# Patient Record
Sex: Female | Born: 1955
Health system: Southern US, Community
[De-identification: ages and names within clinical notes are randomized; demographics above are authoritative.]

## PROBLEM LIST (undated history)

## (undated) DIAGNOSIS — J329 Chronic sinusitis, unspecified: Secondary | ICD-10-CM

## (undated) DIAGNOSIS — Z9109 Other allergy status, other than to drugs and biological substances: Secondary | ICD-10-CM

## (undated) DIAGNOSIS — K219 Gastro-esophageal reflux disease without esophagitis: Secondary | ICD-10-CM

## (undated) DIAGNOSIS — G43909 Migraine, unspecified, not intractable, without status migrainosus: Secondary | ICD-10-CM

## (undated) DIAGNOSIS — I10 Essential (primary) hypertension: Secondary | ICD-10-CM

## (undated) DIAGNOSIS — J45909 Unspecified asthma, uncomplicated: Secondary | ICD-10-CM

## (undated) HISTORY — DX: Gastro-esophageal reflux disease without esophagitis: K21.9

## (undated) HISTORY — DX: Chronic sinusitis, unspecified: J32.9

## (undated) HISTORY — DX: Essential (primary) hypertension: I10

## (undated) HISTORY — DX: Migraine, unspecified, not intractable, without status migrainosus: G43.909

## (undated) HISTORY — DX: Unspecified asthma, uncomplicated: J45.909

## (undated) HISTORY — DX: Other allergy status, other than to drugs and biological substances: Z91.09

---
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
## 1985-09-14 HISTORY — PX: AUGMENTATION MAMMAPLASTY: SUR837

## 2008-05-31 ENCOUNTER — Ambulatory Visit: Payer: Self-pay | Admitting: Internal Medicine

## 2009-04-14 ENCOUNTER — Ambulatory Visit: Payer: Self-pay | Admitting: Internal Medicine

## 2013-05-08 ENCOUNTER — Ambulatory Visit: Payer: Self-pay | Admitting: Internal Medicine

## 2014-09-21 ENCOUNTER — Ambulatory Visit: Payer: Self-pay | Admitting: Gastroenterology

## 2015-10-21 ENCOUNTER — Other Ambulatory Visit: Payer: Self-pay | Admitting: Nurse Practitioner

## 2015-10-21 DIAGNOSIS — Z1231 Encounter for screening mammogram for malignant neoplasm of breast: Secondary | ICD-10-CM

## 2015-12-02 ENCOUNTER — Ambulatory Visit: Payer: Self-pay | Attending: Nurse Practitioner

## 2015-12-17 ENCOUNTER — Other Ambulatory Visit: Payer: Self-pay | Admitting: Nurse Practitioner

## 2015-12-17 ENCOUNTER — Ambulatory Visit
Admission: RE | Admit: 2015-12-17 | Discharge: 2015-12-17 | Disposition: A | Discharge status: Home / Self Care | Payer: BLUE CROSS/BLUE SHIELD | Source: Ambulatory Visit | Attending: Nurse Practitioner | Admitting: Nurse Practitioner

## 2015-12-17 DIAGNOSIS — Z9882 Breast implant status: Secondary | ICD-10-CM | POA: Insufficient documentation

## 2015-12-17 DIAGNOSIS — Z1231 Encounter for screening mammogram for malignant neoplasm of breast: Secondary | ICD-10-CM | POA: Diagnosis not present

## 2016-01-09 DIAGNOSIS — J029 Acute pharyngitis, unspecified: Secondary | ICD-10-CM | POA: Diagnosis not present

## 2016-01-09 DIAGNOSIS — J019 Acute sinusitis, unspecified: Secondary | ICD-10-CM | POA: Diagnosis not present

## 2016-01-09 DIAGNOSIS — J209 Acute bronchitis, unspecified: Secondary | ICD-10-CM | POA: Diagnosis not present

## 2016-01-20 DIAGNOSIS — R1013 Epigastric pain: Secondary | ICD-10-CM | POA: Diagnosis not present

## 2016-02-25 DIAGNOSIS — R1013 Epigastric pain: Secondary | ICD-10-CM | POA: Diagnosis not present

## 2016-04-10 DIAGNOSIS — R1011 Right upper quadrant pain: Secondary | ICD-10-CM | POA: Diagnosis not present

## 2016-04-10 DIAGNOSIS — K219 Gastro-esophageal reflux disease without esophagitis: Secondary | ICD-10-CM | POA: Diagnosis not present

## 2016-04-24 DIAGNOSIS — M26602 Left temporomandibular joint disorder, unspecified: Secondary | ICD-10-CM | POA: Diagnosis not present

## 2016-05-13 DIAGNOSIS — H6093 Unspecified otitis externa, bilateral: Secondary | ICD-10-CM | POA: Diagnosis not present

## 2016-05-13 DIAGNOSIS — J019 Acute sinusitis, unspecified: Secondary | ICD-10-CM | POA: Diagnosis not present

## 2016-05-13 DIAGNOSIS — J309 Allergic rhinitis, unspecified: Secondary | ICD-10-CM | POA: Diagnosis not present

## 2016-05-28 DIAGNOSIS — H6093 Unspecified otitis externa, bilateral: Secondary | ICD-10-CM | POA: Diagnosis not present

## 2016-05-28 DIAGNOSIS — J309 Allergic rhinitis, unspecified: Secondary | ICD-10-CM | POA: Diagnosis not present

## 2016-05-28 DIAGNOSIS — J019 Acute sinusitis, unspecified: Secondary | ICD-10-CM | POA: Diagnosis not present

## 2016-06-15 DIAGNOSIS — D225 Melanocytic nevi of trunk: Secondary | ICD-10-CM | POA: Diagnosis not present

## 2016-06-15 DIAGNOSIS — D485 Neoplasm of uncertain behavior of skin: Secondary | ICD-10-CM | POA: Diagnosis not present

## 2016-06-15 DIAGNOSIS — C44519 Basal cell carcinoma of skin of other part of trunk: Secondary | ICD-10-CM | POA: Diagnosis not present

## 2016-07-04 DIAGNOSIS — J0100 Acute maxillary sinusitis, unspecified: Secondary | ICD-10-CM | POA: Diagnosis not present

## 2016-08-11 DIAGNOSIS — C44519 Basal cell carcinoma of skin of other part of trunk: Secondary | ICD-10-CM | POA: Diagnosis not present

## 2016-08-11 HISTORY — PX: OTHER SURGICAL HISTORY: SHX169

## 2016-08-11 HISTORY — PX: TUBAL LIGATION: SHX77

## 2016-08-20 DIAGNOSIS — Z0001 Encounter for general adult medical examination with abnormal findings: Secondary | ICD-10-CM | POA: Diagnosis not present

## 2016-08-20 DIAGNOSIS — G4700 Insomnia, unspecified: Secondary | ICD-10-CM | POA: Diagnosis not present

## 2016-08-20 DIAGNOSIS — J309 Allergic rhinitis, unspecified: Secondary | ICD-10-CM | POA: Diagnosis not present

## 2016-08-20 DIAGNOSIS — K219 Gastro-esophageal reflux disease without esophagitis: Secondary | ICD-10-CM | POA: Diagnosis not present

## 2016-08-21 ENCOUNTER — Other Ambulatory Visit: Payer: Self-pay | Admitting: Nurse Practitioner

## 2016-08-21 DIAGNOSIS — Z1231 Encounter for screening mammogram for malignant neoplasm of breast: Secondary | ICD-10-CM

## 2016-10-10 DIAGNOSIS — J029 Acute pharyngitis, unspecified: Secondary | ICD-10-CM | POA: Diagnosis not present

## 2016-10-15 DIAGNOSIS — H43812 Vitreous degeneration, left eye: Secondary | ICD-10-CM | POA: Diagnosis not present

## 2016-12-15 DIAGNOSIS — Z85828 Personal history of other malignant neoplasm of skin: Secondary | ICD-10-CM | POA: Diagnosis not present

## 2016-12-15 DIAGNOSIS — L820 Inflamed seborrheic keratosis: Secondary | ICD-10-CM | POA: Diagnosis not present

## 2016-12-22 ENCOUNTER — Ambulatory Visit
Admission: RE | Admit: 2016-12-22 | Discharge: 2016-12-22 | Disposition: A | Discharge status: Home / Self Care | Payer: BLUE CROSS/BLUE SHIELD | Source: Ambulatory Visit | Attending: Nurse Practitioner | Admitting: Nurse Practitioner

## 2016-12-22 DIAGNOSIS — Z1231 Encounter for screening mammogram for malignant neoplasm of breast: Secondary | ICD-10-CM | POA: Diagnosis not present

## 2017-02-16 DIAGNOSIS — K219 Gastro-esophageal reflux disease without esophagitis: Secondary | ICD-10-CM | POA: Diagnosis not present

## 2017-02-16 DIAGNOSIS — G4700 Insomnia, unspecified: Secondary | ICD-10-CM | POA: Diagnosis not present

## 2017-02-16 DIAGNOSIS — J309 Allergic rhinitis, unspecified: Secondary | ICD-10-CM | POA: Diagnosis not present

## 2017-09-21 ENCOUNTER — Ambulatory Visit (INDEPENDENT_AMBULATORY_CARE_PROVIDER_SITE_OTHER): Payer: BLUE CROSS/BLUE SHIELD | Admitting: Nurse Practitioner

## 2017-09-21 ENCOUNTER — Encounter: Payer: Self-pay | Admitting: Nurse Practitioner

## 2017-09-21 VITALS — BP 148/80 | HR 74 | Resp 16 | Ht 63.0 in | Wt 183.0 lb

## 2017-09-21 DIAGNOSIS — E559 Vitamin D deficiency, unspecified: Secondary | ICD-10-CM

## 2017-09-21 DIAGNOSIS — J309 Allergic rhinitis, unspecified: Secondary | ICD-10-CM | POA: Insufficient documentation

## 2017-09-21 DIAGNOSIS — J3481 Nasal mucositis (ulcerative): Secondary | ICD-10-CM | POA: Insufficient documentation

## 2017-09-21 DIAGNOSIS — N83201 Unspecified ovarian cyst, right side: Secondary | ICD-10-CM | POA: Insufficient documentation

## 2017-09-21 DIAGNOSIS — Z1239 Encounter for other screening for malignant neoplasm of breast: Secondary | ICD-10-CM

## 2017-09-21 DIAGNOSIS — R300 Dysuria: Secondary | ICD-10-CM | POA: Insufficient documentation

## 2017-09-21 DIAGNOSIS — K219 Gastro-esophageal reflux disease without esophagitis: Secondary | ICD-10-CM | POA: Insufficient documentation

## 2017-09-21 DIAGNOSIS — G4700 Insomnia, unspecified: Secondary | ICD-10-CM | POA: Insufficient documentation

## 2017-09-21 DIAGNOSIS — H6093 Unspecified otitis externa, bilateral: Secondary | ICD-10-CM | POA: Insufficient documentation

## 2017-09-21 DIAGNOSIS — Z0001 Encounter for general adult medical examination with abnormal findings: Secondary | ICD-10-CM | POA: Diagnosis not present

## 2017-09-21 DIAGNOSIS — I679 Cerebrovascular disease, unspecified: Secondary | ICD-10-CM | POA: Insufficient documentation

## 2017-09-21 DIAGNOSIS — B019 Varicella without complication: Secondary | ICD-10-CM | POA: Insufficient documentation

## 2017-09-21 DIAGNOSIS — B009 Herpesviral infection, unspecified: Secondary | ICD-10-CM | POA: Insufficient documentation

## 2017-09-21 DIAGNOSIS — Z124 Encounter for screening for malignant neoplasm of cervix: Secondary | ICD-10-CM

## 2017-09-21 DIAGNOSIS — Z1231 Encounter for screening mammogram for malignant neoplasm of breast: Secondary | ICD-10-CM

## 2017-09-21 DIAGNOSIS — J019 Acute sinusitis, unspecified: Secondary | ICD-10-CM | POA: Insufficient documentation

## 2017-09-21 DIAGNOSIS — R635 Abnormal weight gain: Secondary | ICD-10-CM | POA: Insufficient documentation

## 2017-09-21 DIAGNOSIS — N83202 Unspecified ovarian cyst, left side: Secondary | ICD-10-CM | POA: Insufficient documentation

## 2017-09-21 NOTE — Progress Notes (Signed)
[facility] [place], [place] [address]  Internal MEDICINE  Office Visit Note  Patient Name: Rafael Quesada  [id number]  [id number]  Date of Service: 09/21/2017     Complaints/HPI  The patient is here  For health maintenance exam. Today she states she is doing well and has no complaints.     Current Medication: Outpatient Encounter Medications as of 09/21/2017  Medication Sig  . desloratadine (CLARINEX) 5 MG tablet Take 5 mg by mouth daily.  Marland Kitchen esomeprazole (NEXIUM) 40 MG capsule Take 40 mg by mouth daily at 12 noon.  . fluticasone (FLONASE) 50 MCG/ACT nasal spray Place 1 spray into both nostrils 2 (two) times daily.  . sucralfate (CARAFATE) 1 g tablet Take 1 g by mouth 4 (four) times daily. Before meals and nightly.  . traZODone (DESYREL) 50 MG tablet Take 50 mg by mouth at bedtime as needed for sleep.   No facility-administered encounter medications on file as of 09/21/2017.     Surgical History: Past Surgical History:  Procedure Laterality Date  . AUGMENTATION MAMMAPLASTY Bilateral 1987  . [place]  . skin cancer   08/11/2016   removal  . TUBAL LIGATION  08/11/2016    Medical History: Past Medical History:  Diagnosis Date  . Asthma   . Environmental allergies   . Migraine   . Sinusitis     Family History: Family History  Problem Relation Age of Onset  . Breast cancer Cousin     Social History   Socioeconomic History  . Marital status: Married    Spouse name: Not on file  . Number of children: Not on file  . Years of education: Not on file  . Highest education level: Not on file  Social Needs  . Financial resource strain: Not on file  . Food insecurity - worry: Not on file  . Food insecurity - inability: Not on file  . Transportation needs - medical: Not on file  . Transportation needs - non-medical: Not on file  Occupational History  . Not on file  Tobacco Use  . Smoking status: Never Smoker  . Smokeless tobacco:  Never Used  Substance and Sexual Activity  . Alcohol use: No    Frequency: Never  . Drug use: No  . Sexual activity: Not on file  Other Topics Concern  . Not on file  Social History Narrative  . Not on file      Review of Systems  Constitutional: Negative for activity change, appetite change and fatigue.  HENT: Negative.   Respiratory: Negative for cough, chest tightness and wheezing.   Cardiovascular: Negative for chest pain and palpitations.  Gastrointestinal: Negative for constipation, diarrhea, nausea and vomiting.       Very well controlled acid reflux symptoms. Has figured out some of the triggers and avoids then as much as possible.   Endocrine: Negative.   Genitourinary: Negative.   Musculoskeletal: Negative.   Skin: Negative.   Allergic/Immunologic: Positive for environmental allergies.  Neurological: Negative for weakness and headaches.  Hematological: Negative.   Psychiatric/Behavioral: Negative for sleep disturbance. The patient is not nervous/anxious.     Today's Vitals   09/21/17 0943  BP: (!) 148/80  Pulse: 74  Resp: 16  SpO2: 97%  Weight: 183 lb (83 kg)  Height: 5\' 3"  (1.6 m)    Physical Exam  Constitutional: She is oriented to person, place, and time. She appears well-developed and well-nourished.  HENT:  Head: Normocephalic and atraumatic.  Eyes: Pupils are equal, round, and reactive to light.  Neck: Normal range of motion. Neck supple. No JVD present. Carotid bruit is not present. No thyromegaly present.  Cardiovascular: Normal rate, regular rhythm, normal heart sounds and normal pulses. PMI is not displaced.  Pulmonary/Chest: Effort normal and breath sounds normal. No accessory muscle usage. No respiratory distress. She has no wheezes. Right breast exhibits no inverted nipple, no mass, no nipple discharge, no skin change and no tenderness. Left breast exhibits no inverted nipple, no mass, no nipple discharge, no skin change and no tenderness.  Breasts are symmetrical.  Abdominal: Soft. Normal appearance and bowel sounds are normal. There is no tenderness.  Genitourinary: Rectum normal, vagina normal and uterus normal. Pelvic exam was performed with patient prone. There is no rash, tenderness, lesion or injury on the right labia. There is no rash, tenderness, lesion or injury on the left labia. Cervix exhibits no motion tenderness, no discharge and no friability. Right adnexum displays no mass, no tenderness and no fullness. Left adnexum displays no mass, no tenderness and no fullness.  Musculoskeletal: Normal range of motion.  Neurological: She is alert and oriented to person, place, and time. No cranial nerve deficit.  Skin: Skin is warm and dry.  Psychiatric: She has a normal mood and affect.  Nursing note and vitals reviewed.    Assessment/Plan:    ICD-10-CM   1. Encounter for routine adult health examination with abnormal findings Z00.01 Urinalysis, Routine w reflex microscopic    CBC with Differential/Platelet    Comprehensive metabolic panel    T4, free    TSH    Lipid panel    Pap IG and HPV (high risk) DNA detection  2. Gastro-esophageal reflux disease without esophagitis K21.9   3. Vitamin D deficiency E55.9 Vitamin D 1,25 dihydroxy  4. Screening for breast cancer Z12.31 MM Digital Screening  5. Screening for malignant neoplasm of cervix Z12.4 Pap IG and HPV (high risk) DNA detection   1. Annual wellness visit today. Routine, fasting labs ordered.  2. Continue nexium daily ad sucralfate as needed.  Refills to be provided as needed 3. Lab work ordredto check vitamin d. Will treat deficiency as indicated.  4. Screening mammogram ordered.  5. Pap smear obtained today.  She should follow up in 6 months and sooner if needed. General Counseling: I have discussed the findings of the evaluation and examination with Thayer Headings.  I have also discussed any further diagnostic evaluation that may be needed or ordered today. Jenai  verbalizes understanding of the findings of todays visit. We also reviewed her medications today. she has been encouraged to call the office with any questions or concerns that should arise related to todays visit.  This patient was seen by Leretha Pol, FNP- C in Collaboration with Dr Lavera Guise as a part of collaborative care agreement    Time spent:30 minutes    Dr Lavera Guise Internal medicine

## 2017-09-22 LAB — URINALYSIS, ROUTINE W REFLEX MICROSCOPIC
Bilirubin, UA: NEGATIVE
Glucose, UA: NEGATIVE
KETONES UA: NEGATIVE
LEUKOCYTES UA: NEGATIVE
NITRITE UA: NEGATIVE
PH UA: 7.5 (ref 5.0–7.5)
Protein, UA: NEGATIVE
RBC, UA: NEGATIVE
SPEC GRAV UA: 1.007 (ref 1.005–1.030)
Urobilinogen, Ur: 0.2 mg/dL (ref 0.2–1.0)

## 2017-09-23 LAB — PAP IG AND HPV HIGH-RISK
HPV, high-risk: NEGATIVE
PAP SMEAR COMMENT: 0

## 2017-09-24 ENCOUNTER — Other Ambulatory Visit: Payer: Self-pay | Admitting: Nurse Practitioner

## 2017-09-24 DIAGNOSIS — H109 Unspecified conjunctivitis: Secondary | ICD-10-CM

## 2017-09-24 MED ORDER — POLYMYXIN B-TRIMETHOPRIM 10000-0.1 UNIT/ML-% OP SOLN: 1.0000 [drp] | OPHTHALMIC | 0 refills | Status: AC

## 2017-09-24 NOTE — Progress Notes (Signed)
Sent polytrim eye drops to pharmacy. 1 drop every 4 hours for 7 days for conjunctivitis.

## 2017-09-27 ENCOUNTER — Telehealth: Payer: Self-pay

## 2017-09-27 NOTE — Telephone Encounter (Signed)
Called pt to let her know about polytrim 1 drop every 4 hours for 7 days.

## 2017-09-27 NOTE — Telephone Encounter (Signed)
-----   Message from Ronnell Freshwater, NP sent at 09/24/2017  5:28 PM EST ----- Regarding: RE: possible pink eye  Sent polytrim eye drops to pharmacy. 1 drop every 4 hours for 7 days for conjunctivitis.   ----- Message ----- From: Waynard Edwards Sent: 09/24/2017  12:30 PM To: Ronnell Freshwater, NP Subject: possible pink eye                              Pt thinks she has pink eye and was wondering if you can call her in something.

## 2017-09-28 DIAGNOSIS — E559 Vitamin D deficiency, unspecified: Secondary | ICD-10-CM | POA: Diagnosis not present

## 2017-09-28 DIAGNOSIS — Z0001 Encounter for general adult medical examination with abnormal findings: Secondary | ICD-10-CM | POA: Diagnosis not present

## 2017-10-03 LAB — CBC WITH DIFFERENTIAL/PLATELET
BASOS ABS: 0 10*3/uL (ref 0.0–0.2)
BASOS: 1 %
EOS (ABSOLUTE): 0.1 10*3/uL (ref 0.0–0.4)
Eos: 3 %
Hematocrit: 38.2 % (ref 34.0–46.6)
Hemoglobin: 12.9 g/dL (ref 11.1–15.9)
IMMATURE GRANS (ABS): 0 10*3/uL (ref 0.0–0.1)
IMMATURE GRANULOCYTES: 0 %
LYMPHS: 39 %
Lymphocytes Absolute: 1.6 10*3/uL (ref 0.7–3.1)
MCH: 30.5 pg (ref 26.6–33.0)
MCHC: 33.8 g/dL (ref 31.5–35.7)
MCV: 90 fL (ref 79–97)
MONOS ABS: 0.4 10*3/uL (ref 0.1–0.9)
Monocytes: 11 %
NEUTROS PCT: 46 %
Neutrophils Absolute: 1.9 10*3/uL (ref 1.4–7.0)
Platelets: 218 10*3/uL (ref 150–379)
RBC: 4.23 x10E6/uL (ref 3.77–5.28)
RDW: 14.1 % (ref 12.3–15.4)
WBC: 4.1 10*3/uL (ref 3.4–10.8)

## 2017-10-03 LAB — TSH: TSH: 2.75 u[IU]/mL (ref 0.450–4.500)

## 2017-10-03 LAB — COMPREHENSIVE METABOLIC PANEL
ALT: 16 IU/L (ref 0–32)
AST: 17 IU/L (ref 0–40)
Albumin/Globulin Ratio: 1.8 (ref 1.2–2.2)
Albumin: 4.4 g/dL (ref 3.6–4.8)
Alkaline Phosphatase: 64 IU/L (ref 39–117)
BUN/Creatinine Ratio: 17 (ref 12–28)
BUN: 15 mg/dL (ref 8–27)
Bilirubin Total: 0.4 mg/dL (ref 0.0–1.2)
CALCIUM: 9.3 mg/dL (ref 8.7–10.3)
CO2: 23 mmol/L (ref 20–29)
Chloride: 103 mmol/L (ref 96–106)
Creatinine, Ser: 0.86 mg/dL (ref 0.57–1.00)
GFR calc Af Amer: 83 mL/min/{1.73_m2} (ref >59)
GFR, EST NON AFRICAN AMERICAN: 72 mL/min/{1.73_m2} (ref >59)
GLUCOSE: 97 mg/dL (ref 65–99)
Globulin, Total: 2.5 g/dL (ref 1.5–4.5)
POTASSIUM: 4.5 mmol/L (ref 3.5–5.2)
Sodium: 140 mmol/L (ref 134–144)
TOTAL PROTEIN: 6.9 g/dL (ref 6.0–8.5)

## 2017-10-03 LAB — LIPID PANEL
CHOLESTEROL TOTAL: 204 mg/dL — AB (ref 100–199)
Chol/HDL Ratio: 2.8 ratio (ref 0.0–4.4)
HDL: 73 mg/dL (ref >39)
LDL CALC: 117 mg/dL — AB (ref 0–99)
TRIGLYCERIDES: 68 mg/dL (ref 0–149)
VLDL CHOLESTEROL CAL: 14 mg/dL (ref 5–40)

## 2017-10-03 LAB — VITAMIN D 1,25 DIHYDROXY
Vitamin D 1, 25 (OH)2 Total: 42 pg/mL
Vitamin D3 1, 25 (OH)2: 42 pg/mL

## 2017-10-03 LAB — T4, FREE: Free T4: 1.18 ng/dL (ref 0.82–1.77)

## 2017-10-06 NOTE — Progress Notes (Signed)
Paula [name] let patient know that her labs are good,

## 2017-11-10 ENCOUNTER — Other Ambulatory Visit: Payer: Self-pay

## 2017-11-10 MED ORDER — DESLORATADINE 5 MG PO TABS: 5.0000 mg | ORAL_TABLET | Freq: Every day | ORAL | 3 refills | Status: DC

## 2017-11-11 ENCOUNTER — Other Ambulatory Visit: Payer: Self-pay

## 2017-11-11 MED ORDER — DESLORATADINE 5 MG PO TABS: 5.0000 mg | ORAL_TABLET | Freq: Every day | ORAL | 3 refills | Status: DC

## 2017-12-01 ENCOUNTER — Other Ambulatory Visit: Payer: Self-pay

## 2017-12-01 ENCOUNTER — Encounter: Payer: Self-pay | Admitting: Emergency Medicine

## 2017-12-01 ENCOUNTER — Ambulatory Visit
Admission: EM | Admit: 2017-12-01 | Discharge: 2017-12-01 | Disposition: A | Discharge status: Home / Self Care | Payer: BLUE CROSS/BLUE SHIELD | Attending: Family Medicine | Admitting: Family Medicine

## 2017-12-01 DIAGNOSIS — R197 Diarrhea, unspecified: Secondary | ICD-10-CM

## 2017-12-01 DIAGNOSIS — R5383 Other fatigue: Secondary | ICD-10-CM

## 2017-12-01 DIAGNOSIS — A084 Viral intestinal infection, unspecified: Secondary | ICD-10-CM

## 2017-12-01 LAB — CBC WITH DIFFERENTIAL/PLATELET
BASOS ABS: 0 10*3/uL (ref 0–0.1)
Basophils Relative: 0 %
EOS PCT: 2 %
Eosinophils Absolute: 0.1 10*3/uL (ref 0–0.7)
HEMATOCRIT: 34.7 % — AB (ref 35.0–47.0)
Hemoglobin: 12 g/dL (ref 12.0–16.0)
LYMPHS ABS: 1.7 10*3/uL (ref 1.0–3.6)
LYMPHS PCT: 49 %
MCH: 31 pg (ref 26.0–34.0)
MCHC: 34.5 g/dL (ref 32.0–36.0)
MCV: 89.8 fL (ref 80.0–100.0)
MONO ABS: 0.4 10*3/uL (ref 0.2–0.9)
Monocytes Relative: 10 %
NEUTROS ABS: 1.4 10*3/uL (ref 1.4–6.5)
Neutrophils Relative %: 39 %
Platelets: 183 10*3/uL (ref 150–440)
RBC: 3.86 MIL/uL (ref 3.80–5.20)
RDW: 13.6 % (ref 11.5–14.5)
WBC: 3.6 10*3/uL (ref 3.6–11.0)

## 2017-12-01 LAB — COMPREHENSIVE METABOLIC PANEL
ALT: 38 U/L (ref 14–54)
AST: 33 U/L (ref 15–41)
Albumin: 3.7 g/dL (ref 3.5–5.0)
Alkaline Phosphatase: 60 U/L (ref 38–126)
Anion gap: 9 (ref 5–15)
BILIRUBIN TOTAL: 0.5 mg/dL (ref 0.3–1.2)
BUN: 13 mg/dL (ref 6–20)
CO2: 27 mmol/L (ref 22–32)
CREATININE: 0.78 mg/dL (ref 0.44–1.00)
Calcium: 8.7 mg/dL — ABNORMAL LOW (ref 8.9–10.3)
Chloride: 104 mmol/L (ref 101–111)
Glucose, Bld: 105 mg/dL — ABNORMAL HIGH (ref 65–99)
POTASSIUM: 4.2 mmol/L (ref 3.5–5.1)
Sodium: 140 mmol/L (ref 135–145)
TOTAL PROTEIN: 6.8 g/dL (ref 6.5–8.1)

## 2017-12-01 NOTE — ED Triage Notes (Signed)
Patient in today c/o fatigue, no energy, tired. Patient had diarrhea Friday and Saturday. Patient had chills over the weekend. Patient states she has slight stiffness in her neck and abdominal pain.

## 2017-12-01 NOTE — ED Provider Notes (Signed)
[facility]    CSN: [id number] Arrival date & time: 12/01/17  1512     History   Chief Complaint Chief Complaint  Patient presents with  . Fatigue    HPI Paula [name] is a 62 y.o. female.   62 yo female with a c/o fatigue, low energy, after 3 days of watery diarrhea, chills, and crampy abdominal pain. States diarrhea resolved yesterday. Denies any vomiting, melena, hematochezia.    The history is provided by the patient.    Past Medical History:  Diagnosis Date  . Asthma   . Environmental allergies   . Migraine   . Sinusitis     Patient Active Problem List   Diagnosis Date Noted  . Acute sinusitis, unspecified 09/21/2017  . Unspecified otitis externa, bilateral 09/21/2017  . Gastro-esophageal reflux disease without esophagitis 09/21/2017  . Insomnia, unspecified 09/21/2017  . Unspecified ovarian cyst, right side 09/21/2017  . Unspecified ovarian cyst, left side 09/21/2017  . Nasal mucositis (ulcerative) 09/21/2017  . Allergic rhinitis, unspecified 09/21/2017  . Cerebrovascular disease, unspecified 09/21/2017  . Herpesviral infection, unspecified 09/21/2017  . Varicella without complication 16/60/6301  . Dysuria 09/21/2017  . Abnormal weight gain 09/21/2017    Past Surgical History:  Procedure Laterality Date  . AUGMENTATION MAMMAPLASTY Bilateral 1987  . [place]  . skin cancer   08/11/2016   removal  . TUBAL LIGATION  08/11/2016    OB History    No data available       Home Medications    Prior to Admission medications   Medication Sig Start Date End Date Taking? Authorizing Provider  esomeprazole (NEXIUM) 40 MG capsule Take 40 mg by mouth daily at 12 noon.   Yes [provider]  sucralfate (CARAFATE) 1 g tablet Take 1 g by mouth 4 (four) times daily. Before meals and nightly.   Yes [provider]  traZODone (DESYREL) 50 MG tablet Take 50 mg by mouth at bedtime as needed for sleep.   Yes [provider]  desloratadine (CLARINEX) 5 MG tablet Take 1 tablet (5 mg total) by mouth daily. 11/11/17   Ronnell Freshwater, NP  fluticasone (FLONASE) 50 MCG/ACT nasal spray Place 1 spray into both nostrils 2 (two) times daily.    [provider]    Family History Family History  Problem Relation Age of Onset  . Breast cancer Cousin   . COPD Mother   . Ulcers Father     Social History Social History   Tobacco Use  . Smoking status: Former Smoker    Last attempt to quit: 12/01/2004    Years since quitting: 13.0  . Smokeless tobacco: Never Used  Substance Use Topics  . Alcohol use: No    Frequency: Never  . Drug use: No     Allergies   Patient has no known allergies.   Review of Systems Review of Systems   Physical Exam Triage Vital Signs ED Triage Vitals [12/01/17 1535]  Enc Vitals Group     BP (!) 158/65     Pulse Rate 68     Resp 16     Temp 97.9 F (36.6 C)     Temp Source Oral     SpO2 98 %     Weight 180 lb (81.6 kg)     Height 5\' 3"  (1.6 m)     Head Circumference      Peak Flow      Pain Score 0  Pain Loc      Pain Edu?      Excl. in [place]?    No data found.  Updated Vital Signs BP (!) 158/65 (BP Location: Left Arm)   Pulse 68   Temp 97.9 F (36.6 C) (Oral)   Resp 16   Ht 5\' 3"  (1.6 m)   Wt 180 lb (81.6 kg)   SpO2 98%   BMI 31.89 kg/m   Visual Acuity Right Eye Distance:   Left Eye Distance:   Bilateral Distance:    Right Eye Near:   Left Eye Near:    Bilateral Near:     Physical Exam  Constitutional: She appears well-developed and well-nourished. No distress.  Abdominal: Soft. Bowel sounds are normal. She exhibits no distension and no mass. There is tenderness (mild, diffuse). There is no rebound and no guarding.  Skin: She is not diaphoretic.  Nursing note and vitals reviewed.    UC Treatments / Results  Labs (all labs ordered are listed, but only abnormal results are displayed) Labs Reviewed  CBC WITH  DIFFERENTIAL/PLATELET - Abnormal; Notable for the following components:      Result Value   HCT 34.7 (*)    All other components within normal limits  COMPREHENSIVE METABOLIC PANEL - Abnormal; Notable for the following components:   Glucose, Bld 105 (*)    Calcium 8.7 (*)    All other components within normal limits    EKG  EKG Interpretation None       Radiology No results found.  Procedures Procedures (including critical care time)  Medications Ordered in UC Medications - No data to display   Initial Impression / Assessment and Plan / UC Course  I have reviewed the triage vital signs and the nursing notes.  Pertinent labs & imaging results that were available during my care of the patient were reviewed by me and considered in my medical decision making (see chart for details).       Final Clinical Impressions(s) / UC Diagnoses   Final diagnoses:  Viral gastroenteritis    ED Discharge Orders    None     1. Lab results and diagnosis reviewed with patient 2. Recommend supportive treatment with increased fluids/clear liquids then advance slowly as tolerated; otc meds 3. Follow-up prn if symptoms worsen or don't improve   Controlled Substance Prescriptions [place] Controlled Substance Registry consulted? Not Applicable   Norval Gable, MD 12/01/17 1739

## 2018-02-07 DIAGNOSIS — J0100 Acute maxillary sinusitis, unspecified: Secondary | ICD-10-CM | POA: Diagnosis not present

## 2018-02-18 ENCOUNTER — Other Ambulatory Visit: Payer: Self-pay

## 2018-02-18 MED ORDER — TRAZODONE HCL 50 MG PO TABS: 50.0000 mg | ORAL_TABLET | Freq: Every evening | ORAL | 3 refills | Status: DC | PRN

## 2018-05-19 ENCOUNTER — Other Ambulatory Visit: Payer: Self-pay | Admitting: Nurse Practitioner

## 2018-05-19 ENCOUNTER — Telehealth: Payer: Self-pay

## 2018-05-19 DIAGNOSIS — N390 Urinary tract infection, site not specified: Secondary | ICD-10-CM

## 2018-05-19 DIAGNOSIS — R300 Dysuria: Secondary | ICD-10-CM

## 2018-05-19 MED ORDER — PHENAZOPYRIDINE HCL 200 MG PO TABS: 200.0000 mg | ORAL_TABLET | Freq: Three times a day (TID) | ORAL | 0 refills | Status: DC | PRN

## 2018-05-19 MED ORDER — CIPROFLOXACIN HCL 500 MG PO TABS: 500.0000 mg | ORAL_TABLET | Freq: Two times a day (BID) | ORAL | 0 refills | Status: DC

## 2018-05-19 NOTE — Telephone Encounter (Signed)
Pt advised we send antibiotic and pyridium to [facility]

## 2018-05-19 NOTE — Progress Notes (Signed)
Due to c/o uti, sent prescriptions for cipro twice daily for 10 days and pyridium 200mg  which may be taken up to3 times daily as needed for bladder pain/spasms. Both sent to [facility].

## 2018-05-19 NOTE — Telephone Encounter (Signed)
Due to c/o uti, sent prescriptions for cipro twice daily for 10 days and pyridium 200mg  which may be taken up to3 times daily as needed for bladder pain/spasms. Both sent to [facility].

## 2018-08-18 ENCOUNTER — Ambulatory Visit: Payer: BLUE CROSS/BLUE SHIELD | Admitting: Nurse Practitioner

## 2018-08-18 ENCOUNTER — Encounter: Payer: Self-pay | Admitting: Adult Health

## 2018-08-18 VITALS — BP 145/68 | HR 86 | Temp 97.9°F | Resp 16 | Ht 63.0 in | Wt 185.8 lb

## 2018-08-18 DIAGNOSIS — J069 Acute upper respiratory infection, unspecified: Secondary | ICD-10-CM | POA: Diagnosis not present

## 2018-08-18 MED ORDER — AMOXICILLIN 875 MG PO TABS: 875.0000 mg | ORAL_TABLET | Freq: Two times a day (BID) | ORAL | 0 refills | Status: DC

## 2018-08-18 NOTE — Progress Notes (Signed)
[facility] [place], [place] [address]  Internal MEDICINE  Office Visit Note  Patient Name: Paula [name]  [id number]  [id number]  Date of Service: 08/24/2018   Pt is here for a sick visit.  Chief Complaint  Patient presents with  . Ear Problem    Pain in ears nose, throat, symptoms started on sunday  . Sinusitis  . Cough  . Chills     The patient is here fr sick visit. Today, she is complaining of nasal congestion, headache, sore throat, cough, and ear pain. Symptoms have been present for about a week. She has been using over the counter medications without improvement. She has reduced appetite and nausea with no vomiting.        Current Medication:  Outpatient Encounter Medications as of 08/18/2018  Medication Sig  . desloratadine (CLARINEX) 5 MG tablet Take 1 tablet (5 mg total) by mouth daily.  Marland Kitchen esomeprazole (NEXIUM) 40 MG capsule Take 40 mg by mouth daily at 12 noon.  . fluticasone (FLONASE) 50 MCG/ACT nasal spray Place 1 spray into both nostrils 2 (two) times daily.  . phenazopyridine (PYRIDIUM) 200 MG tablet Take 1 tablet (200 mg total) by mouth 3 (three) times daily as needed for pain.  Marland Kitchen sucralfate (CARAFATE) 1 g tablet Take 1 g by mouth 4 (four) times daily. Before meals and nightly.  . traZODone (DESYREL) 50 MG tablet Take 1 tablet (50 mg total) by mouth at bedtime as needed for sleep.  Marland Kitchen amoxicillin (AMOXIL) 875 MG tablet Take 1 tablet (875 mg total) by mouth 2 (two) times daily.  . ciprofloxacin (CIPRO) 500 MG tablet Take 1 tablet (500 mg total) by mouth 2 (two) times daily. (Patient not taking: Reported on 08/18/2018)   No facility-administered encounter medications on file as of 08/18/2018.       Medical History: Past Medical History:  Diagnosis Date  . Asthma   . Environmental allergies   . Migraine   . Sinusitis      Today's Vitals   08/18/18 1213  BP: (!) 145/68  Pulse: 86  Resp: 16  Temp: 97.9 F (36.6 C)   SpO2: 95%  Weight: 185 lb 12.8 oz (84.3 kg)  Height: 5\' 3"  (1.6 m)    Review of Systems  Constitutional: Positive for chills, fatigue and fever.  HENT: Positive for congestion, ear pain, postnasal drip, rhinorrhea, sinus pain, sore throat and voice change.   Respiratory: Positive for cough. Negative for wheezing.   Cardiovascular: Negative for chest pain and palpitations.  Gastrointestinal: Positive for nausea.  Musculoskeletal: Positive for myalgias.  Allergic/Immunologic: Positive for environmental allergies.  Neurological: Positive for headaches.    Physical Exam  Constitutional: She is oriented to person, place, and time. She appears well-developed and well-nourished. No distress.  HENT:  Head: Normocephalic and atraumatic.  Right Ear: Tympanic membrane is erythematous and bulging.  Left Ear: Tympanic membrane is erythematous and bulging.  Nose: Rhinorrhea present. Right sinus exhibits maxillary sinus tenderness and frontal sinus tenderness. Left sinus exhibits maxillary sinus tenderness and frontal sinus tenderness.  Mouth/Throat: Posterior oropharyngeal edema and posterior oropharyngeal erythema present. No oropharyngeal exudate.  Eyes: Pupils are equal, round, and reactive to light. EOM are normal.  Neck: Normal range of motion. Neck supple. No JVD present. No tracheal deviation present. No thyromegaly present.  Cardiovascular: Normal rate, regular rhythm and normal heart sounds. Exam reveals no gallop and no friction rub.  No murmur heard. Pulmonary/Chest: Effort normal and breath sounds  normal. No respiratory distress. She has no wheezes. She has no rales. She exhibits no tenderness.  Abdominal: Soft. Bowel sounds are normal. There is no tenderness.  Musculoskeletal: Normal range of motion.  Lymphadenopathy:    She has cervical adenopathy.  Neurological: She is alert and oriented to person, place, and time. No cranial nerve deficit.  Skin: Skin is warm and dry. She is not  diaphoretic.  Psychiatric: She has a normal mood and affect. Her behavior is normal. Judgment and thought content normal.  Nursing note and vitals reviewed.  Assessment/Plan: 1. Acute upper respiratory infection Start amoxicillin 875mg  twice daily for 10 days. Rest and increase fluids. Continue taking OTC medication as needed and as indicated to alleviate symptoms  - amoxicillin (AMOXIL) 875 MG tablet; Take 1 tablet (875 mg total) by mouth 2 (two) times daily.  Dispense: 20 tablet; Refill: 0  General Counseling: Kerline verbalizes understanding of the findings of todays visit and agrees with plan of treatment. I have discussed any further diagnostic evaluation that may be needed or ordered today. We also reviewed her medications today. she has been encouraged to call the office with any questions or concerns that should arise related to todays visit.    Counseling:  Rest and increase fluids. Continue using OTC medication to control symptoms.   This patient was seen by [place] with Dr Lavera Guise as a part of collaborative care agreement  Meds ordered this encounter  Medications  . amoxicillin (AMOXIL) 875 MG tablet    Sig: Take 1 tablet (875 mg total) by mouth 2 (two) times daily.    Dispense:  20 tablet    Refill:  0    Order Specific Question:   Supervising Provider    Answer:   Lavera Guise [[id number]]    Time spent: 15 Minutes

## 2018-08-24 DIAGNOSIS — J069 Acute upper respiratory infection, unspecified: Secondary | ICD-10-CM | POA: Insufficient documentation

## 2018-09-22 ENCOUNTER — Ambulatory Visit (INDEPENDENT_AMBULATORY_CARE_PROVIDER_SITE_OTHER): Payer: BLUE CROSS/BLUE SHIELD | Admitting: Nurse Practitioner

## 2018-09-22 ENCOUNTER — Encounter: Payer: Self-pay | Admitting: Nurse Practitioner

## 2018-09-22 VITALS — BP 132/82 | HR 65 | Resp 16 | Ht 63.0 in | Wt 185.8 lb

## 2018-09-22 DIAGNOSIS — F5101 Primary insomnia: Secondary | ICD-10-CM

## 2018-09-22 DIAGNOSIS — Z0001 Encounter for general adult medical examination with abnormal findings: Secondary | ICD-10-CM | POA: Diagnosis not present

## 2018-09-22 DIAGNOSIS — R300 Dysuria: Secondary | ICD-10-CM

## 2018-09-22 DIAGNOSIS — Z85820 Personal history of malignant melanoma of skin: Secondary | ICD-10-CM

## 2018-09-22 DIAGNOSIS — J309 Allergic rhinitis, unspecified: Secondary | ICD-10-CM

## 2018-09-22 DIAGNOSIS — K219 Gastro-esophageal reflux disease without esophagitis: Secondary | ICD-10-CM

## 2018-09-22 DIAGNOSIS — R5383 Other fatigue: Secondary | ICD-10-CM

## 2018-09-22 DIAGNOSIS — G4489 Other headache syndrome: Secondary | ICD-10-CM

## 2018-09-22 DIAGNOSIS — Z1239 Encounter for other screening for malignant neoplasm of breast: Secondary | ICD-10-CM

## 2018-09-22 DIAGNOSIS — E559 Vitamin D deficiency, unspecified: Secondary | ICD-10-CM

## 2018-09-22 DIAGNOSIS — B001 Herpesviral vesicular dermatitis: Secondary | ICD-10-CM

## 2018-09-22 MED ORDER — ACYCLOVIR 5 % EX OINT: 1.0000 "application " | TOPICAL_OINTMENT | CUTANEOUS | 3 refills | Status: DC

## 2018-09-22 MED ORDER — ASPIRIN-ACETAMINOPHEN-CAFFEINE 250-250-65 MG PO TABS: 1.0000 | ORAL_TABLET | Freq: Four times a day (QID) | ORAL | 2 refills | Status: DC | PRN

## 2018-09-22 MED ORDER — FLUTICASONE PROPIONATE 50 MCG/ACT NA SUSP: 1.0000 | Freq: Two times a day (BID) | NASAL | 5 refills | Status: DC

## 2018-09-22 MED ORDER — TRAZODONE HCL 50 MG PO TABS: 50.0000 mg | ORAL_TABLET | Freq: Every evening | ORAL | 5 refills | Status: DC | PRN

## 2018-09-22 MED ORDER — SUCRALFATE 1 G PO TABS: 1.0000 g | ORAL_TABLET | Freq: Four times a day (QID) | ORAL | 5 refills | Status: DC

## 2018-09-22 MED ORDER — ACYCLOVIR 400 MG PO TABS: 400.0000 mg | ORAL_TABLET | Freq: Four times a day (QID) | ORAL | 3 refills | Status: DC

## 2018-09-22 MED ORDER — DESLORATADINE 5 MG PO TABS: 5.0000 mg | ORAL_TABLET | Freq: Every day | ORAL | 3 refills | Status: DC

## 2018-09-22 MED ORDER — ESOMEPRAZOLE MAGNESIUM 40 MG PO CPDR: 40.0000 mg | DELAYED_RELEASE_CAPSULE | Freq: Every day | ORAL | 5 refills | Status: DC

## 2018-09-22 NOTE — Progress Notes (Signed)
[facility] [place],  [address]  Internal MEDICINE  Office Visit Note  Patient Name: Paula [name]  [id number]  [id number]  Date of Service: 09/23/2018   Pt is here for routine health maintenance examination   Chief Complaint  Patient presents with  . Annual Exam  . Quality Metric Gaps    pt does not plan on getting the flu vaccine     The patient is here for health maintenance exam. Today, she is feeling well. Does have some pressure in the ears. Recently treated for sinus infection. Symptoms have subsided except for the ear pressure. She is going on a cruise on Saturday and wants to make sure there is no persistent infection. She is due to have routine, fasting labs as well as mammogram. She does need to have refills of all medications .   Current Medication: Outpatient Encounter Medications as of 09/22/2018  Medication Sig  . desloratadine (CLARINEX) 5 MG tablet Take 1 tablet (5 mg total) by mouth daily.  Marland Kitchen esomeprazole (NEXIUM) 40 MG capsule Take 1 capsule (40 mg total) by mouth daily at 12 noon.  . fluticasone (FLONASE) 50 MCG/ACT nasal spray Place 1 spray into both nostrils 2 (two) times daily.  . phenazopyridine (PYRIDIUM) 200 MG tablet Take 1 tablet (200 mg total) by mouth 3 (three) times daily as needed for pain.  Marland Kitchen sucralfate (CARAFATE) 1 g tablet Take 1 tablet (1 g total) by mouth 4 (four) times daily.  . traZODone (DESYREL) 50 MG tablet Take 1 tablet (50 mg total) by mouth at bedtime as needed for sleep.  . [DISCONTINUED] desloratadine (CLARINEX) 5 MG tablet Take 1 tablet (5 mg total) by mouth daily.  . [DISCONTINUED] esomeprazole (NEXIUM) 40 MG capsule Take 40 mg by mouth daily at 12 noon.  . [DISCONTINUED] fluticasone (FLONASE) 50 MCG/ACT nasal spray Place 1 spray into both nostrils 2 (two) times daily.  . [DISCONTINUED] sucralfate (CARAFATE) 1 g tablet Take 1 g by mouth 4 (four) times daily. Before meals and nightly.  .  [DISCONTINUED] traZODone (DESYREL) 50 MG tablet Take 1 tablet (50 mg total) by mouth at bedtime as needed for sleep.  . Acetaminophen 500 MG coapsule Take by mouth.  Marland Kitchen acyclovir (ZOVIRAX) 400 MG tablet Take 1 tablet (400 mg total) by mouth 4 (four) times daily.  Marland Kitchen acyclovir ointment (ZOVIRAX) 5 % Apply 1 application topically every 3 (three) hours.  Marland Kitchen aspirin-acetaminophen-caffeine (EXCEDRIN MIGRAINE) 250-250-65 MG tablet Take 1 tablet by mouth every 6 (six) hours as needed for headache.  . [DISCONTINUED] amoxicillin (AMOXIL) 875 MG tablet Take 1 tablet (875 mg total) by mouth 2 (two) times daily. (Patient not taking: Reported on 09/22/2018)  . [DISCONTINUED] aspirin-acetaminophen-caffeine (EXCEDRIN MIGRAINE) 250-250-65 MG tablet Take by mouth.  . [DISCONTINUED] ciprofloxacin (CIPRO) 500 MG tablet Take 1 tablet (500 mg total) by mouth 2 (two) times daily. (Patient not taking: Reported on 08/18/2018)   No facility-administered encounter medications on file as of 09/22/2018.     Surgical History: Past Surgical History:  Procedure Laterality Date  . AUGMENTATION MAMMAPLASTY Bilateral 1987  . [place]  . skin cancer   08/11/2016   removal  . TUBAL LIGATION  08/11/2016    Medical History: Past Medical History:  Diagnosis Date  . Asthma   . Environmental allergies   . Migraine   . Sinusitis     Family History: Family History  Problem Relation Age of Onset  . Breast cancer Cousin   .  COPD Mother   . Ulcers Father       Review of Systems  Constitutional: Negative for activity change, appetite change and fatigue.  HENT: Positive for congestion.        Ears feel full and congested.   Respiratory: Negative for cough, chest tightness and wheezing.   Cardiovascular: Negative for chest pain and palpitations.  Gastrointestinal: Negative for constipation, diarrhea, nausea and vomiting.       Very well controlled acid reflux symptoms. Has figured out some of the triggers and  avoids then as much as possible.   Endocrine: Negative for cold intolerance, heat intolerance, polydipsia and polyuria.  Genitourinary: Negative for dysuria, frequency and urgency.  Musculoskeletal: Negative for arthralgias and myalgias.  Skin: Negative.   Allergic/Immunologic: Positive for environmental allergies.  Neurological: Negative for dizziness, weakness and headaches.  Hematological: Negative for adenopathy.  Psychiatric/Behavioral: Negative for sleep disturbance. The patient is not nervous/anxious.      Today's Vitals   09/22/18 0839  BP: 132/82  Pulse: 65  Resp: 16  SpO2: 98%  Weight: 185 lb 12.8 oz (84.3 kg)  Height: 5\' 3"  (1.6 m)    Physical Exam Vitals signs and nursing note reviewed.  Constitutional:      Appearance: Normal appearance. She is well-developed.  HENT:     Head: Normocephalic and atraumatic.     Right Ear: Ear canal normal.     Left Ear: Ear canal normal.     Nose: Nose normal.     Mouth/Throat:     Mouth: Mucous membranes are moist.     Pharynx: Oropharynx is clear.  Eyes:     Conjunctiva/sclera: Conjunctivae normal.     Pupils: Pupils are equal, round, and reactive to light.  Neck:     Musculoskeletal: Normal range of motion and neck supple.     Thyroid: No thyromegaly.     Vascular: No carotid bruit or JVD.  Cardiovascular:     Rate and Rhythm: Normal rate and regular rhythm.     Chest Wall: PMI is not displaced.     Pulses: Normal pulses.     Heart sounds: Normal heart sounds.  Pulmonary:     Effort: Pulmonary effort is normal. No accessory muscle usage or respiratory distress.     Breath sounds: Normal breath sounds. No wheezing.  Chest:     Breasts: Breasts are symmetrical.        Right: Normal. No swelling, bleeding, mass, nipple discharge, skin change or tenderness.        Left: Normal. No swelling, bleeding, inverted nipple, mass, nipple discharge, skin change or tenderness.  Abdominal:     General: Bowel sounds are normal.      Palpations: Abdomen is soft.     Tenderness: There is no abdominal tenderness.  Musculoskeletal: Normal range of motion.  Skin:    General: Skin is warm and dry.  Neurological:     General: No focal deficit present.     Mental Status: She is alert and oriented to person, place, and time.     Cranial Nerves: No cranial nerve deficit.  Psychiatric:        Mood and Affect: Mood normal.        Behavior: Behavior normal.        Thought Content: Thought content normal.        Judgment: Judgment normal.      LABS: Recent Results (from the past 2160 hour(s))  UA/M w/rflx Culture, Routine  Status: None   Collection Time: 09/22/18  8:51 AM  Result Value Ref Range   Specific Gravity, UA 1.021 1.005 - 1.030   pH, UA 7.5 5.0 - 7.5   Color, UA Yellow Yellow   Appearance Ur Clear Clear   Leukocytes, UA Negative Negative   Protein, UA Negative Negative/Trace   Glucose, UA Negative Negative   Ketones, UA Negative Negative   RBC, UA Negative Negative   Bilirubin, UA Negative Negative   Urobilinogen, Ur 0.2 0.2 - 1.0 mg/dL   Nitrite, UA Negative Negative   Microscopic Examination Comment     Comment: Microscopic follows if indicated.   Microscopic Examination See below:     Comment: Microscopic was indicated and was performed.   Urinalysis Reflex Comment     Comment: This specimen will not reflex to a Urine Culture.  Microscopic Examination     Status: None   Collection Time: 09/22/18  8:51 AM  Result Value Ref Range   WBC, UA None seen 0 - 5 /hpf   RBC, UA 0-2 0 - 2 /hpf   Epithelial Cells (non renal) 0-10 0 - 10 /hpf   Casts None seen None seen /lpf   Mucus, UA Present Not Estab.   Bacteria, UA None seen None seen/Few    Assessment/Plan: 1. Encounter for routine adult health examination with abnormal findings Annual health maintenance exam today. Routine labs ordered.  - CBC with Differential/Platelet - Comprehensive metabolic panel - T4, free - TSH - Lipid panel  2.  Gastro-esophageal reflux disease without esophagitis Continue nexium every day and use sucralfate as needed.  - esomeprazole (NEXIUM) 40 MG capsule; Take 1 capsule (40 mg total) by mouth daily at 12 noon.  Dispense: 30 capsule; Refill: 5 - sucralfate (CARAFATE) 1 g tablet; Take 1 tablet (1 g total) by mouth 4 (four) times daily.  Dispense: 120 tablet; Refill: 5  3. Allergic rhinitis, unspecified seasonality, unspecified trigger - desloratadine (CLARINEX) 5 MG tablet; Take 1 tablet (5 mg total) by mouth daily.  Dispense: 30 tablet; Refill: 3 - fluticasone (FLONASE) 50 MCG/ACT nasal spray; Place 1 spray into both nostrils 2 (two) times daily.  Dispense: 16 g; Refill: 5  4. Other fatigue Check labs - T4, free - TSH  5. Headache syndrome - aspirin-acetaminophen-caffeine (EXCEDRIN MIGRAINE) 250-250-65 MG tablet; Take 1 tablet by mouth every 6 (six) hours as needed for headache.  Dispense: 45 tablet; Refill: 2  6. Vitamin D deficiency - Vitamin D 1,25 dihydroxy  7. Fever blister When outbreak occurs, start acyclovir 400mg  four times daily for 5 days. May also use acyclovir ointment as needed  - acyclovir ointment (ZOVIRAX) 5 %; Apply 1 application topically every 3 (three) hours.  Dispense: 15 g; Refill: 3 - acyclovir (ZOVIRAX) 400 MG tablet; Take 1 tablet (400 mg total) by mouth 4 (four) times daily.  Dispense: 20 tablet; Refill: 3  8. History of malignant melanoma of skin Refer to dermatology for continued evaluation and treatment.  - Ambulatory referral to Dermatology  9. Primary insomnia Use trazodone as needed and as prescribed  - traZODone (DESYREL) 50 MG tablet; Take 1 tablet (50 mg total) by mouth at bedtime as needed for sleep.  Dispense: 30 tablet; Refill: 5  10. Screening for breast cancer - MM DIGITAL SCREENING BILATERAL; Future  11. Dysuria - UA/M w/rflx Culture, Routine  General Counseling: zarria towell understanding of the findings of todays visit and agrees with  plan of treatment. I have discussed any further  diagnostic evaluation that may be needed or ordered today. We also reviewed her medications today. she has been encouraged to call the office with any questions or concerns that should arise related to todays visit.    Counseling:  This patient was seen by Leretha Pol FNP Collaboration with Dr Lavera Guise as a part of collaborative care agreement  Orders Placed This Encounter  Procedures  . Microscopic Examination  . MM DIGITAL SCREENING BILATERAL  . UA/M w/rflx Culture, Routine  . CBC with Differential/Platelet  . Comprehensive metabolic panel  . T4, free  . TSH  . Lipid panel  . Vitamin D 1,25 dihydroxy  . Ambulatory referral to Dermatology    Meds ordered this encounter  Medications  . aspirin-acetaminophen-caffeine (EXCEDRIN MIGRAINE) 250-250-65 MG tablet    Sig: Take 1 tablet by mouth every 6 (six) hours as needed for headache.    Dispense:  45 tablet    Refill:  2    Order Specific Question:   Supervising Provider    Answer:   Lavera Guise [[id number]]  . desloratadine (CLARINEX) 5 MG tablet    Sig: Take 1 tablet (5 mg total) by mouth daily.    Dispense:  30 tablet    Refill:  3    Order Specific Question:   Supervising Provider    Answer:   Lavera Guise [[id number]]  . esomeprazole (NEXIUM) 40 MG capsule    Sig: Take 1 capsule (40 mg total) by mouth daily at 12 noon.    Dispense:  30 capsule    Refill:  5    Order Specific Question:   Supervising Provider    Answer:   Lavera Guise [[id number]]  . sucralfate (CARAFATE) 1 g tablet    Sig: Take 1 tablet (1 g total) by mouth 4 (four) times daily.    Dispense:  120 tablet    Refill:  5    Order Specific Question:   Supervising Provider    Answer:   Lavera Guise [[id number]]  . traZODone (DESYREL) 50 MG tablet    Sig: Take 1 tablet (50 mg total) by mouth at bedtime as needed for sleep.    Dispense:  30 tablet    Refill:  5    Order Specific Question:   Supervising Provider     Answer:   Lavera Guise [[id number]]  . acyclovir ointment (ZOVIRAX) 5 %    Sig: Apply 1 application topically every 3 (three) hours.    Dispense:  15 g    Refill:  3    Order Specific Question:   Supervising Provider    Answer:   Lavera Guise [[id number]]  . acyclovir (ZOVIRAX) 400 MG tablet    Sig: Take 1 tablet (400 mg total) by mouth 4 (four) times daily.    Dispense:  20 tablet    Refill:  3    Order Specific Question:   Supervising Provider    Answer:   Lavera Guise [[id number]]  . fluticasone (FLONASE) 50 MCG/ACT nasal spray    Sig: Place 1 spray into both nostrils 2 (two) times daily.    Dispense:  16 g    Refill:  5    Order Specific Question:   Supervising Provider    Answer:   Lavera Guise [[id number]]    Time spent: [place], MD  Internal Medicine

## 2018-09-23 DIAGNOSIS — R5383 Other fatigue: Secondary | ICD-10-CM | POA: Insufficient documentation

## 2018-09-23 DIAGNOSIS — Z1239 Encounter for other screening for malignant neoplasm of breast: Secondary | ICD-10-CM | POA: Insufficient documentation

## 2018-09-23 DIAGNOSIS — E559 Vitamin D deficiency, unspecified: Secondary | ICD-10-CM | POA: Insufficient documentation

## 2018-09-23 DIAGNOSIS — B001 Herpesviral vesicular dermatitis: Secondary | ICD-10-CM | POA: Insufficient documentation

## 2018-09-23 DIAGNOSIS — Z85820 Personal history of malignant melanoma of skin: Secondary | ICD-10-CM | POA: Insufficient documentation

## 2018-09-23 DIAGNOSIS — G4489 Other headache syndrome: Secondary | ICD-10-CM | POA: Insufficient documentation

## 2018-09-23 LAB — UA/M W/RFLX CULTURE, ROUTINE
Bilirubin, UA: NEGATIVE
Glucose, UA: NEGATIVE
KETONES UA: NEGATIVE
Leukocytes, UA: NEGATIVE
Nitrite, UA: NEGATIVE
Protein, UA: NEGATIVE
RBC, UA: NEGATIVE
Specific Gravity, UA: 1.021 (ref 1.005–1.030)
UUROB: 0.2 mg/dL (ref 0.2–1.0)
pH, UA: 7.5 (ref 5.0–7.5)

## 2018-09-23 LAB — MICROSCOPIC EXAMINATION
Bacteria, UA: NONE SEEN
Casts: NONE SEEN /lpf
WBC, UA: NONE SEEN /hpf (ref 0–5)

## 2018-10-12 ENCOUNTER — Other Ambulatory Visit: Payer: Self-pay | Admitting: Nurse Practitioner

## 2018-10-12 DIAGNOSIS — R5383 Other fatigue: Secondary | ICD-10-CM | POA: Diagnosis not present

## 2018-10-12 DIAGNOSIS — E559 Vitamin D deficiency, unspecified: Secondary | ICD-10-CM | POA: Diagnosis not present

## 2018-10-12 DIAGNOSIS — Z0001 Encounter for general adult medical examination with abnormal findings: Secondary | ICD-10-CM | POA: Diagnosis not present

## 2018-10-13 LAB — COMPREHENSIVE METABOLIC PANEL
A/G RATIO: 2.1 (ref 1.2–2.2)
ALT: 20 IU/L (ref 0–32)
AST: 18 IU/L (ref 0–40)
Albumin: 4.4 g/dL (ref 3.8–4.8)
Alkaline Phosphatase: 61 IU/L (ref 39–117)
BUN / CREAT RATIO: 14 (ref 12–28)
BUN: 12 mg/dL (ref 8–27)
Bilirubin Total: 0.3 mg/dL (ref 0.0–1.2)
CO2: 22 mmol/L (ref 20–29)
Calcium: 8.9 mg/dL (ref 8.7–10.3)
Chloride: 107 mmol/L — ABNORMAL HIGH (ref 96–106)
Creatinine, Ser: 0.88 mg/dL (ref 0.57–1.00)
GFR calc Af Amer: 80 mL/min/{1.73_m2} (ref >59)
GFR calc non Af Amer: 70 mL/min/{1.73_m2} (ref >59)
Globulin, Total: 2.1 g/dL (ref 1.5–4.5)
Glucose: 100 mg/dL — ABNORMAL HIGH (ref 65–99)
Potassium: 4.1 mmol/L (ref 3.5–5.2)
Sodium: 143 mmol/L (ref 134–144)
Total Protein: 6.5 g/dL (ref 6.0–8.5)

## 2018-10-13 LAB — T3: T3, Total: 141 ng/dL (ref 71–180)

## 2018-10-13 LAB — CBC
Hematocrit: 35.7 % (ref 34.0–46.6)
Hemoglobin: 12.4 g/dL (ref 11.1–15.9)
MCH: 31 pg (ref 26.6–33.0)
MCHC: 34.7 g/dL (ref 31.5–35.7)
MCV: 89 fL (ref 79–97)
Platelets: 218 10*3/uL (ref 150–450)
RBC: 4 x10E6/uL (ref 3.77–5.28)
RDW: 13.1 % (ref 11.7–15.4)
WBC: 3.7 10*3/uL (ref 3.4–10.8)

## 2018-10-13 LAB — LIPID PANEL W/O CHOL/HDL RATIO
CHOLESTEROL TOTAL: 163 mg/dL (ref 100–199)
HDL: 72 mg/dL (ref >39)
LDL Calculated: 81 mg/dL (ref 0–99)
Triglycerides: 49 mg/dL (ref 0–149)
VLDL Cholesterol Cal: 10 mg/dL (ref 5–40)

## 2018-10-13 LAB — T4, FREE: Free T4: 1.24 ng/dL (ref 0.82–1.77)

## 2018-10-13 LAB — VITAMIN D 25 HYDROXY (VIT D DEFICIENCY, FRACTURES): Vit D, 25-Hydroxy: 38 ng/mL (ref 30.0–100.0)

## 2018-10-13 LAB — TSH: TSH: 2.21 u[IU]/mL (ref 0.450–4.500)

## 2018-10-18 ENCOUNTER — Telehealth: Payer: Self-pay

## 2018-10-18 NOTE — Telephone Encounter (Signed)
Informed pt of lab results  

## 2019-03-23 ENCOUNTER — Other Ambulatory Visit: Payer: Self-pay

## 2019-03-23 ENCOUNTER — Encounter: Payer: Self-pay | Admitting: Nurse Practitioner

## 2019-03-23 ENCOUNTER — Ambulatory Visit: Payer: BLUE CROSS/BLUE SHIELD | Admitting: Nurse Practitioner

## 2019-03-23 VITALS — BP 130/80 | HR 80 | Resp 16 | Ht 63.0 in | Wt 191.0 lb

## 2019-03-23 DIAGNOSIS — R635 Abnormal weight gain: Secondary | ICD-10-CM | POA: Diagnosis not present

## 2019-03-23 DIAGNOSIS — K219 Gastro-esophageal reflux disease without esophagitis: Secondary | ICD-10-CM | POA: Diagnosis not present

## 2019-03-23 DIAGNOSIS — R5383 Other fatigue: Secondary | ICD-10-CM

## 2019-03-23 DIAGNOSIS — R1084 Generalized abdominal pain: Secondary | ICD-10-CM

## 2019-03-23 MED ORDER — PANTOPRAZOLE SODIUM 40 MG PO TBEC: 40.0000 mg | DELAYED_RELEASE_TABLET | Freq: Every day | ORAL | 3 refills | Status: DC

## 2019-03-23 NOTE — Addendum Note (Signed)
Addended by: Leretha Pol on: 03/23/2019 10:20 AM   Modules accepted: Orders

## 2019-03-23 NOTE — Progress Notes (Signed)
[facility] [place], [place] [address]  Internal MEDICINE  Office Visit Note  Patient Name: Paula [name]  [id number]  [id number]  Date of Service: 03/23/2019  Chief Complaint  Patient presents with  . Medical Management of Chronic Issues    mole tag on back   . Abdominal Pain    The patient is here for routine follow up exam. Today, she is c/o belly aches off and on. Does have chronic constipation. Will go a few days in between bowel movements. Gets bloated and full quickly. Feels like the belly is moving very quickly, cramping. Will also have times when she has several episodes of loose bowel movements in a row over short period of time. She is also having increased episodes of acid reflux. nexium is really not working for her any longer.  The patient is also concerned about weight gain. She states that she does not eat very much and exercises at least 30 minutes every day. She has gained 5 pounds since she was last here.       Current Medication: Outpatient Encounter Medications as of 03/23/2019  Medication Sig  . Acetaminophen 500 MG coapsule Take by mouth.  Marland Kitchen acyclovir (ZOVIRAX) 400 MG tablet Take 1 tablet (400 mg total) by mouth 4 (four) times daily.  Marland Kitchen acyclovir ointment (ZOVIRAX) 5 % Apply 1 application topically every 3 (three) hours.  Marland Kitchen aspirin-acetaminophen-caffeine (EXCEDRIN MIGRAINE) 250-250-65 MG tablet Take 1 tablet by mouth every 6 (six) hours as needed for headache.  . desloratadine (CLARINEX) 5 MG tablet Take 1 tablet (5 mg total) by mouth daily.  . fluticasone (FLONASE) 50 MCG/ACT nasal spray Place 1 spray into both nostrils 2 (two) times daily.  . traZODone (DESYREL) 50 MG tablet Take 1 tablet (50 mg total) by mouth at bedtime as needed for sleep.  . [DISCONTINUED] esomeprazole (NEXIUM) 40 MG capsule Take 1 capsule (40 mg total) by mouth daily at 12 noon.  . pantoprazole (PROTONIX) 40 MG tablet Take 1 tablet (40 mg total) by mouth daily.   . [DISCONTINUED] phenazopyridine (PYRIDIUM) 200 MG tablet Take 1 tablet (200 mg total) by mouth 3 (three) times daily as needed for pain.  . [DISCONTINUED] sucralfate (CARAFATE) 1 g tablet Take 1 tablet (1 g total) by mouth 4 (four) times daily. (Patient not taking: Reported on 03/23/2019)   No facility-administered encounter medications on file as of 03/23/2019.     Surgical History: Past Surgical History:  Procedure Laterality Date  . AUGMENTATION MAMMAPLASTY Bilateral 1987  . [place]  . skin cancer   08/11/2016   removal  . TUBAL LIGATION  08/11/2016    Medical History: Past Medical History:  Diagnosis Date  . Asthma   . Environmental allergies   . Migraine   . Sinusitis     Family History: Family History  Problem Relation Age of Onset  . Breast cancer Cousin   . COPD Mother   . Ulcers Father     Social History   Socioeconomic History  . Marital status: Married    Spouse name: Not on file  . Number of children: Not on file  . Years of education: Not on file  . Highest education level: Not on file  Occupational History  . Not on file  Social Needs  . Financial resource strain: Not on file  . Food insecurity    Worry: Not on file    Inability: Not on file  . Transportation needs  Medical: Not on file    Non-medical: Not on file  Tobacco Use  . Smoking status: Former Smoker    Quit date: 12/01/2004    Years since quitting: 14.3  . Smokeless tobacco: Never Used  Substance and Sexual Activity  . Alcohol use: No    Frequency: Never  . Drug use: No  . Sexual activity: Not on file  Lifestyle  . Physical activity    Days per week: Not on file    Minutes per session: Not on file  . Stress: Not on file  Relationships  . Social Herbalist on phone: Not on file    Gets together: Not on file    Attends religious service: Not on file    Active member of club or organization: Not on file    Attends meetings of clubs or organizations: Not  on file    Relationship status: Not on file  . Intimate partner violence    Fear of current or ex partner: Not on file    Emotionally abused: Not on file    Physically abused: Not on file    Forced sexual activity: Not on file  Other Topics Concern  . Not on file  Social History Narrative  . Not on file      Review of Systems  Constitutional: Positive for fatigue and unexpected weight change. Negative for chills.       Five pound weight gain since last visit.   HENT: Negative for congestion, postnasal drip, rhinorrhea, sneezing and sore throat. Nosebleeds:     Respiratory: Negative for cough, chest tightness, shortness of breath and wheezing.   Cardiovascular: Negative for chest pain and palpitations.  Gastrointestinal: Positive for abdominal pain and constipation. Negative for diarrhea, nausea and vomiting.       Bloating and generalized abdominal discomdfort.   Endocrine: Negative for heat intolerance, polydipsia and polyuria.  Musculoskeletal: Negative for arthralgias, back pain, joint swelling and neck pain.  Skin: Negative for rash.       Small skin tag/lesion on the left buttock which feels irritated.   Allergic/Immunologic: Negative for environmental allergies.  Neurological: Negative for dizziness, tremors, numbness and headaches.  Hematological: Negative for adenopathy. Does not bruise/bleed easily.  Psychiatric/Behavioral: Negative for behavioral problems (Depression), sleep disturbance and suicidal ideas. The patient is not nervous/anxious.     Today's Vitals   03/23/19 0837  BP: 130/80  Pulse: 80  Resp: 16  SpO2: 95%  Weight: 191 lb (86.6 kg)  Height: 5\' 3"  (1.6 m)   Body mass index is 33.83 kg/m.  Physical Exam Vitals signs and nursing note reviewed.  Constitutional:      General: She is not in acute distress.    Appearance: She is well-developed. She is not diaphoretic.  HENT:     Head: Normocephalic and atraumatic.     Mouth/Throat:     Pharynx: No  oropharyngeal exudate.  Eyes:     Pupils: Pupils are equal, round, and reactive to light.  Neck:     Musculoskeletal: Normal range of motion and neck supple.     Thyroid: No thyromegaly.     Vascular: No JVD.     Trachea: No tracheal deviation.  Cardiovascular:     Rate and Rhythm: Normal rate and regular rhythm.     Heart sounds: Normal heart sounds. No murmur. No friction rub. No gallop.   Pulmonary:     Effort: Pulmonary effort is normal. No respiratory distress.  Breath sounds: Normal breath sounds. No wheezing or rales.  Chest:     Chest wall: No tenderness.  Abdominal:     General: Bowel sounds are normal.     Palpations: Abdomen is soft.     Tenderness: There is abdominal tenderness.     Comments: Mild generalized abdominal pain with palpation.  Musculoskeletal: Normal range of motion.  Lymphadenopathy:     Cervical: No cervical adenopathy.  Skin:    General: Skin is warm and dry.     Comments: Very small irritated hair follicle on left buttock. No evidence of infection or inflammation.   Neurological:     Mental Status: She is alert and oriented to person, place, and time.     Cranial Nerves: No cranial nerve deficit.  Psychiatric:        Behavior: Behavior normal.        Thought Content: Thought content normal.        Judgment: Judgment normal.    Assessment/Plan: 1. Generalized abdominal discomfort Will get abdominal ultrasound for further evaluation.  - US Abdomen Complete; Future  2. Gastro-esophageal reflux disease without esophagitis Change nexium to pantoprazole 40mg  daily. Avoid specific triggers.  - pantoprazole (PROTONIX) 40 MG tablet; Take 1 tablet (40 mg total) by mouth daily.  Dispense: 30 tablet; Refill: 3  3. Abnormal weight gain Check labs.  - TSH + free T4; Future  4. Other fatigue Check labs.  - TSH + free T4; Future  General Counseling: Hazell verbalizes understanding of the findings of todays visit and agrees with plan of treatment.  I have discussed any further diagnostic evaluation that may be needed or ordered today. We also reviewed her medications today. she has been encouraged to call the office with any questions or concerns that should arise related to todays visit.  This patient was seen by Leretha Pol FNP Collaboration with Dr Lavera Guise as a part of collaborative care agreement  Orders Placed This Encounter  Procedures  . US Abdomen Complete  . TSH + free T4    Meds ordered this encounter  Medications  . pantoprazole (PROTONIX) 40 MG tablet    Sig: Take 1 tablet (40 mg total) by mouth daily.    Dispense:  30 tablet    Refill:  3    Order Specific Question:   Supervising Provider    Answer:   Lavera Guise [[id number]]    Time spent: 67 Minutes      Dr Lavera Guise Internal medicine

## 2019-03-31 ENCOUNTER — Ambulatory Visit (INDEPENDENT_AMBULATORY_CARE_PROVIDER_SITE_OTHER): Payer: BLUE CROSS/BLUE SHIELD

## 2019-03-31 ENCOUNTER — Other Ambulatory Visit: Payer: Self-pay

## 2019-03-31 DIAGNOSIS — R1084 Generalized abdominal pain: Secondary | ICD-10-CM | POA: Diagnosis not present

## 2019-03-31 DIAGNOSIS — R5383 Other fatigue: Secondary | ICD-10-CM | POA: Diagnosis not present

## 2019-03-31 DIAGNOSIS — E559 Vitamin D deficiency, unspecified: Secondary | ICD-10-CM | POA: Diagnosis not present

## 2019-03-31 DIAGNOSIS — Z0001 Encounter for general adult medical examination with abnormal findings: Secondary | ICD-10-CM | POA: Diagnosis not present

## 2019-04-04 LAB — COMPREHENSIVE METABOLIC PANEL
A/G RATIO: 2 (ref 1.2–2.2)
ALK PHOS: 66 IU/L (ref 39–117)
ALT: 17 IU/L (ref 0–32)
AST: 16 IU/L (ref 0–40)
Albumin: 4.5 g/dL (ref 3.8–4.8)
BILIRUBIN TOTAL: 0.3 mg/dL (ref 0.0–1.2)
BUN / CREAT RATIO: 14 (ref 12–28)
BUN: 14 mg/dL (ref 8–27)
CO2: 22 mmol/L (ref 20–29)
Calcium: 9.4 mg/dL (ref 8.7–10.3)
Chloride: 104 mmol/L (ref 96–106)
Creatinine, Ser: 0.98 mg/dL (ref 0.57–1.00)
GFR calc Af Amer: 71 mL/min/{1.73_m2} (ref >59)
GFR, EST NON AFRICAN AMERICAN: 61 mL/min/{1.73_m2} (ref >59)
GLUCOSE: 102 mg/dL — AB (ref 65–99)
Globulin, Total: 2.3 g/dL (ref 1.5–4.5)
POTASSIUM: 4.6 mmol/L (ref 3.5–5.2)
SODIUM: 141 mmol/L (ref 134–144)
Total Protein: 6.8 g/dL (ref 6.0–8.5)

## 2019-04-04 LAB — LIPID PANEL
Chol/HDL Ratio: 2.3 ratio (ref 0.0–4.4)
Cholesterol, Total: 171 mg/dL (ref 100–199)
HDL: 75 mg/dL (ref >39)
LDL Calculated: 88 mg/dL (ref 0–99)
Triglycerides: 42 mg/dL (ref 0–149)
VLDL Cholesterol Cal: 8 mg/dL (ref 5–40)

## 2019-04-04 LAB — VITAMIN D 1,25 DIHYDROXY
Vitamin D 1, 25 (OH)2 Total: 67 pg/mL — ABNORMAL HIGH
Vitamin D3 1, 25 (OH)2: 67 pg/mL

## 2019-04-04 LAB — CBC WITH DIFFERENTIAL/PLATELET
BASOS ABS: 0.1 10*3/uL (ref 0.0–0.2)
BASOS: 1 %
EOS (ABSOLUTE): 0.1 10*3/uL (ref 0.0–0.4)
Eos: 3 %
HEMATOCRIT: 38.1 % (ref 34.0–46.6)
Hemoglobin: 12.5 g/dL (ref 11.1–15.9)
IMMATURE GRANS (ABS): 0 10*3/uL (ref 0.0–0.1)
Immature Granulocytes: 0 %
Lymphocytes Absolute: 1.6 10*3/uL (ref 0.7–3.1)
Lymphs: 38 %
MCH: 29.5 pg (ref 26.6–33.0)
MCHC: 32.8 g/dL (ref 31.5–35.7)
MCV: 90 fL (ref 79–97)
Monocytes Absolute: 0.4 10*3/uL (ref 0.1–0.9)
Monocytes: 11 %
NEUTROS ABS: 2 10*3/uL (ref 1.4–7.0)
NEUTROS PCT: 47 %
PLATELETS: 235 10*3/uL (ref 150–450)
RBC: 4.24 x10E6/uL (ref 3.77–5.28)
RDW: 12.8 % (ref 11.7–15.4)
WBC: 4.2 10*3/uL (ref 3.4–10.8)

## 2019-04-04 LAB — T4, FREE: FREE T4: 1.26 ng/dL (ref 0.82–1.77)

## 2019-04-04 LAB — TSH: TSH: 2.1 u[IU]/mL (ref 0.450–4.500)

## 2019-04-06 ENCOUNTER — Other Ambulatory Visit: Payer: Self-pay

## 2019-04-06 ENCOUNTER — Encounter: Payer: Self-pay | Admitting: Nurse Practitioner

## 2019-04-06 ENCOUNTER — Ambulatory Visit (INDEPENDENT_AMBULATORY_CARE_PROVIDER_SITE_OTHER): Payer: BLUE CROSS/BLUE SHIELD | Admitting: Nurse Practitioner

## 2019-04-06 VITALS — BP 154/77 | HR 64 | Resp 16 | Ht 63.0 in | Wt 191.6 lb

## 2019-04-06 DIAGNOSIS — R030 Elevated blood-pressure reading, without diagnosis of hypertension: Secondary | ICD-10-CM | POA: Diagnosis not present

## 2019-04-06 DIAGNOSIS — K219 Gastro-esophageal reflux disease without esophagitis: Secondary | ICD-10-CM | POA: Diagnosis not present

## 2019-04-06 DIAGNOSIS — R1084 Generalized abdominal pain: Secondary | ICD-10-CM

## 2019-04-06 NOTE — Progress Notes (Signed)
[facility] [address], [place] [address]  Internal MEDICINE  Office Visit Note  Patient Name: Paula [name]  [id number]  [id number]  Date of Service: 04/06/2019  Chief Complaint  Patient presents with  . Medical Management of Chronic Issues    follow up  . Labs Only    review labs and ultrasound    The patient is here for follow up visit. she had been having belly aches off and on. Does have chronic constipation. Will go a few days in between bowel movements. Gets bloated and full quickly. Feels like the belly is moving very quickly, cramping. Will also have times when she has several episodes of loose bowel movements in a row over short period of time. She is also having increased episodes of acid reflux. nexium is really not working for her any longer. Was started back on pantoprazole 40mg  daily. She is takingthis at night. States that her symptoms have improved a great deal since starting this medication. She had ultrasound of the abdomen for further evaluation. Shows possible fatty deposits of the liver and borderline enlargement of her spleen. Her labs were done prior to this visit. WBC count was normal. In fact, only mild abnormality was glucose at 102 and vitamin d being slightly over normal level. Today, blood pressure is mildly elevated. This is not normal finding for the patient. She states that she is unusually stressed and has a lot to prepare for this weekend. She denies presence of symptoms or concerns.        Current Medication: Outpatient Encounter Medications as of 04/06/2019  Medication Sig  . Acetaminophen 500 MG coapsule Take by mouth.  Marland Kitchen acyclovir (ZOVIRAX) 400 MG tablet Take 1 tablet (400 mg total) by mouth 4 (four) times daily.  Marland Kitchen acyclovir ointment (ZOVIRAX) 5 % Apply 1 application topically every 3 (three) hours.  Marland Kitchen aspirin-acetaminophen-caffeine (EXCEDRIN MIGRAINE) 250-250-65 MG tablet Take 1 tablet by mouth every 6 (six) hours as needed  for headache.  . desloratadine (CLARINEX) 5 MG tablet Take 1 tablet (5 mg total) by mouth daily.  . fluticasone (FLONASE) 50 MCG/ACT nasal spray Place 1 spray into both nostrils 2 (two) times daily.  . pantoprazole (PROTONIX) 40 MG tablet Take 1 tablet (40 mg total) by mouth daily.  . traZODone (DESYREL) 50 MG tablet Take 1 tablet (50 mg total) by mouth at bedtime as needed for sleep.   No facility-administered encounter medications on file as of 04/06/2019.     Surgical History: Past Surgical History:  Procedure Laterality Date  . AUGMENTATION MAMMAPLASTY Bilateral 1987  . [place]  . skin cancer   08/11/2016   removal  . TUBAL LIGATION  08/11/2016    Medical History: Past Medical History:  Diagnosis Date  . Asthma   . Environmental allergies   . Migraine   . Sinusitis     Family History: Family History  Problem Relation Age of Onset  . Breast cancer Cousin   . COPD Mother   . Ulcers Father     Social History   Socioeconomic History  . Marital status: Married    Spouse name: Not on file  . Number of children: Not on file  . Years of education: Not on file  . Highest education level: Not on file  Occupational History  . Not on file  Social Needs  . Financial resource strain: Not on file  . Food insecurity    Worry: Not on file  Inability: Not on file  . Transportation needs    Medical: Not on file    Non-medical: Not on file  Tobacco Use  . Smoking status: Former Smoker    Quit date: 12/01/2004    Years since quitting: 14.3  . Smokeless tobacco: Never Used  Substance and Sexual Activity  . Alcohol use: No    Frequency: Never  . Drug use: No  . Sexual activity: Not on file  Lifestyle  . Physical activity    Days per week: Not on file    Minutes per session: Not on file  . Stress: Not on file  Relationships  . Social Herbalist on phone: Not on file    Gets together: Not on file    Attends religious service: Not on file     Active member of club or organization: Not on file    Attends meetings of clubs or organizations: Not on file    Relationship status: Not on file  . Intimate partner violence    Fear of current or ex partner: Not on file    Emotionally abused: Not on file    Physically abused: Not on file    Forced sexual activity: Not on file  Other Topics Concern  . Not on file  Social History Narrative  . Not on file      Review of Systems  Constitutional: Positive for fatigue. Negative for chills and unexpected weight change.       Marland Kitchen   HENT: Negative for congestion, postnasal drip, rhinorrhea, sneezing and sore throat. Nosebleeds:     Respiratory: Negative for cough, chest tightness, shortness of breath and wheezing.   Cardiovascular: Negative for chest pain and palpitations.  Gastrointestinal: Negative for abdominal pain, constipation, diarrhea, nausea and vomiting.       Bloating and generalized abdominal discomdfort. This has improved significantly since her last visit.   Endocrine: Negative for cold intolerance, heat intolerance, polydipsia and polyuria.  Musculoskeletal: Negative for arthralgias, back pain, joint swelling and neck pain.  Skin: Negative for rash.  Allergic/Immunologic: Negative for environmental allergies.  Neurological: Negative for dizziness, tremors, numbness and headaches.  Hematological: Negative for adenopathy. Does not bruise/bleed easily.  Psychiatric/Behavioral: Negative for behavioral problems (Depression), sleep disturbance and suicidal ideas. The patient is not nervous/anxious.     Today's Vitals   04/06/19 1034  BP: (!) 154/77  Pulse: 64  Resp: 16  SpO2: 99%  Weight: 191 lb 9.6 oz (86.9 kg)  Height: 5\' 3"  (1.6 m)   Body mass index is 33.94 kg/m.  Physical Exam Vitals signs and nursing note reviewed.  Constitutional:      General: She is not in acute distress.    Appearance: Normal appearance. She is well-developed. She is not diaphoretic.  HENT:      Head: Normocephalic and atraumatic.     Mouth/Throat:     Pharynx: No oropharyngeal exudate.  Eyes:     Pupils: Pupils are equal, round, and reactive to light.  Neck:     Musculoskeletal: Normal range of motion and neck supple.     Thyroid: No thyromegaly.     Vascular: No JVD.     Trachea: No tracheal deviation.  Cardiovascular:     Rate and Rhythm: Normal rate and regular rhythm.     Heart sounds: Normal heart sounds. No murmur. No friction rub. No gallop.   Pulmonary:     Effort: Pulmonary effort is normal. No respiratory distress.  Breath sounds: Normal breath sounds. No wheezing or rales.  Chest:     Chest wall: No tenderness.  Abdominal:     General: Bowel sounds are normal.     Palpations: Abdomen is soft.     Tenderness: There is no abdominal tenderness.     Comments: Mild generalized abdominal pain with palpation.  Musculoskeletal: Normal range of motion.  Lymphadenopathy:     Cervical: No cervical adenopathy.  Skin:    General: Skin is warm and dry.     Comments: Very small irritated hair follicle on left buttock. No evidence of infection or inflammation.   Neurological:     Mental Status: She is alert and oriented to person, place, and time.     Cranial Nerves: No cranial nerve deficit.  Psychiatric:        Behavior: Behavior normal.        Thought Content: Thought content normal.        Judgment: Judgment normal.    Assessment/Plan: 1. Generalized abdominal discomfort Reviewed results of abdominal ultrasound. Has some fatty deposits on the liver and borderline enlargement of spleen. Belly pain has essentially resolved. Will continue to monitor.   2. Gastro-esophageal reflux disease without esophagitis Improved significantly since her last visit. Will have her continue with pantoprazole 40mg  every day.   3. Elevated blood pressure reading in office without diagnosis of hypertension Patient will monitor her blood pressure frequently outside of the  office. Will contact us if it routinely above 140/90.   General Counseling: naylah cork understanding of the findings of todays visit and agrees with plan of treatment. I have discussed any further diagnostic evaluation that may be needed or ordered today. We also reviewed her medications today. she has been encouraged to call the office with any questions or concerns that should arise related to todays visit.  Hypertension Counseling:   The following hypertensive lifestyle modification were recommended and discussed:  1. Limiting alcohol intake to less than 1 oz/day of ethanol:(24 oz of beer or 8 oz of wine or 2 oz of 100-proof whiskey). 2. Take baby ASA 81 mg daily. 3. Importance of regular aerobic exercise and losing weight. 4. Reduce dietary saturated fat and cholesterol intake for overall cardiovascular health. 5. Maintaining adequate dietary potassium, calcium, and magnesium intake. 6. Regular monitoring of the blood pressure. 7. Reduce sodium intake to less than 100 mmol/day (less than 2.3 gm of sodium or less than 6 gm of sodium choride)   This patient was seen by [place] with Dr Lavera Guise as a part of collaborative care agreement   Time spent: 25 Minutes   Dr Lavera Guise Internal medicine

## 2019-04-06 NOTE — Progress Notes (Signed)
Pt blood pressure elevated, informed provider. 

## 2019-04-11 ENCOUNTER — Telehealth: Payer: Self-pay | Admitting: Nurse Practitioner

## 2019-04-11 DIAGNOSIS — I10 Essential (primary) hypertension: Secondary | ICD-10-CM

## 2019-04-11 MED ORDER — LOSARTAN POTASSIUM 25 MG PO TABS: 25.0000 mg | ORAL_TABLET | Freq: Every day | ORAL | 3 refills | Status: DC

## 2019-04-11 NOTE — Telephone Encounter (Signed)
Please let her know I sent prescription for losartan 25mg  daily to her pharmacy.  She should limit salt and increase water in her diet. Continue t monitor her blood pressure closely. Thanks.

## 2019-04-11 NOTE — Telephone Encounter (Signed)
Pt advised  We send losartan to your phar limit salk intake and increase water in her diet and continue monitor

## 2019-04-12 ENCOUNTER — Other Ambulatory Visit: Payer: Self-pay

## 2019-04-12 DIAGNOSIS — I10 Essential (primary) hypertension: Secondary | ICD-10-CM

## 2019-04-12 DIAGNOSIS — R030 Elevated blood-pressure reading, without diagnosis of hypertension: Secondary | ICD-10-CM

## 2019-04-14 ENCOUNTER — Telehealth: Payer: Self-pay

## 2019-04-14 NOTE — Telephone Encounter (Signed)
Pt advised we send labs to [facility] in 3 weeks and make follow after that bing all your reading

## 2019-04-14 NOTE — Telephone Encounter (Signed)
-----   Message from Lavera Guise, MD sent at 04/12/2019 10:09 AM EDT ----- Regarding: new bp medicine After starting her on Losartan, please have her do a metb in 3 weeks and follow up in 3.5 weeks

## 2019-05-01 DIAGNOSIS — I10 Essential (primary) hypertension: Secondary | ICD-10-CM | POA: Diagnosis not present

## 2019-05-02 LAB — BASIC METABOLIC PANEL
BUN/Creatinine Ratio: 15 (ref 12–28)
BUN: 14 mg/dL (ref 8–27)
CO2: 23 mmol/L (ref 20–29)
Calcium: 9.3 mg/dL (ref 8.7–10.3)
Chloride: 103 mmol/L (ref 96–106)
Creatinine, Ser: 0.91 mg/dL (ref 0.57–1.00)
GFR calc Af Amer: 77 mL/min/{1.73_m2} (ref >59)
GFR calc non Af Amer: 67 mL/min/{1.73_m2} (ref >59)
Glucose: 94 mg/dL (ref 65–99)
Potassium: 4.4 mmol/L (ref 3.5–5.2)
Sodium: 140 mmol/L (ref 134–144)

## 2019-05-02 NOTE — Progress Notes (Signed)
Can you let her know that her lab work is perfect. Is she able to tell you how her blood pressure has been?

## 2019-05-03 ENCOUNTER — Telehealth: Payer: Self-pay

## 2019-05-03 NOTE — Progress Notes (Signed)
These pressure readings are much improved. Thank you.

## 2019-05-03 NOTE — Telephone Encounter (Signed)
Pt was informed of her labs per heather they are normal.

## 2019-05-18 ENCOUNTER — Telehealth: Payer: Self-pay

## 2019-05-19 ENCOUNTER — Other Ambulatory Visit: Payer: Self-pay | Admitting: Nurse Practitioner

## 2019-05-19 DIAGNOSIS — I10 Essential (primary) hypertension: Secondary | ICD-10-CM

## 2019-05-19 MED ORDER — AMLODIPINE BESYLATE 2.5 MG PO TABS: 2.5000 mg | ORAL_TABLET | Freq: Every day | ORAL | 3 refills | Status: DC

## 2019-05-19 NOTE — Progress Notes (Signed)
D/c losartan due to negative side effects. Add amlodipine 2.5mg  daily. Can take at same time of day she was taking losartan. Monitor blood pressure closely. Drink plenty of water.

## 2019-05-19 NOTE — Telephone Encounter (Signed)
D/c losartan due to negative side effects. Add amlodipine 2.5mg  daily. Can take at same time of day she was taking losartan. Monitor blood pressure closely. Drink plenty of water.

## 2019-05-19 NOTE — Telephone Encounter (Signed)
Pt advised d/c losartan and we send amlodipine to ph[facility]

## 2019-07-06 ENCOUNTER — Other Ambulatory Visit: Payer: Self-pay

## 2019-07-06 ENCOUNTER — Encounter: Payer: Self-pay | Admitting: Nurse Practitioner

## 2019-07-06 ENCOUNTER — Ambulatory Visit: Payer: BLUE CROSS/BLUE SHIELD | Admitting: Nurse Practitioner

## 2019-07-06 VITALS — BP 153/88 | HR 74 | Temp 98.0°F | Resp 16 | Ht 63.0 in | Wt 186.0 lb

## 2019-07-06 DIAGNOSIS — F411 Generalized anxiety disorder: Secondary | ICD-10-CM

## 2019-07-06 DIAGNOSIS — I10 Essential (primary) hypertension: Secondary | ICD-10-CM | POA: Diagnosis not present

## 2019-07-06 MED ORDER — LORAZEPAM 0.5 MG PO TABS: 0.5000 mg | ORAL_TABLET | Freq: Two times a day (BID) | ORAL | 2 refills | Status: DC | PRN

## 2019-07-06 NOTE — Progress Notes (Signed)
[facility] [place], [place] [address]  Internal MEDICINE  Office Visit Note  Patient Name: Paula [name]  [id number]  [id number]  Date of Service: 07/06/2019  Chief Complaint  Patient presents with  . Hypertension    The patient is here for follow up of blood pressure. Was started on amlodipine 2.5mg  tablets daily. She has been checking her blood pressure twice daily at home. Her readings are between [id number] and [id number] systolic and between 70 and 80 diastolic. She feels good on this medication.  She has mentioned increased levels of anxiety. This seems to make her blood pressure goes up. She is helping her grandchildren remote learn during quarantine from [place]. Sometimes, she can feel stress increase. Would like to try something on as needed basis to help.       Current Medication: Outpatient Encounter Medications as of 07/06/2019  Medication Sig  . Acetaminophen 500 MG coapsule Take by mouth.  Marland Kitchen acyclovir (ZOVIRAX) 400 MG tablet Take 1 tablet (400 mg total) by mouth 4 (four) times daily.  Marland Kitchen acyclovir ointment (ZOVIRAX) 5 % Apply 1 application topically every 3 (three) hours.  Marland Kitchen amLODipine (NORVASC) 2.5 MG tablet Take 1 tablet (2.5 mg total) by mouth daily.  Marland Kitchen aspirin 81 MG chewable tablet Chew 81 mg by mouth daily.  Marland Kitchen aspirin-acetaminophen-caffeine (EXCEDRIN MIGRAINE) 250-250-65 MG tablet Take 1 tablet by mouth every 6 (six) hours as needed for headache.  . desloratadine (CLARINEX) 5 MG tablet Take 1 tablet (5 mg total) by mouth daily.  . fluticasone (FLONASE) 50 MCG/ACT nasal spray Place 1 spray into both nostrils 2 (two) times daily.  . pantoprazole (PROTONIX) 40 MG tablet Take 1 tablet (40 mg total) by mouth daily.  . traZODone (DESYREL) 50 MG tablet Take 1 tablet (50 mg total) by mouth at bedtime as needed for sleep.  Marland Kitchen LORazepam (ATIVAN) 0.5 MG tablet Take 1 tablet (0.5 mg total) by mouth 2 (two) times daily as needed for anxiety.   No  facility-administered encounter medications on file as of 07/06/2019.     Surgical History: Past Surgical History:  Procedure Laterality Date  . AUGMENTATION MAMMAPLASTY Bilateral 1987  . [place]  . skin cancer   08/11/2016   removal  . TUBAL LIGATION  08/11/2016    Medical History: Past Medical History:  Diagnosis Date  . Asthma   . Environmental allergies   . Migraine   . Sinusitis     Family History: Family History  Problem Relation Age of Onset  . Breast cancer Cousin   . COPD Mother   . Ulcers Father     Social History   Socioeconomic History  . Marital status: Married    Spouse name: Not on file  . Number of children: Not on file  . Years of education: Not on file  . Highest education level: Not on file  Occupational History  . Not on file  Social Needs  . Financial resource strain: Not on file  . Food insecurity    Worry: Not on file    Inability: Not on file  . Transportation needs    Medical: Not on file    Non-medical: Not on file  Tobacco Use  . Smoking status: Former Smoker    Quit date: 12/01/2004    Years since quitting: 14.6  . Smokeless tobacco: Never Used  Substance and Sexual Activity  . Alcohol use: Yes    Frequency: Never    Comment:  ocassionally  . Drug use: No  . Sexual activity: Not on file  Lifestyle  . Physical activity    Days per week: Not on file    Minutes per session: Not on file  . Stress: Not on file  Relationships  . Social Herbalist on phone: Not on file    Gets together: Not on file    Attends religious service: Not on file    Active member of club or organization: Not on file    Attends meetings of clubs or organizations: Not on file    Relationship status: Not on file  . Intimate partner violence    Fear of current or ex partner: Not on file    Emotionally abused: Not on file    Physically abused: Not on file    Forced sexual activity: Not on file  Other Topics Concern  . Not on  file  Social History Narrative  . Not on file      Review of Systems  Constitutional: Positive for fatigue. Negative for chills and unexpected weight change.  HENT: Negative for congestion, postnasal drip, rhinorrhea, sneezing and sore throat. Nosebleeds:     Respiratory: Negative for cough, chest tightness, shortness of breath and wheezing.   Cardiovascular: Negative for chest pain and palpitations.       Blood pressure elevated some in the office today. Blood presure log indicates much better readings at home .  Gastrointestinal: Negative for abdominal pain, constipation, diarrhea, nausea and vomiting.  Endocrine: Negative for cold intolerance, heat intolerance, polydipsia and polyuria.  Musculoskeletal: Negative for arthralgias, back pain, joint swelling and neck pain.  Skin: Negative for rash.  Allergic/Immunologic: Negative for environmental allergies.  Neurological: Negative for dizziness, tremors, numbness and headaches.  Hematological: Negative for adenopathy. Does not bruise/bleed easily.  Psychiatric/Behavioral: Positive for dysphoric mood. Negative for behavioral problems (Depression), sleep disturbance and suicidal ideas. The patient is nervous/anxious.     Today's Vitals   07/06/19 1334  BP: (!) 153/88  Pulse: 74  Resp: 16  Temp: 98 F (36.7 C)  SpO2: 95%  Weight: 186 lb (84.4 kg)  Height: 5\' 3"  (1.6 m)   Body mass index is 32.95 kg/m.  Physical Exam Vitals signs and nursing note reviewed.  Constitutional:      General: She is not in acute distress.    Appearance: Normal appearance. She is well-developed. She is not diaphoretic.  HENT:     Head: Normocephalic and atraumatic.     Mouth/Throat:     Pharynx: No oropharyngeal exudate.  Eyes:     Pupils: Pupils are equal, round, and reactive to light.  Neck:     Musculoskeletal: Normal range of motion and neck supple.     Thyroid: No thyromegaly.     Vascular: No JVD.     Trachea: No tracheal deviation.   Cardiovascular:     Rate and Rhythm: Normal rate and regular rhythm.     Heart sounds: Normal heart sounds. No murmur. No friction rub. No gallop.   Pulmonary:     Effort: Pulmonary effort is normal. No respiratory distress.     Breath sounds: Normal breath sounds. No wheezing or rales.  Chest:     Chest wall: No tenderness.  Abdominal:     Palpations: Abdomen is soft.  Musculoskeletal: Normal range of motion.  Lymphadenopathy:     Cervical: No cervical adenopathy.  Skin:    General: Skin is warm and dry.  Neurological:  Mental Status: She is alert and oriented to person, place, and time.     Cranial Nerves: No cranial nerve deficit.  Psychiatric:        Behavior: Behavior normal.        Thought Content: Thought content normal.        Judgment: Judgment normal.    Assessment/Plan: 1. Essential hypertension Blood pressure log indicates improved pressure. Continue current dose amlodipine.   2. Generalized anxiety disorder Add lorazepam 0.5mg  - take 1/2 to 1 tablet po Bid as needed. Reviewed risks and possible side effects associated with taking opiates, benzodiazepines and other CNS depressants. Combination of these could cause dizziness and drowsiness. Advised patient not to drive or operate machinery when taking these medications, as patient's and other's life can be at risk and will have consequences. Patient verbalized understanding in this matter. Dependence and abuse for these drugs will be monitored closely. A Controlled substance policy and procedure is on file which allows [facility] to order a urine drug screen test at any visit. Patient understands and agrees with the plan - LORazepam (ATIVAN) 0.5 MG tablet; Take 1 tablet (0.5 mg total) by mouth 2 (two) times daily as needed for anxiety.  Dispense: 45 tablet; Refill: 2  General Counseling: Samar verbalizes understanding of the findings of todays visit and agrees with plan of treatment. I have discussed any  further diagnostic evaluation that may be needed or ordered today. We also reviewed her medications today. she has been encouraged to call the office with any questions or concerns that should arise related to todays visit.  Hypertension Counseling:   The following hypertensive lifestyle modification were recommended and discussed:  1. Limiting alcohol intake to less than 1 oz/day of ethanol:(24 oz of beer or 8 oz of wine or 2 oz of 100-proof whiskey). 2. Take baby ASA 81 mg daily. 3. Importance of regular aerobic exercise and losing weight. 4. Reduce dietary saturated fat and cholesterol intake for overall cardiovascular health. 5. Maintaining adequate dietary potassium, calcium, and magnesium intake. 6. Regular monitoring of the blood pressure. 7. Reduce sodium intake to less than 100 mmol/day (less than 2.3 gm of sodium or less than 6 gm of sodium choride)   This patient was seen by [place] with Dr Lavera Guise as a part of collaborative care agreement  Meds ordered this encounter  Medications  . LORazepam (ATIVAN) 0.5 MG tablet    Sig: Take 1 tablet (0.5 mg total) by mouth 2 (two) times daily as needed for anxiety.    Dispense:  45 tablet    Refill:  2    Order Specific Question:   Supervising Provider    Answer:   Lavera Guise [id number]    Time spent: 57 Minutes      Dr Lavera Guise Internal medicine

## 2019-07-07 ENCOUNTER — Ambulatory Visit: Payer: BLUE CROSS/BLUE SHIELD | Admitting: Nurse Practitioner

## 2019-07-24 ENCOUNTER — Other Ambulatory Visit: Payer: Self-pay

## 2019-07-24 DIAGNOSIS — K219 Gastro-esophageal reflux disease without esophagitis: Secondary | ICD-10-CM

## 2019-07-24 MED ORDER — PANTOPRAZOLE SODIUM 40 MG PO TBEC: 40.0000 mg | DELAYED_RELEASE_TABLET | Freq: Every day | ORAL | 3 refills | Status: DC

## 2019-09-21 ENCOUNTER — Telehealth: Payer: Self-pay

## 2019-09-21 ENCOUNTER — Other Ambulatory Visit: Payer: Self-pay | Admitting: Nurse Practitioner

## 2019-09-21 DIAGNOSIS — J0110 Acute frontal sinusitis, unspecified: Secondary | ICD-10-CM

## 2019-09-21 DIAGNOSIS — R05 Cough: Secondary | ICD-10-CM

## 2019-09-21 DIAGNOSIS — R059 Cough, unspecified: Secondary | ICD-10-CM

## 2019-09-21 MED ORDER — HYDROCOD POLST-CPM POLST ER 10-8 MG/5ML PO SUER: 5.0000 mL | Freq: Two times a day (BID) | ORAL | 0 refills | Status: DC | PRN

## 2019-09-21 MED ORDER — AZITHROMYCIN 250 MG PO TABS: ORAL_TABLET | ORAL | 0 refills | Status: DC

## 2019-09-21 NOTE — Telephone Encounter (Signed)
Sent in z-pack. Take as prescribed for 5 days. Also tussionex may be taken up to twice daily as needed for cough. She should not drove or work after taking this medication as it may make her very sleepy and dizzy. Both medications were sent to her pharmacy.

## 2019-09-21 NOTE — Progress Notes (Signed)
Sent in z-pack. Take as prescribed for 5 days. Also tussionex may be taken up to twice daily as needed for cough. She should not drove or work after taking this medication as it may make her very sleepy and dizzy. Both medications were sent to her pharmacy.

## 2019-09-21 NOTE — Telephone Encounter (Signed)
CONFIRMED AND SCREENED FOR 09-25-19 OV. 

## 2019-09-25 ENCOUNTER — Encounter: Payer: Self-pay | Admitting: Nurse Practitioner

## 2019-09-25 ENCOUNTER — Other Ambulatory Visit: Payer: Self-pay | Admitting: Nurse Practitioner

## 2019-09-25 ENCOUNTER — Other Ambulatory Visit: Payer: Self-pay

## 2019-09-25 ENCOUNTER — Ambulatory Visit (INDEPENDENT_AMBULATORY_CARE_PROVIDER_SITE_OTHER): Payer: Medicare HMO | Admitting: Nurse Practitioner

## 2019-09-25 VITALS — BP 151/79 | HR 71 | Resp 16 | Ht 63.0 in | Wt 183.4 lb

## 2019-09-25 DIAGNOSIS — R300 Dysuria: Secondary | ICD-10-CM

## 2019-09-25 DIAGNOSIS — Z0001 Encounter for general adult medical examination with abnormal findings: Secondary | ICD-10-CM

## 2019-09-25 DIAGNOSIS — Z85820 Personal history of malignant melanoma of skin: Secondary | ICD-10-CM

## 2019-09-25 DIAGNOSIS — K219 Gastro-esophageal reflux disease without esophagitis: Secondary | ICD-10-CM

## 2019-09-25 DIAGNOSIS — I10 Essential (primary) hypertension: Secondary | ICD-10-CM | POA: Diagnosis not present

## 2019-09-25 DIAGNOSIS — Z124 Encounter for screening for malignant neoplasm of cervix: Secondary | ICD-10-CM | POA: Diagnosis not present

## 2019-09-25 DIAGNOSIS — Z13820 Encounter for screening for osteoporosis: Secondary | ICD-10-CM | POA: Insufficient documentation

## 2019-09-25 DIAGNOSIS — Z1231 Encounter for screening mammogram for malignant neoplasm of breast: Secondary | ICD-10-CM

## 2019-09-25 NOTE — Progress Notes (Signed)
[facility] [place], [place] [address]  Internal MEDICINE  Office Visit Note  Patient Name: Paula [name]  [id number]  [id number]  Date of Service: 09/25/2019  Chief Complaint  Patient presents with  . Medicare Wellness  . Gynecologic Exam  . Hypertension  . Quality Metric Gaps    dexa scan and mammogram     The patient is here for health maintenance exam and pap smear. Was treated last week for upper respiratory infection and cough. Has finished her z-pack this morning. Feels much better. Family members with similar symptoms tested for COVID 19 and were all negative. She does continue to have some post nasal drip and cough. This continues to improve. Her blood pressure is mildly elevated today. She states that she took her blood pressure before leaving home. It was 128/70. She feels good with current medication. She is due to have screening mammogram and bone density test. She does not wish to have flu or pneumonia vaccines at this time.   Pt is here for routine health maintenance examination  Current Medication: Outpatient Encounter Medications as of 09/25/2019  Medication Sig  . Acetaminophen 500 MG coapsule Take by mouth.  Marland Kitchen acyclovir (ZOVIRAX) 400 MG tablet Take 1 tablet (400 mg total) by mouth 4 (four) times daily.  Marland Kitchen acyclovir ointment (ZOVIRAX) 5 % Apply 1 application topically every 3 (three) hours.  Marland Kitchen amLODipine (NORVASC) 2.5 MG tablet Take 1 tablet (2.5 mg total) by mouth daily.  Marland Kitchen aspirin 81 MG chewable tablet Chew 81 mg by mouth daily.  Marland Kitchen aspirin-acetaminophen-caffeine (EXCEDRIN MIGRAINE) 250-250-65 MG tablet Take 1 tablet by mouth every 6 (six) hours as needed for headache.  . chlorpheniramine-HYDROcodone (TUSSIONEX PENNKINETIC ER) 10-8 MG/5ML SUER Take 5 mLs by mouth every 12 (twelve) hours as needed for cough.  . desloratadine (CLARINEX) 5 MG tablet Take 1 tablet (5 mg total) by mouth daily.  . fluticasone (FLONASE) 50 MCG/ACT nasal spray  Place 1 spray into both nostrils 2 (two) times daily.  Marland Kitchen LORazepam (ATIVAN) 0.5 MG tablet Take 1 tablet (0.5 mg total) by mouth 2 (two) times daily as needed for anxiety.  . pantoprazole (PROTONIX) 40 MG tablet Take 1 tablet (40 mg total) by mouth daily.  . traZODone (DESYREL) 50 MG tablet Take 1 tablet (50 mg total) by mouth at bedtime as needed for sleep.  . [DISCONTINUED] azithromycin (ZITHROMAX) 250 MG tablet z-pack - take as directed for 5 days for sinusitis (Patient not taking: Reported on 09/25/2019)   No facility-administered encounter medications on file as of 09/25/2019.    Surgical History: Past Surgical History:  Procedure Laterality Date  . AUGMENTATION MAMMAPLASTY Bilateral 1987  . [place]  . skin cancer   08/11/2016   removal  . TUBAL LIGATION  08/11/2016    Medical History: Past Medical History:  Diagnosis Date  . Asthma   . Environmental allergies   . Migraine   . Sinusitis     Family History: Family History  Problem Relation Age of Onset  . Breast cancer Cousin   . COPD Mother   . Ulcers Father       Review of Systems  Constitutional: Positive for fatigue. Negative for chills and unexpected weight change.  HENT: Negative for congestion, postnasal drip, rhinorrhea, sneezing and sore throat. Nosebleeds:     Respiratory: Negative for cough, chest tightness, shortness of breath and wheezing.   Cardiovascular: Negative for chest pain and palpitations.  Gastrointestinal: Negative for abdominal  pain, constipation, diarrhea, nausea and vomiting.  Endocrine: Negative for cold intolerance, heat intolerance, polydipsia and polyuria.  Genitourinary: Negative for dysuria, frequency, hematuria and urgency.  Musculoskeletal: Negative for arthralgias, back pain, joint swelling and neck pain.  Skin: Negative for rash.  Allergic/Immunologic: Negative for environmental allergies.  Neurological: Negative for dizziness, tremors, numbness and headaches.   Hematological: Negative for adenopathy. Does not bruise/bleed easily.  Psychiatric/Behavioral: Positive for dysphoric mood. Negative for behavioral problems (Depression), sleep disturbance and suicidal ideas. The patient is nervous/anxious.      Today's Vitals   09/25/19 0903  BP: (!) 151/79  Pulse: 71  Resp: 16  SpO2: 95%  Weight: 183 lb 6.4 oz (83.2 kg)  Height: 5\' 3"  (1.6 m)   Body mass index is 32.49 kg/m.  Physical Exam Vitals and nursing note reviewed.  Constitutional:      General: She is not in acute distress.    Appearance: Normal appearance. She is well-developed. She is not diaphoretic.  HENT:     Head: Normocephalic and atraumatic.     Nose: Nose normal.     Mouth/Throat:     Pharynx: No oropharyngeal exudate.  Eyes:     Pupils: Pupils are equal, round, and reactive to light.  Neck:     Thyroid: No thyromegaly.     Vascular: No carotid bruit or JVD.     Trachea: No tracheal deviation.  Cardiovascular:     Rate and Rhythm: Normal rate and regular rhythm.     Pulses: Normal pulses.     Heart sounds: Normal heart sounds. No murmur. No friction rub. No gallop.   Pulmonary:     Effort: Pulmonary effort is normal. No respiratory distress.     Breath sounds: Normal breath sounds. No wheezing or rales.  Chest:     Chest wall: No tenderness.     Breasts:        Right: Normal. No swelling, bleeding, inverted nipple, mass, nipple discharge, skin change or tenderness.        Left: Normal. No swelling, bleeding, inverted nipple, mass, nipple discharge, skin change or tenderness.     Comments: Bilateral breast implants present and intact.  Abdominal:     General: Bowel sounds are normal.     Palpations: Abdomen is soft.     Tenderness: There is no abdominal tenderness.     Hernia: There is no hernia in the left inguinal area or right inguinal area.  Genitourinary:    General: Normal vulva.     Exam position: Supine.     Labia:        Right: No tenderness or  lesion.        Left: No tenderness or lesion.      Vagina: Normal. No vaginal discharge, erythema, tenderness or bleeding.     Cervix: No cervical motion tenderness, discharge, friability or erythema.     Uterus: Normal.      Adnexa: Right adnexa normal and left adnexa normal.       Right: No mass or tenderness.         Left: No mass or tenderness.       Comments: No tenderness, masses, or organomeglay present during bimanual exam . Musculoskeletal:        General: Normal range of motion.     Cervical back: Normal range of motion and neck supple.  Lymphadenopathy:     Cervical: No cervical adenopathy.     Upper Body:     Right  upper body: No axillary adenopathy.     Left upper body: No axillary adenopathy.     Lower Body: No right inguinal adenopathy. No left inguinal adenopathy.  Skin:    General: Skin is warm and dry.  Neurological:     Mental Status: She is alert and oriented to person, place, and time.     Cranial Nerves: No cranial nerve deficit.  Psychiatric:        Mood and Affect: Mood normal.        Behavior: Behavior normal.        Thought Content: Thought content normal.        Judgment: Judgment normal.     Depression screen [facility] 2/9 09/25/2019 07/06/2019 04/06/2019 03/23/2019 09/22/2018  Decreased Interest 0 0 0 0 0  Down, Depressed, Hopeless 0 0 0 0 0  PHQ - 2 Score 0 0 0 0 0    Functional Status Survey: Is the patient deaf or have difficulty hearing?: No Does the patient have difficulty seeing, even when wearing glasses/contacts?: No Does the patient have difficulty concentrating, remembering, or making decisions?: No Does the patient have difficulty walking or climbing stairs?: No Does the patient have difficulty dressing or bathing?: No Does the patient have difficulty doing errands alone such as visiting a doctor's office or shopping?: No  MMSE - Mini Mental State Exam 09/25/2019  Orientation to time 5  Orientation to Place 5  Registration 3  Attention/  Calculation 5  Recall 3  Language- name 2 objects 2  Language- repeat 1  Language- follow 3 step command 3  Language- read & follow direction 1  Write a sentence 1  Copy design 1  Total score 30    Fall Risk  09/25/2019 07/06/2019 04/06/2019 03/23/2019 09/22/2018  Falls in the past year? 0 0 0 0 0     Assessment/Plan: 1. Encounter for routine adult health examination with abnormal findings Annual health maintenance exam and pap smear today.   2. Essential hypertension Stable. Continue bp medication as prescribed   3. Gastro-esophageal reflux disease without esophagitis Stable.   4. History of malignant melanoma of skin Refer to Dermatologist for further evaluation and treatment . - Ambulatory referral to Dermatology  5. Routine cervical smear - IGP, Aptima HPV  6. Screening for osteoporosis - DG Bone Density; Future  7. Encounter for screening mammogram for malignant neoplasm of breast Screening mammogram ordered today  8. Dysuria - UA/M w/rflx Culture, Routine  General Counseling: jericca willers understanding of the findings of todays visit and agrees with plan of treatment. I have discussed any further diagnostic evaluation that may be needed or ordered today. We also reviewed her medications today. she has been encouraged to call the office with any questions or concerns that should arise related to todays visit.    Counseling:  This patient was seen by Leretha Pol FNP Collaboration with Dr Lavera Guise as a part of collaborative care agreement  Orders Placed This Encounter  Procedures  . DG Bone Density  . UA/M w/rflx Culture, Routine  . Ambulatory referral to Dermatology     Total time spent: 45 Minutes  Time spent includes review of chart, medications, test results, and follow up plan with the patient.      Lavera Guise, MD  Internal Medicine

## 2019-09-26 LAB — UA/M W/RFLX CULTURE, ROUTINE
Bilirubin, UA: NEGATIVE
Glucose, UA: NEGATIVE
Ketones, UA: NEGATIVE
Leukocytes,UA: NEGATIVE
Nitrite, UA: NEGATIVE
Protein,UA: NEGATIVE
RBC, UA: NEGATIVE
Specific Gravity, UA: 1.022 (ref 1.005–1.030)
Urobilinogen, Ur: 0.2 mg/dL (ref 0.2–1.0)
pH, UA: 5.5 (ref 5.0–7.5)

## 2019-09-26 LAB — MICROSCOPIC EXAMINATION: Casts: NONE SEEN /lpf

## 2019-09-28 ENCOUNTER — Telehealth: Payer: Self-pay

## 2019-09-28 LAB — IGP, APTIMA HPV: HPV Aptima: NEGATIVE

## 2019-09-28 NOTE — Progress Notes (Signed)
Can you please let the patient know that her pap smear was normal. Thanks.

## 2019-09-28 NOTE — Telephone Encounter (Signed)
-----   Message from Ronnell Freshwater, NP sent at 09/28/2019  8:30 AM EST ----- Can you please let the patient know that her pap smear was normal. Thanks.

## 2019-09-28 NOTE — Telephone Encounter (Signed)
Pt was notified.  

## 2019-10-10 DIAGNOSIS — D2262 Melanocytic nevi of left upper limb, including shoulder: Secondary | ICD-10-CM | POA: Diagnosis not present

## 2019-10-10 DIAGNOSIS — L821 Other seborrheic keratosis: Secondary | ICD-10-CM | POA: Diagnosis not present

## 2019-10-10 DIAGNOSIS — D2261 Melanocytic nevi of right upper limb, including shoulder: Secondary | ICD-10-CM | POA: Diagnosis not present

## 2019-10-10 DIAGNOSIS — Z85828 Personal history of other malignant neoplasm of skin: Secondary | ICD-10-CM | POA: Diagnosis not present

## 2019-10-10 DIAGNOSIS — X32XXXA Exposure to sunlight, initial encounter: Secondary | ICD-10-CM | POA: Diagnosis not present

## 2019-10-10 DIAGNOSIS — L570 Actinic keratosis: Secondary | ICD-10-CM | POA: Diagnosis not present

## 2019-10-10 DIAGNOSIS — D2272 Melanocytic nevi of left lower limb, including hip: Secondary | ICD-10-CM | POA: Diagnosis not present

## 2019-10-24 ENCOUNTER — Ambulatory Visit
Admission: RE | Admit: 2019-10-24 | Discharge: 2019-10-24 | Disposition: A | Discharge status: Home / Self Care | Payer: Medicare HMO | Source: Ambulatory Visit | Attending: Nurse Practitioner | Admitting: Nurse Practitioner

## 2019-10-24 DIAGNOSIS — Z13820 Encounter for screening for osteoporosis: Secondary | ICD-10-CM

## 2019-10-24 DIAGNOSIS — Z780 Asymptomatic menopausal state: Secondary | ICD-10-CM | POA: Insufficient documentation

## 2019-10-24 DIAGNOSIS — M8589 Other specified disorders of bone density and structure, multiple sites: Secondary | ICD-10-CM | POA: Diagnosis not present

## 2019-10-24 DIAGNOSIS — Z1231 Encounter for screening mammogram for malignant neoplasm of breast: Secondary | ICD-10-CM | POA: Insufficient documentation

## 2019-10-26 NOTE — Progress Notes (Signed)
Negative mammogram

## 2019-10-26 NOTE — Progress Notes (Signed)
Osteopenia. Discuss at next visit.

## 2019-10-27 ENCOUNTER — Telehealth: Payer: Self-pay

## 2019-10-27 NOTE — Telephone Encounter (Signed)
COMPLETED RECORD REQUEST AND FAXED TO [employer].

## 2019-12-05 ENCOUNTER — Telehealth: Payer: Self-pay

## 2019-12-05 NOTE — Telephone Encounter (Signed)
Confirmed appointment on 12/07/2019 and screened for covid. [facility]  °

## 2019-12-06 ENCOUNTER — Ambulatory Visit (INDEPENDENT_AMBULATORY_CARE_PROVIDER_SITE_OTHER): Payer: Medicare HMO | Admitting: Adult Health

## 2019-12-06 ENCOUNTER — Ambulatory Visit
Admission: RE | Admit: 2019-12-06 | Discharge: 2019-12-06 | Disposition: A | Discharge status: Home / Self Care | Payer: Medicare HMO | Source: Ambulatory Visit | Attending: Adult Health | Admitting: Adult Health

## 2019-12-06 ENCOUNTER — Other Ambulatory Visit: Payer: Self-pay

## 2019-12-06 ENCOUNTER — Encounter: Payer: Self-pay | Admitting: Adult Health

## 2019-12-06 ENCOUNTER — Ambulatory Visit
Admission: RE | Admit: 2019-12-06 | Discharge: 2019-12-06 | Disposition: A | Discharge status: Home / Self Care | Payer: Medicare HMO | Attending: Adult Health | Admitting: Adult Health

## 2019-12-06 VITALS — BP 175/93 | HR 75 | Temp 96.9°F | Resp 16 | Ht 63.0 in | Wt 189.8 lb

## 2019-12-06 DIAGNOSIS — L659 Nonscarring hair loss, unspecified: Secondary | ICD-10-CM

## 2019-12-06 DIAGNOSIS — M25551 Pain in right hip: Secondary | ICD-10-CM | POA: Insufficient documentation

## 2019-12-06 DIAGNOSIS — M545 Low back pain: Secondary | ICD-10-CM | POA: Diagnosis not present

## 2019-12-06 DIAGNOSIS — M5440 Lumbago with sciatica, unspecified side: Secondary | ICD-10-CM

## 2019-12-06 DIAGNOSIS — G8929 Other chronic pain: Secondary | ICD-10-CM

## 2019-12-06 DIAGNOSIS — E538 Deficiency of other specified B group vitamins: Secondary | ICD-10-CM | POA: Diagnosis not present

## 2019-12-06 DIAGNOSIS — I10 Essential (primary) hypertension: Secondary | ICD-10-CM

## 2019-12-06 MED ORDER — TRAMADOL HCL 50 MG PO TABS: 50.0000 mg | ORAL_TABLET | Freq: Four times a day (QID) | ORAL | 0 refills | Status: DC | PRN

## 2019-12-06 MED ORDER — PREDNISONE 10 MG PO TABS: ORAL_TABLET | ORAL | 0 refills | Status: DC

## 2019-12-06 NOTE — Progress Notes (Signed)
[facility] [place], [place] [address]  Internal MEDICINE  Office Visit Note  Patient Name: Paula [name]  [id number]  [id number]  Date of Service: 12/06/2019  Chief Complaint  Patient presents with  . Hip Pain  . Back Pain     HPI Pt is here for a sick visit. Pt reports right hip pain that has been going on for a year.  She describes the pain as achy most of the time.  This pain has now become severe.  She reports she was walking up some steps recently and her leg "gave out" on her. About two days ago she began having low back pain in conjunction with this hip pain.  She also has some burning pain, that radiates down her right leg. She has been taking her blood pressure daily, this morning at home it was 129/84.  She reports her bp is always elevated in office.      Current Medication:  Outpatient Encounter Medications as of 12/06/2019  Medication Sig  . Acetaminophen 500 MG coapsule Take by mouth.  Marland Kitchen acyclovir (ZOVIRAX) 400 MG tablet Take 1 tablet (400 mg total) by mouth 4 (four) times daily.  Marland Kitchen acyclovir ointment (ZOVIRAX) 5 % Apply 1 application topically every 3 (three) hours.  Marland Kitchen amLODipine (NORVASC) 2.5 MG tablet Take 1 tablet (2.5 mg total) by mouth daily.  Marland Kitchen aspirin 81 MG chewable tablet Chew 81 mg by mouth daily.  Marland Kitchen aspirin-acetaminophen-caffeine (EXCEDRIN MIGRAINE) 250-250-65 MG tablet Take 1 tablet by mouth every 6 (six) hours as needed for headache.  . chlorpheniramine-HYDROcodone (TUSSIONEX PENNKINETIC ER) 10-8 MG/5ML SUER Take 5 mLs by mouth every 12 (twelve) hours as needed for cough.  . desloratadine (CLARINEX) 5 MG tablet Take 1 tablet (5 mg total) by mouth daily.  . fluticasone (FLONASE) 50 MCG/ACT nasal spray Place 1 spray into both nostrils 2 (two) times daily.  Marland Kitchen LORazepam (ATIVAN) 0.5 MG tablet Take 1 tablet (0.5 mg total) by mouth 2 (two) times daily as needed for anxiety.  . pantoprazole (PROTONIX) 40 MG tablet Take 1 tablet (40  mg total) by mouth daily.  . traZODone (DESYREL) 50 MG tablet Take 1 tablet (50 mg total) by mouth at bedtime as needed for sleep.  . predniSONE (DELTASONE) 10 MG tablet Use per dose pack  . traMADol (ULTRAM) 50 MG tablet Take 1-2 tablets (50-100 mg total) by mouth every 6 (six) hours as needed.   No facility-administered encounter medications on file as of 12/06/2019.      Medical History: Past Medical History:  Diagnosis Date  . Asthma   . Environmental allergies   . Migraine   . Sinusitis      Vital Signs: BP (!) 175/93   Pulse 75   Temp (!) 96.9 F (36.1 C)   Resp 16   Ht 5\' 3"  (1.6 m)   Wt 189 lb 12.8 oz (86.1 kg)   SpO2 97%   BMI 33.62 kg/m    Review of Systems  Constitutional: Negative for chills, fatigue and unexpected weight change.  HENT: Negative for congestion, rhinorrhea, sneezing and sore throat.   Eyes: Negative for photophobia, pain and redness.  Respiratory: Negative for cough, chest tightness and shortness of breath.   Cardiovascular: Negative for chest pain and palpitations.  Gastrointestinal: Negative for abdominal pain, constipation, diarrhea, nausea and vomiting.  Endocrine: Negative.   Genitourinary: Negative for dysuria and frequency.  Musculoskeletal: Negative for arthralgias, back pain, joint swelling and neck pain.  Skin: Negative for rash.  Allergic/Immunologic: Negative.   Neurological: Negative for tremors and numbness.  Hematological: Negative for adenopathy. Does not bruise/bleed easily.  Psychiatric/Behavioral: Negative for behavioral problems and sleep disturbance. The patient is not nervous/anxious.     Physical Exam Vitals and nursing note reviewed.  Constitutional:      General: She is not in acute distress.    Appearance: She is well-developed. She is not diaphoretic.  HENT:     Head: Normocephalic and atraumatic.     Mouth/Throat:     Pharynx: No oropharyngeal exudate.  Eyes:     Pupils: Pupils are equal, round, and  reactive to light.  Neck:     Thyroid: No thyromegaly.     Vascular: No JVD.     Trachea: No tracheal deviation.  Cardiovascular:     Rate and Rhythm: Normal rate and regular rhythm.     Heart sounds: Normal heart sounds. No murmur. No friction rub. No gallop.   Pulmonary:     Effort: Pulmonary effort is normal. No respiratory distress.     Breath sounds: Normal breath sounds. No wheezing or rales.  Chest:     Chest wall: No tenderness.  Abdominal:     Palpations: Abdomen is soft.     Tenderness: There is no abdominal tenderness. There is no guarding.  Musculoskeletal:        General: Normal range of motion.     Cervical back: Normal range of motion and neck supple.  Lymphadenopathy:     Cervical: No cervical adenopathy.  Skin:    General: Skin is warm and dry.  Neurological:     Mental Status: She is alert and oriented to person, place, and time.     Cranial Nerves: No cranial nerve deficit.  Psychiatric:        Behavior: Behavior normal.        Thought Content: Thought content normal.        Judgment: Judgment normal.    Assessment/Plan: 1. Chronic pain of right hip - DG Hip Unilat W OR W/O Pelvis 2-3 Views Right; Future  2. Acute right-sided low back pain with sciatica, sciatica laterality unspecified Use tramadol as needed.  Reviewed risks and possible side effects associated with taking opiates, benzodiazepines and other CNS depressants. Combination of these could cause dizziness and drowsiness. Advised patient not to drive or operate machinery when taking these medications, as patient's and other's life can be at risk and will have consequences. Patient verbalized understanding in this matter. Dependence and abuse for these drugs will be monitored closely. A Controlled substance policy and procedure is on file which allows [facility] to order a urine drug screen test at any visit. Patient understands and agrees with the plan - DG Lumbar Spine Complete;  Future - traMADol (ULTRAM) 50 MG tablet; Take 1-2 tablets (50-100 mg total) by mouth every 6 (six) hours as needed.  Dispense: 30 tablet; Refill: 0  3. Essential hypertension Diastolic elevated, continue to monitor at follow up  4. Hair loss - TSH + free T4 - Fe+TIBC+Fer - spironolactone (ALDACTONE) 25 MG tablet; Take 1 tablet (25 mg total) by mouth 2 (two) times daily.  Dispense: 60 tablet; Refill: 1  5. B12 deficiency - B12 and Folate Panel  General Counseling: Baeley verbalizes understanding of the findings of todays visit and agrees with plan of treatment. I have discussed any further diagnostic evaluation that may be needed or ordered today. We also reviewed her medications today.  she has been encouraged to call the office with any questions or concerns that should arise related to todays visit.   Orders Placed This Encounter  Procedures  . DG Lumbar Spine Complete  . DG Hip Unilat W OR W/O Pelvis 2-3 Views Right  . TSH + free T4    Meds ordered this encounter  Medications  . predniSONE (DELTASONE) 10 MG tablet    Sig: Use per dose pack    Dispense:  21 tablet    Refill:  0  . traMADol (ULTRAM) 50 MG tablet    Sig: Take 1-2 tablets (50-100 mg total) by mouth every 6 (six) hours as needed.    Dispense:  30 tablet    Refill:  0    Time spent: 25 Minutes  This patient was seen by Orson Gear AGNP-C in Collaboration with Dr Lavera Guise as a part of collaborative care agreement.  Kendell Bane AGNP-C Internal Medicine

## 2019-12-07 ENCOUNTER — Telehealth: Payer: Self-pay

## 2019-12-07 ENCOUNTER — Ambulatory Visit: Payer: Medicare HMO | Admitting: Adult Health

## 2019-12-07 LAB — TSH+FREE T4
Free T4: 1.15 ng/dL (ref 0.82–1.77)
TSH: 2.76 u[IU]/mL (ref 0.450–4.500)

## 2019-12-07 NOTE — Telephone Encounter (Signed)
Pt called stating that she was given prednisone for inflammation in her hip yesterday and her bp has been elevated. Pt states she had stopped bp meds because her bp readings have been normal. Last night bp reading was 173/91 with hr 95 and bp reading today was 142/78 and hr 85 as the lowest reading today. Pt wanted to know if it is okay to start back on her amlodipine 2.5 mg daily due to elevated bp.  Spoke with adam and notified pt that it is ok to start back bp med.

## 2019-12-12 ENCOUNTER — Telehealth: Payer: Self-pay

## 2019-12-12 NOTE — Telephone Encounter (Signed)
Confirmed appointment on 12/14/2019 and screened for covid. [facility] 

## 2019-12-14 ENCOUNTER — Ambulatory Visit (INDEPENDENT_AMBULATORY_CARE_PROVIDER_SITE_OTHER): Payer: Medicare HMO | Admitting: Adult Health

## 2019-12-14 ENCOUNTER — Encounter: Payer: Self-pay | Admitting: Adult Health

## 2019-12-14 ENCOUNTER — Other Ambulatory Visit: Payer: Self-pay

## 2019-12-14 VITALS — BP 158/74 | HR 86 | Temp 97.3°F | Resp 16 | Ht 63.0 in | Wt 192.0 lb

## 2019-12-14 DIAGNOSIS — I10 Essential (primary) hypertension: Secondary | ICD-10-CM

## 2019-12-14 DIAGNOSIS — M47816 Spondylosis without myelopathy or radiculopathy, lumbar region: Secondary | ICD-10-CM

## 2019-12-14 DIAGNOSIS — K219 Gastro-esophageal reflux disease without esophagitis: Secondary | ICD-10-CM | POA: Diagnosis not present

## 2019-12-14 DIAGNOSIS — F411 Generalized anxiety disorder: Secondary | ICD-10-CM | POA: Diagnosis not present

## 2019-12-14 MED ORDER — CELECOXIB 100 MG PO CAPS: 100.0000 mg | ORAL_CAPSULE | Freq: Two times a day (BID) | ORAL | 2 refills | Status: DC

## 2019-12-14 MED ORDER — AMLODIPINE BESYLATE 2.5 MG PO TABS: 2.5000 mg | ORAL_TABLET | Freq: Every day | ORAL | 1 refills | Status: DC

## 2019-12-14 NOTE — Progress Notes (Signed)
[facility] [place], [place] [address]  Internal MEDICINE  Office Visit Note  Patient Name: Paula [name]  [id number]  [id number]  Date of Service: 12/14/2019  Chief Complaint  Patient presents with  . Follow-up    xray   . Hypertension    HPI   Pt is here for follow up on HTN.  Pts bp log shows normal bp's at home.  Today in office she is 158/74. Denies Chest pain, Shortness of breath, palpitations, headache, or blurred vision. She also had lumbar and hip x rays done.  Her lumbar x-ray shows facets disease.  She was placed on prednisone and reports excellent symptom improvement. Discussed NSAIDS daily, for the next few months to help with inflamation.    Current Medication: Outpatient Encounter Medications as of 12/14/2019  Medication Sig  . Acetaminophen 500 MG coapsule Take by mouth.  Marland Kitchen acyclovir (ZOVIRAX) 400 MG tablet Take 1 tablet (400 mg total) by mouth 4 (four) times daily.  Marland Kitchen acyclovir ointment (ZOVIRAX) 5 % Apply 1 application topically every 3 (three) hours.  Marland Kitchen amLODipine (NORVASC) 2.5 MG tablet Take 1 tablet (2.5 mg total) by mouth daily.  Marland Kitchen aspirin 81 MG chewable tablet Chew 81 mg by mouth daily.  Marland Kitchen aspirin-acetaminophen-caffeine (EXCEDRIN MIGRAINE) 250-250-65 MG tablet Take 1 tablet by mouth every 6 (six) hours as needed for headache.  Marland Kitchen LORazepam (ATIVAN) 0.5 MG tablet Take 1 tablet (0.5 mg total) by mouth 2 (two) times daily as needed for anxiety.  . pantoprazole (PROTONIX) 40 MG tablet Take 1 tablet (40 mg total) by mouth daily.  . traMADol (ULTRAM) 50 MG tablet Take 1-2 tablets (50-100 mg total) by mouth every 6 (six) hours as needed.  . traZODone (DESYREL) 50 MG tablet Take 1 tablet (50 mg total) by mouth at bedtime as needed for sleep.  . [DISCONTINUED] amLODipine (NORVASC) 2.5 MG tablet Take 1 tablet (2.5 mg total) by mouth daily.  . [DISCONTINUED] desloratadine (CLARINEX) 5 MG tablet Take 1 tablet (5 mg total) by mouth daily.  .  [DISCONTINUED] fluticasone (FLONASE) 50 MCG/ACT nasal spray Place 1 spray into both nostrils 2 (two) times daily.  . [DISCONTINUED] chlorpheniramine-HYDROcodone (TUSSIONEX PENNKINETIC ER) 10-8 MG/5ML SUER Take 5 mLs by mouth every 12 (twelve) hours as needed for cough. (Patient not taking: Reported on 12/14/2019)  . [DISCONTINUED] predniSONE (DELTASONE) 10 MG tablet Use per dose pack (Patient not taking: Reported on 12/14/2019)   No facility-administered encounter medications on file as of 12/14/2019.    Surgical History: Past Surgical History:  Procedure Laterality Date  . AUGMENTATION MAMMAPLASTY Bilateral 1987  . [place]  . skin cancer   08/11/2016   removal  . TUBAL LIGATION  08/11/2016    Medical History: Past Medical History:  Diagnosis Date  . Asthma   . Environmental allergies   . GERD (gastroesophageal reflux disease)   . Hypertension   . Migraine   . Sinusitis     Family History: Family History  Problem Relation Age of Onset  . Breast cancer Cousin   . COPD Mother   . Ulcers Father     Social History   Socioeconomic History  . Marital status: Married    Spouse name: Not on file  . Number of children: Not on file  . Years of education: Not on file  . Highest education level: Not on file  Occupational History  . Not on file  Tobacco Use  . Smoking status: Former Smoker  Quit date: 12/01/2004    Years since quitting: 15.0  . Smokeless tobacco: Never Used  Substance and Sexual Activity  . Alcohol use: Yes    Comment: ocassionally  . Drug use: No  . Sexual activity: Not on file  Other Topics Concern  . Not on file  Social History Narrative  . Not on file   Social Determinants of Health   Financial Resource Strain:   . Difficulty of Paying Living Expenses:   Food Insecurity:   . Worried About Charity fundraiser in the Last Year:   . Arboriculturist in the Last Year:   Transportation Needs:   . Film/video editor (Medical):   Marland Kitchen Lack  of Transportation (Non-Medical):   Physical Activity:   . Days of Exercise per Week:   . Minutes of Exercise per Session:   Stress:   . Feeling of Stress :   Social Connections:   . Frequency of Communication with Friends and Family:   . Frequency of Social Gatherings with Friends and Family:   . Attends Religious Services:   . Active Member of Clubs or Organizations:   . Attends Archivist Meetings:   Marland Kitchen Marital Status:   Intimate Partner Violence:   . Fear of Current or Ex-Partner:   . Emotionally Abused:   Marland Kitchen Physically Abused:   . Sexually Abused:       Review of Systems  Constitutional: Negative for chills, fatigue and unexpected weight change.  HENT: Negative for congestion, rhinorrhea, sneezing and sore throat.   Eyes: Negative for photophobia, pain and redness.  Respiratory: Negative for cough, chest tightness and shortness of breath.   Cardiovascular: Negative for chest pain and palpitations.  Gastrointestinal: Negative for abdominal pain, constipation, diarrhea, nausea and vomiting.  Endocrine: Negative.   Genitourinary: Negative for dysuria and frequency.  Musculoskeletal: Negative for arthralgias, back pain, joint swelling and neck pain.  Skin: Negative for rash.  Allergic/Immunologic: Negative.   Neurological: Negative for tremors and numbness.  Hematological: Negative for adenopathy. Does not bruise/bleed easily.  Psychiatric/Behavioral: Negative for behavioral problems and sleep disturbance. The patient is not nervous/anxious.     Vital Signs: BP (!) 158/74   Pulse 86   Temp (!) 97.3 F (36.3 C)   Resp 16   Ht 5\' 3"  (1.6 m)   Wt 192 lb (87.1 kg)   SpO2 97%   BMI 34.01 kg/m    Physical Exam Vitals and nursing note reviewed.  Constitutional:      General: She is not in acute distress.    Appearance: She is well-developed. She is not diaphoretic.  HENT:     Head: Normocephalic and atraumatic.     Mouth/Throat:     Pharynx: No  oropharyngeal exudate.  Eyes:     Pupils: Pupils are equal, round, and reactive to light.  Neck:     Thyroid: No thyromegaly.     Vascular: No JVD.     Trachea: No tracheal deviation.  Cardiovascular:     Rate and Rhythm: Normal rate and regular rhythm.     Heart sounds: Normal heart sounds. No murmur. No friction rub. No gallop.   Pulmonary:     Effort: Pulmonary effort is normal. No respiratory distress.     Breath sounds: Normal breath sounds. No wheezing or rales.  Chest:     Chest wall: No tenderness.  Abdominal:     Palpations: Abdomen is soft.     Tenderness: There is  no abdominal tenderness. There is no guarding.  Musculoskeletal:        General: Normal range of motion.     Cervical back: Normal range of motion and neck supple.  Lymphadenopathy:     Cervical: No cervical adenopathy.  Skin:    General: Skin is warm and dry.  Neurological:     Mental Status: She is alert and oriented to person, place, and time.     Cranial Nerves: No cranial nerve deficit.  Psychiatric:        Behavior: Behavior normal.        Thought Content: Thought content normal.        Judgment: Judgment normal.    Assessment/Plan: 1. Essential hypertension Continue current does of Norvasc.  Refilled at this time. - amLODipine (NORVASC) 2.5 MG tablet; Take 1 tablet (2.5 mg total) by mouth daily.  Dispense: 90 tablet; Refill: 1  2. Facet syndrome, lumbar Start Celebrex as discussed.  - celecoxib (CELEBREX) 100 MG capsule; Take 1 capsule (100 mg total) by mouth 2 (two) times daily.  Dispense: 60 capsule; Refill: 2  3. Gastro-esophageal reflux disease without esophagitis Good control, continue current management.  4. Generalized anxiety disorder No current issues.  Continue present management.  General Counseling: jekia amer understanding of the findings of todays visit and agrees with plan of treatment. I have discussed any further diagnostic evaluation that may be needed or ordered  today. We also reviewed her medications today. she has been encouraged to call the office with any questions or concerns that should arise related to todays visit.    No orders of the defined types were placed in this encounter.   Meds ordered this encounter  Medications  . amLODipine (NORVASC) 2.5 MG tablet    Sig: Take 1 tablet (2.5 mg total) by mouth daily.    Dispense:  90 tablet    Refill:  1    Time spent: 25 Minutes   This patient was seen by Orson Gear AGNP-C in Collaboration with Dr Lavera Guise as a part of collaborative care agreement     Kendell Bane AGNP-C Internal medicine

## 2020-01-10 ENCOUNTER — Telehealth: Payer: Self-pay

## 2020-01-10 MED ORDER — SPIRONOLACTONE 25 MG PO TABS: 25.0000 mg | ORAL_TABLET | Freq: Two times a day (BID) | ORAL | 1 refills | Status: DC

## 2020-01-10 NOTE — Telephone Encounter (Signed)
-----   Message from Kendell Bane, NP sent at 01/10/2020  3:40 PM EDT ----- Please call and her tell her to get a few more labs drawn before her appt with heather next month.  Also I talked to Dr. Humphrey Rolls and she said she could try the spirinolactone for the hair loss. So I sent the RX.

## 2020-01-10 NOTE — Telephone Encounter (Signed)
Pt was notified to get more labs done for iron and b12 and also notified pt that spirnolactone was given for hair loss.

## 2020-01-23 ENCOUNTER — Ambulatory Visit: Payer: Medicare HMO | Admitting: Nurse Practitioner

## 2020-01-25 ENCOUNTER — Telehealth: Payer: Self-pay

## 2020-01-25 NOTE — Telephone Encounter (Signed)
Lmom to confirm for 01-30-20 ov.

## 2020-01-30 ENCOUNTER — Ambulatory Visit: Payer: Medicare HMO | Admitting: Nurse Practitioner

## 2020-02-01 ENCOUNTER — Telehealth: Payer: Self-pay

## 2020-02-01 ENCOUNTER — Other Ambulatory Visit: Payer: Self-pay | Admitting: Adult Health

## 2020-02-01 DIAGNOSIS — F411 Generalized anxiety disorder: Secondary | ICD-10-CM

## 2020-02-01 MED ORDER — LORAZEPAM 0.5 MG PO TABS: 0.5000 mg | ORAL_TABLET | Freq: Two times a day (BID) | ORAL | 2 refills | Status: DC | PRN

## 2020-02-01 NOTE — Progress Notes (Signed)
Sent RX for Lorazepam.

## 2020-02-02 NOTE — Telephone Encounter (Signed)
Done

## 2020-02-07 ENCOUNTER — Telehealth: Payer: Self-pay

## 2020-02-07 NOTE — Telephone Encounter (Signed)
Confirmed and screened for 02-09-20 ov.

## 2020-02-09 ENCOUNTER — Ambulatory Visit (INDEPENDENT_AMBULATORY_CARE_PROVIDER_SITE_OTHER): Payer: Medicare HMO | Admitting: Nurse Practitioner

## 2020-02-09 ENCOUNTER — Other Ambulatory Visit: Payer: Self-pay

## 2020-02-09 ENCOUNTER — Encounter: Payer: Self-pay | Admitting: Nurse Practitioner

## 2020-02-09 VITALS — BP 148/68 | HR 77 | Temp 97.4°F | Resp 16 | Ht 63.0 in | Wt 193.8 lb

## 2020-02-09 DIAGNOSIS — F411 Generalized anxiety disorder: Secondary | ICD-10-CM

## 2020-02-09 DIAGNOSIS — I10 Essential (primary) hypertension: Secondary | ICD-10-CM

## 2020-02-09 DIAGNOSIS — K219 Gastro-esophageal reflux disease without esophagitis: Secondary | ICD-10-CM | POA: Diagnosis not present

## 2020-02-09 NOTE — Progress Notes (Signed)
[facility] [place],  [address]  Internal MEDICINE  Office Visit Note  Patient Name: Paula [name]  [id number]  [id number]  Date of Service: 02/12/2020  Chief Complaint  Patient presents with  . Hypertension  . Gastroesophageal Reflux  . Asthma  . Allergies    The patient is here for routine follow up. She states that she is doing well. Blood pressure slightly elevated at start of visit. States that she just dropped her grandson off at school for his EOGs. Her grandson has been doing Holiday representative all year and has never been to the school. She states that she was a little stressed about this. She does monitor her blood pressure routinely at home. She states that it is generally 120-130 over 70-80. She has no concerns or complaints today. Denies headache, chest pain, chest pressure, or shortness of breath. She states that labs were ordered after her most recent visit and she has forgotten to get them done. Plans to go after the completion of this visit.       Current Medication: Outpatient Encounter Medications as of 02/09/2020  Medication Sig  . Acetaminophen 500 MG coapsule Take by mouth.  Marland Kitchen acyclovir (ZOVIRAX) 400 MG tablet Take 1 tablet (400 mg total) by mouth 4 (four) times daily.  Marland Kitchen acyclovir ointment (ZOVIRAX) 5 % Apply 1 application topically every 3 (three) hours.  Marland Kitchen amLODipine (NORVASC) 2.5 MG tablet Take 1 tablet (2.5 mg total) by mouth daily.  Marland Kitchen aspirin 81 MG chewable tablet Chew 81 mg by mouth daily.  Marland Kitchen aspirin-acetaminophen-caffeine (EXCEDRIN MIGRAINE) 250-250-65 MG tablet Take 1 tablet by mouth every 6 (six) hours as needed for headache.  . celecoxib (CELEBREX) 100 MG capsule Take 1 capsule (100 mg total) by mouth 2 (two) times daily.  Marland Kitchen LORazepam (ATIVAN) 0.5 MG tablet Take 1 tablet (0.5 mg total) by mouth 2 (two) times daily as needed for anxiety.  . pantoprazole (PROTONIX) 40 MG tablet Take 1 tablet (40 mg total) by mouth daily.   Marland Kitchen spironolactone (ALDACTONE) 25 MG tablet Take 1 tablet (25 mg total) by mouth 2 (two) times daily.  . traMADol (ULTRAM) 50 MG tablet Take 1-2 tablets (50-100 mg total) by mouth every 6 (six) hours as needed.  . traZODone (DESYREL) 50 MG tablet Take 1 tablet (50 mg total) by mouth at bedtime as needed for sleep.   No facility-administered encounter medications on file as of 02/09/2020.    Surgical History: Past Surgical History:  Procedure Laterality Date  . AUGMENTATION MAMMAPLASTY Bilateral 1987  . [place]  . skin cancer   08/11/2016   removal  . TUBAL LIGATION  08/11/2016    Medical History: Past Medical History:  Diagnosis Date  . Asthma   . Environmental allergies   . GERD (gastroesophageal reflux disease)   . Hypertension   . Migraine   . Sinusitis     Family History: Family History  Problem Relation Age of Onset  . Breast cancer Cousin   . COPD Mother   . Ulcers Father     Social History   Socioeconomic History  . Marital status: Married    Spouse name: Not on file  . Number of children: Not on file  . Years of education: Not on file  . Highest education level: Not on file  Occupational History  . Not on file  Tobacco Use  . Smoking status: Former Smoker    Quit date: 12/01/2004  Years since quitting: 15.2  . Smokeless tobacco: Never Used  Substance and Sexual Activity  . Alcohol use: Yes    Comment: ocassionally  . Drug use: No  . Sexual activity: Not on file  Other Topics Concern  . Not on file  Social History Narrative  . Not on file   Social Determinants of Health   Financial Resource Strain:   . Difficulty of Paying Living Expenses:   Food Insecurity:   . Worried About Charity fundraiser in the Last Year:   . Arboriculturist in the Last Year:   Transportation Needs:   . Film/video editor (Medical):   Marland Kitchen Lack of Transportation (Non-Medical):   Physical Activity:   . Days of Exercise per Week:   . Minutes of  Exercise per Session:   Stress:   . Feeling of Stress :   Social Connections:   . Frequency of Communication with Friends and Family:   . Frequency of Social Gatherings with Friends and Family:   . Attends Religious Services:   . Active Member of Clubs or Organizations:   . Attends Archivist Meetings:   Marland Kitchen Marital Status:   Intimate Partner Violence:   . Fear of Current or Ex-Partner:   . Emotionally Abused:   Marland Kitchen Physically Abused:   . Sexually Abused:       Review of Systems  Constitutional: Negative for chills, fatigue and unexpected weight change.  HENT: Negative for congestion, postnasal drip, rhinorrhea, sneezing and sore throat. Nosebleeds:     Respiratory: Negative for cough, chest tightness, shortness of breath and wheezing.   Cardiovascular: Negative for chest pain and palpitations.  Gastrointestinal: Negative for abdominal pain, constipation, diarrhea, nausea and vomiting.  Endocrine: Negative for cold intolerance, heat intolerance, polydipsia and polyuria.  Musculoskeletal: Negative for arthralgias, back pain, joint swelling and neck pain.  Skin: Negative for rash.  Allergic/Immunologic: Positive for environmental allergies.  Neurological: Negative for dizziness, tremors, numbness and headaches.  Hematological: Negative for adenopathy. Does not bruise/bleed easily.  Psychiatric/Behavioral: Negative for behavioral problems (Depression), dysphoric mood, sleep disturbance and suicidal ideas. The patient is not nervous/anxious.     Today's Vitals   02/09/20 0910  BP: (!) 148/68  Pulse: 77  Resp: 16  Temp: (!) 97.4 F (36.3 C)  SpO2: 98%  Weight: 193 lb 12.8 oz (87.9 kg)  Height: 5\' 3"  (1.6 m)   Body mass index is 34.33 kg/m.  Physical Exam Vitals and nursing note reviewed.  Constitutional:      General: She is not in acute distress.    Appearance: Normal appearance. She is well-developed. She is not diaphoretic.  HENT:     Head: Normocephalic and  atraumatic.     Nose: Nose normal.     Mouth/Throat:     Pharynx: No oropharyngeal exudate.  Eyes:     Pupils: Pupils are equal, round, and reactive to light.  Neck:     Thyroid: No thyromegaly.     Vascular: No JVD.     Trachea: No tracheal deviation.  Cardiovascular:     Rate and Rhythm: Normal rate and regular rhythm.     Heart sounds: Normal heart sounds. No murmur. No friction rub. No gallop.   Pulmonary:     Effort: Pulmonary effort is normal. No respiratory distress.     Breath sounds: Normal breath sounds. No wheezing or rales.  Chest:     Chest wall: No tenderness.  Abdominal:  General: Bowel sounds are normal.     Palpations: Abdomen is soft.     Tenderness: There is no abdominal tenderness.  Musculoskeletal:        General: Normal range of motion.     Cervical back: Normal range of motion and neck supple.  Lymphadenopathy:     Cervical: No cervical adenopathy.  Skin:    General: Skin is warm and dry.  Neurological:     General: No focal deficit present.     Mental Status: She is alert and oriented to person, place, and time.     Cranial Nerves: No cranial nerve deficit.  Psychiatric:        Mood and Affect: Mood normal.        Behavior: Behavior normal.        Thought Content: Thought content normal.        Judgment: Judgment normal.   Assessment/Plan: 1. Essential hypertension Improved and stable. Continue bp medication as prescribed.   2. Gastro-esophageal reflux disease without esophagitis Well managed. Continue pantoprazole as needed and as prescribed.   3. Generalized anxiety disorder Improved. Continue trazodone as needed and as prescribed.   General Counseling: bryn wolfe understanding of the findings of todays visit and agrees with plan of treatment. I have discussed any further diagnostic evaluation that may be needed or ordered today. We also reviewed her medications today. she has been encouraged to call the office with any questions or  concerns that should arise related to todays visit.   Hypertension Counseling:   The following hypertensive lifestyle modification were recommended and discussed:  1. Limiting alcohol intake to less than 1 oz/day of ethanol:(24 oz of beer or 8 oz of wine or 2 oz of 100-proof whiskey). 2. Take baby ASA 81 mg daily. 3. Importance of regular aerobic exercise and losing weight. 4. Reduce dietary saturated fat and cholesterol intake for overall cardiovascular health. 5. Maintaining adequate dietary potassium, calcium, and magnesium intake. 6. Regular monitoring of the blood pressure. 7. Reduce sodium intake to less than 100 mmol/day (less than 2.3 gm of sodium or less than 6 gm of sodium choride)   This patient was seen by [place] with Dr Lavera Guise as a part of collaborative care agreement   Total time spent: 25 Minutes   Time spent includes review of chart, medications, test results, and follow up plan with the patient.      Dr Lavera Guise Internal medicine

## 2020-03-21 DIAGNOSIS — L659 Nonscarring hair loss, unspecified: Secondary | ICD-10-CM | POA: Diagnosis not present

## 2020-03-21 DIAGNOSIS — E538 Deficiency of other specified B group vitamins: Secondary | ICD-10-CM | POA: Diagnosis not present

## 2020-03-22 LAB — IRON,TIBC AND FERRITIN PANEL
Ferritin: 124 ng/mL (ref 15–150)
Iron Saturation: 39 % (ref 15–55)
Iron: 99 ug/dL (ref 27–139)
Total Iron Binding Capacity: 251 ug/dL (ref 250–450)
UIBC: 152 ug/dL (ref 118–369)

## 2020-03-22 LAB — B12 AND FOLATE PANEL
Folate: 14.6 ng/mL (ref >3.0)
Vitamin B-12: 667 pg/mL (ref 232–1245)

## 2020-04-03 ENCOUNTER — Other Ambulatory Visit: Payer: Self-pay | Admitting: Adult Health

## 2020-04-03 DIAGNOSIS — M47816 Spondylosis without myelopathy or radiculopathy, lumbar region: Secondary | ICD-10-CM

## 2020-05-08 ENCOUNTER — Other Ambulatory Visit: Payer: Self-pay | Admitting: Adult Health

## 2020-05-08 DIAGNOSIS — L659 Nonscarring hair loss, unspecified: Secondary | ICD-10-CM

## 2020-05-14 ENCOUNTER — Encounter: Payer: Self-pay | Admitting: Nurse Practitioner

## 2020-05-14 ENCOUNTER — Ambulatory Visit (INDEPENDENT_AMBULATORY_CARE_PROVIDER_SITE_OTHER): Payer: Medicare HMO | Admitting: Nurse Practitioner

## 2020-05-14 ENCOUNTER — Other Ambulatory Visit: Payer: Self-pay

## 2020-05-14 VITALS — BP 163/81 | HR 70 | Temp 97.6°F | Resp 16 | Ht 63.0 in | Wt 185.2 lb

## 2020-05-14 DIAGNOSIS — L659 Nonscarring hair loss, unspecified: Secondary | ICD-10-CM

## 2020-05-14 DIAGNOSIS — M47816 Spondylosis without myelopathy or radiculopathy, lumbar region: Secondary | ICD-10-CM

## 2020-05-14 DIAGNOSIS — I10 Essential (primary) hypertension: Secondary | ICD-10-CM

## 2020-05-14 DIAGNOSIS — L237 Allergic contact dermatitis due to plants, except food: Secondary | ICD-10-CM | POA: Diagnosis not present

## 2020-05-14 MED ORDER — METHYLPREDNISOLONE 4 MG PO TBPK: ORAL_TABLET | ORAL | 0 refills | Status: DC

## 2020-05-14 MED ORDER — SPIRONOLACTONE 25 MG PO TABS: 25.0000 mg | ORAL_TABLET | Freq: Two times a day (BID) | ORAL | 3 refills | Status: DC

## 2020-05-14 MED ORDER — CELECOXIB 100 MG PO CAPS: 100.0000 mg | ORAL_CAPSULE | Freq: Two times a day (BID) | ORAL | 3 refills | Status: DC

## 2020-05-14 MED ORDER — TRIAMCINOLONE ACETONIDE 0.1 % EX CREA: 1.0000 "application " | TOPICAL_CREAM | Freq: Two times a day (BID) | CUTANEOUS | 1 refills | Status: DC

## 2020-05-14 MED ORDER — METHYLPREDNISOLONE ACETATE 80 MG/ML IJ SUSP
80.0000 mg | Freq: Once | INTRAMUSCULAR | Status: AC
  Administered 2020-05-14: 40 mg via INTRAMUSCULAR

## 2020-05-14 NOTE — Progress Notes (Signed)
[facility] [place], [place] [address]  Internal MEDICINE  Office Visit Note  Patient Name: Paula [name]  [id number]  [id number]  Date of Service: 05/26/2020   Pt is here for a sick visit.  Chief Complaint  Patient presents with  . Acute Visit  . Poison Ivy  . Gastroesophageal Reflux  . Hypertension     The patient presents for acute vist. She has significant poison ivy or poison oak on her hands, forearms, chest, neck, and progressing toward her face. She is very itchy and irritated. The irritation is so severe that she is unable to sleep at night. She has been using over the counter Zenfel wash. She is applying cortisone cream and calamine lotion to all lesions. They continue to get more irritated and are draining some clear fluid.        Current Medication:  Outpatient Encounter Medications as of 05/14/2020  Medication Sig  . celecoxib (CELEBREX) 100 MG capsule Take 1 capsule (100 mg total) by mouth 2 (two) times daily.  Marland Kitchen LORazepam (ATIVAN) 0.5 MG tablet Take 1 tablet (0.5 mg total) by mouth 2 (two) times daily as needed for anxiety.  Marland Kitchen spironolactone (ALDACTONE) 25 MG tablet Take 1 tablet (25 mg total) by mouth 2 (two) times daily.  . [DISCONTINUED] Acetaminophen 500 MG coapsule Take by mouth.  . [DISCONTINUED] acyclovir (ZOVIRAX) 400 MG tablet Take 1 tablet (400 mg total) by mouth 4 (four) times daily.  . [DISCONTINUED] acyclovir ointment (ZOVIRAX) 5 % Apply 1 application topically every 3 (three) hours.  . [DISCONTINUED] amLODipine (NORVASC) 2.5 MG tablet Take 1 tablet (2.5 mg total) by mouth daily.  . [DISCONTINUED] aspirin 81 MG chewable tablet Chew 81 mg by mouth daily.  . [DISCONTINUED] aspirin-acetaminophen-caffeine (EXCEDRIN MIGRAINE) 250-250-65 MG tablet Take 1 tablet by mouth every 6 (six) hours as needed for headache.  . [DISCONTINUED] celecoxib (CELEBREX) 100 MG capsule TAKE ONE CAPSULE BY MOUTH TWICE A DAY  . [DISCONTINUED]  pantoprazole (PROTONIX) 40 MG tablet Take 1 tablet (40 mg total) by mouth daily.  . [DISCONTINUED] spironolactone (ALDACTONE) 25 MG tablet TAKE 1 TABLET BY MOUTH TWICE A DAY  . [DISCONTINUED] traMADol (ULTRAM) 50 MG tablet Take 1-2 tablets (50-100 mg total) by mouth every 6 (six) hours as needed.  . [DISCONTINUED] traZODone (DESYREL) 50 MG tablet Take 1 tablet (50 mg total) by mouth at bedtime as needed for sleep.  . methylPREDNISolone (MEDROL) 4 MG TBPK tablet Take by mouth as directed for 6 days  . triamcinolone cream (KENALOG) 0.1 % Apply 1 application topically 2 (two) times daily.  . [EXPIRED] methylPREDNISolone acetate (DEPO-MEDROL) injection 80 mg    No facility-administered encounter medications on file as of 05/14/2020.      Medical History: Past Medical History:  Diagnosis Date  . Asthma   . Environmental allergies   . GERD (gastroesophageal reflux disease)   . Hypertension   . Migraine   . Sinusitis      Today's Vitals   05/14/20 1335  BP: (!) 163/81  Pulse: 70  Resp: 16  Temp: 97.6 F (36.4 C)  SpO2: 99%  Weight: 185 lb 3.2 oz (84 kg)  Height: 5\' 3"  (1.6 m)   Body mass index is 32.81 kg/m.  Review of Systems  Constitutional: Negative for activity change, chills, fatigue and unexpected weight change.  HENT: Negative for congestion, postnasal drip, rhinorrhea, sneezing and sore throat.   Respiratory: Negative for cough, chest tightness and shortness of breath.  Cardiovascular: Negative for chest pain and palpitations.  Gastrointestinal: Negative for abdominal pain, constipation, diarrhea, nausea and vomiting.  Musculoskeletal: Positive for back pain. Negative for arthralgias, joint swelling and neck pain.  Skin: Positive for rash.       Poison ivy/poison oak rash to the hands, forearms, chest, and neck. Getting a few lesions on her face as well.   Neurological: Negative.  Negative for tremors and numbness.  Hematological: Negative for adenopathy. Does not  bruise/bleed easily.  Psychiatric/Behavioral: Negative for behavioral problems (Depression), sleep disturbance and suicidal ideas. The patient is not nervous/anxious.     Physical Exam Vitals and nursing note reviewed.  Constitutional:      General: She is not in acute distress.    Appearance: Normal appearance. She is well-developed. She is not diaphoretic.  HENT:     Head: Normocephalic and atraumatic.     Mouth/Throat:     Pharynx: No oropharyngeal exudate.  Eyes:     Pupils: Pupils are equal, round, and reactive to light.  Neck:     Thyroid: No thyromegaly.     Vascular: No JVD.     Trachea: No tracheal deviation.  Cardiovascular:     Rate and Rhythm: Normal rate and regular rhythm.     Heart sounds: Normal heart sounds. No murmur heard.  No friction rub. No gallop.   Pulmonary:     Effort: Pulmonary effort is normal. No respiratory distress.     Breath sounds: Normal breath sounds. No wheezing or rales.  Chest:     Chest wall: No tenderness.  Abdominal:     Palpations: Abdomen is soft.  Musculoskeletal:        General: Normal range of motion.     Cervical back: Normal range of motion and neck supple.  Lymphadenopathy:     Cervical: No cervical adenopathy.  Skin:    General: Skin is warm and dry.     Findings: Erythema and rash present.     Comments: There are poison ivy/poison oak lesions on her hands, forearms, chest, and neck. There are also several lesions on her lower legs. Lesions are in various stages of healing. Many are scabbed. There are many areas where lesions are draining a moderate amount of lear fluid.   Neurological:     Mental Status: She is alert and oriented to person, place, and time.     Cranial Nerves: No cranial nerve deficit.  Psychiatric:        Behavior: Behavior normal.        Thought Content: Thought content normal.        Judgment: Judgment normal.    Assessment/Plan: 1. Poison ivy dermatitis Single dose depo-medrol of 40mg  given IM  in the office. Medrol dose pack to be started in next 24 to 48 hours if symptoms are not improving. Take as directed. Continue to use zenfel wash for poison ibvy/poison oak lesions. Apply triamcinolone 0.1% cream to all effected areas twice daily as needed for itching and irritation.  - methylPREDNISolone (MEDROL) 4 MG TBPK tablet; Take by mouth as directed for 6 days  Dispense: 21 tablet; Refill: 0 - triamcinolone cream (KENALOG) 0.1 %; Apply 1 application topically 2 (two) times daily.  Dispense: 80 g; Refill: 1 - methylPREDNISolone acetate (DEPO-MEDROL) injection 80 mg  2. Facet syndrome, lumbar May take celebrex 100mg  up to twice daily as needed for pain/inflammation.  - celecoxib (CELEBREX) 100 MG capsule; Take 1 capsule (100 mg total) by mouth 2 (two) times  daily.  Dispense: 60 capsule; Refill: 3  3. Hair loss May conitnue spironolactone 25mg  twice daily.   - spironolactone (ALDACTONE) 25 MG tablet; Take 1 tablet (25 mg total) by mouth 2 (two) times daily.  Dispense: 60 tablet; Refill: 3  4. Essential hypertension Generally stable. Continue bp medication as prescribed   General Counseling: marnisha stampley understanding of the findings of todays visit and agrees with plan of treatment. I have discussed any further diagnostic evaluation that may be needed or ordered today. We also reviewed her medications today. she has been encouraged to call the office with any questions or concerns that should arise related to todays visit.    Counseling:   This patient was seen by Joanna with Dr Lavera Guise as a part of collaborative care agreement  Meds ordered this encounter  Medications  . methylPREDNISolone (MEDROL) 4 MG TBPK tablet    Sig: Take by mouth as directed for 6 days    Dispense:  21 tablet    Refill:  0    Order Specific Question:   Supervising Provider    Answer:   Lavera Guise [place]  . celecoxib (CELEBREX) 100 MG capsule    Sig: Take 1 capsule  (100 mg total) by mouth 2 (two) times daily.    Dispense:  60 capsule    Refill:  3    Order Specific Question:   Supervising Provider    Answer:   Lavera Guise [[id number]]  . triamcinolone cream (KENALOG) 0.1 %    Sig: Apply 1 application topically 2 (two) times daily.    Dispense:  80 g    Refill:  1    Order Specific Question:   Supervising Provider    Answer:   Lavera Guise [[id number]]  . spironolactone (ALDACTONE) 25 MG tablet    Sig: Take 1 tablet (25 mg total) by mouth 2 (two) times daily.    Dispense:  60 tablet    Refill:  3    Order Specific Question:   Supervising Provider    Answer:   Lavera Guise [[id number]]  . methylPREDNISolone acetate (DEPO-MEDROL) injection 80 mg    Time spent: 30 Minutes

## 2020-05-19 ENCOUNTER — Other Ambulatory Visit: Payer: Self-pay

## 2020-05-19 ENCOUNTER — Other Ambulatory Visit: Payer: Self-pay | Admitting: Nurse Practitioner

## 2020-05-19 ENCOUNTER — Encounter: Payer: Self-pay | Admitting: Emergency Medicine

## 2020-05-19 ENCOUNTER — Ambulatory Visit
Admission: EM | Admit: 2020-05-19 | Discharge: 2020-05-19 | Disposition: A | Discharge status: Home / Self Care | Payer: Medicare HMO

## 2020-05-19 DIAGNOSIS — H60503 Unspecified acute noninfective otitis externa, bilateral: Secondary | ICD-10-CM

## 2020-05-19 DIAGNOSIS — B349 Viral infection, unspecified: Secondary | ICD-10-CM | POA: Diagnosis not present

## 2020-05-19 DIAGNOSIS — H9203 Otalgia, bilateral: Secondary | ICD-10-CM | POA: Diagnosis not present

## 2020-05-19 DIAGNOSIS — F99 Mental disorder, not otherwise specified: Secondary | ICD-10-CM

## 2020-05-19 DIAGNOSIS — J019 Acute sinusitis, unspecified: Secondary | ICD-10-CM

## 2020-05-19 DIAGNOSIS — R0981 Nasal congestion: Secondary | ICD-10-CM

## 2020-05-19 MED ORDER — AMOXICILLIN 875 MG PO TABS: 875.0000 mg | ORAL_TABLET | Freq: Two times a day (BID) | ORAL | 0 refills | Status: DC

## 2020-05-19 MED ORDER — CIPROFLOXACIN-DEXAMETHASONE 0.3-0.1 % OT SUSP: 4.0000 [drp] | Freq: Two times a day (BID) | OTIC | 0 refills | Status: DC

## 2020-05-19 NOTE — ED Provider Notes (Signed)
[facility]    CSN: [id number] Arrival date & time: 05/19/20  1437      History   Chief Complaint Chief Complaint  Patient presents with  . Otalgia    left    HPI Paula [name] is a 64 y.o. female.   64 year old female presents for 1 day history of sinus pressure, nasal congestion, sore throat, and ear pressure.  She admits to trying to gargle with salt water but not taking any decongestant medications.  She denies known Covid exposure and refuses to have Covid testing performed today.  She says she is fully vaccinated for Covid. Patient denies fever, fatigue, chills, sweats, headaches, cough, chest pain, breathing difficulty, abdominal pain, n/v/d.  No other concerns today.     Past Medical History:  Diagnosis Date  . Asthma   . Environmental allergies   . GERD (gastroesophageal reflux disease)   . Hypertension   . Migraine   . Sinusitis     Patient Active Problem List   Diagnosis Date Noted  . Encounter for routine adult health examination with abnormal findings 09/25/2019  . Routine cervical smear 09/25/2019  . Screening for osteoporosis 09/25/2019  . Essential hypertension 07/06/2019  . Generalized anxiety disorder 07/06/2019  . Elevated blood pressure reading in office without diagnosis of hypertension 04/06/2019  . Generalized abdominal discomfort 03/23/2019  . Screening for breast cancer 09/23/2018  . Other fatigue 09/23/2018  . Headache syndrome 09/23/2018  . Vitamin D deficiency 09/23/2018  . Fever blister 09/23/2018  . History of malignant melanoma of skin 09/23/2018  . Acute upper respiratory infection 08/24/2018  . Acute sinusitis, unspecified 09/21/2017  . Unspecified otitis externa, bilateral 09/21/2017  . Gastro-esophageal reflux disease without esophagitis 09/21/2017  . Insomnia, unspecified 09/21/2017  . Unspecified ovarian cyst, right side 09/21/2017  . Unspecified ovarian cyst, left side 09/21/2017  . Nasal mucositis (ulcerative)  09/21/2017  . Allergic rhinitis, unspecified 09/21/2017  . Cerebrovascular disease, unspecified 09/21/2017  . Herpesviral infection, unspecified 09/21/2017  . Varicella without complication 30/05/2329  . Dysuria 09/21/2017  . Abnormal weight gain 09/21/2017    Past Surgical History:  Procedure Laterality Date  . AUGMENTATION MAMMAPLASTY Bilateral 1987  . [place]  . skin cancer   08/11/2016   removal  . TUBAL LIGATION  08/11/2016    OB History   No obstetric history on file.      Home Medications    Prior to Admission medications   Medication Sig Start Date End Date Taking? Authorizing Provider  celecoxib (CELEBREX) 100 MG capsule Take 1 capsule (100 mg total) by mouth 2 (two) times daily. 05/14/20  Yes Boscia, Heather E, NP  LORazepam (ATIVAN) 0.5 MG tablet Take 1 tablet (0.5 mg total) by mouth 2 (two) times daily as needed for anxiety. 02/01/20  Yes Scarboro, Audie Clear, NP  amoxicillin (AMOXIL) 875 MG tablet Take 1 tablet (875 mg total) by mouth 2 (two) times daily. 05/19/20   Ronnell Freshwater, NP  ciprofloxacin-dexamethasone (CIPRODEX) OTIC suspension Place 4 drops into the right ear 2 (two) times daily. 05/19/20   Ronnell Freshwater, NP  methylPREDNISolone (MEDROL) 4 MG TBPK tablet Take by mouth as directed for 6 days 05/14/20   Ronnell Freshwater, NP  spironolactone (ALDACTONE) 25 MG tablet Take 1 tablet (25 mg total) by mouth 2 (two) times daily. 05/14/20   Ronnell Freshwater, NP  triamcinolone cream (KENALOG) 0.1 % Apply 1 application topically 2 (two) times daily. 05/14/20   Boscia,  Greer Ee, NP    Family History Family History  Problem Relation Age of Onset  . Breast cancer Cousin   . COPD Mother   . Ulcers Father     Social History Social History   Tobacco Use  . Smoking status: Former Smoker    Quit date: 12/01/2004    Years since quitting: 15.4  . Smokeless tobacco: Never Used  Vaping Use  . Vaping Use: Never used  Substance Use Topics  . Alcohol use:  Yes    Comment: ocassionally  . Drug use: No     Allergies   Patient has no known allergies.   Review of Systems Review of Systems  Constitutional: Negative for chills, diaphoresis, fatigue and fever.  HENT: Positive for congestion, ear pain, rhinorrhea, sinus pressure and sore throat. Negative for sinus pain.   Respiratory: Negative for cough and shortness of breath.   Gastrointestinal: Negative for abdominal pain, nausea and vomiting.  Musculoskeletal: Negative for arthralgias and myalgias.  Skin: Negative for rash.  Neurological: Negative for weakness and headaches.  Hematological: Negative for adenopathy.     Physical Exam Triage Vital Signs ED Triage Vitals  Enc Vitals Group     BP 05/19/20 1505 (!) 155/70     Pulse Rate 05/19/20 1505 63     Resp 05/19/20 1505 14     Temp 05/19/20 1505 98 F (36.7 C)     Temp Source 05/19/20 1505 Oral     SpO2 05/19/20 1505 99 %     Weight 05/19/20 1504 180 lb (81.6 kg)     Height 05/19/20 1504 5\' 3"  (1.6 m)     Head Circumference --      Peak Flow --      Pain Score 05/19/20 1503 3     Pain Loc --      Pain Edu? --      Excl. in [place]? --    No data found.  Updated Vital Signs BP (!) 155/70 (BP Location: Left Arm)   Pulse 63   Temp 98 F (36.7 C) (Oral)   Resp 14   Ht 5\' 3"  (1.6 m)   Wt 180 lb (81.6 kg)   SpO2 99%   BMI 31.89 kg/m        Physical Exam Vitals and nursing note reviewed.  Constitutional:      General: She is not in acute distress.    Appearance: Normal appearance. She is not ill-appearing or toxic-appearing.  HENT:     Head: Normocephalic and atraumatic.     Right Ear: Hearing, ear canal and external ear normal. A middle ear effusion is present. Tympanic membrane is not injected, erythematous, retracted or bulging.     Left Ear: Hearing, ear canal and external ear normal. A middle ear effusion is present. Tympanic membrane is not injected, erythematous, retracted or bulging.     Nose: Congestion and  rhinorrhea (trace clear drainage) present.     Right Sinus: No maxillary sinus tenderness or frontal sinus tenderness.     Left Sinus: No frontal sinus tenderness.     Mouth/Throat:     Mouth: Mucous membranes are moist.     Pharynx: Oropharynx is clear.     Comments: Clear post nasal drainage Eyes:     General: No scleral icterus.       Right eye: No discharge.        Left eye: No discharge.     Conjunctiva/sclera: Conjunctivae normal.  Cardiovascular:  Rate and Rhythm: Normal rate and regular rhythm.     Heart sounds: Normal heart sounds.  Pulmonary:     Effort: Pulmonary effort is normal. No respiratory distress.     Breath sounds: Normal breath sounds.  Musculoskeletal:     Cervical back: Neck supple.  Lymphadenopathy:     Cervical: No cervical adenopathy.  Skin:    General: Skin is dry.  Neurological:     General: No focal deficit present.     Mental Status: She is alert. Mental status is at baseline.     Motor: No weakness.     Gait: Gait normal.  Psychiatric:        Mood and Affect: Mood normal.        Behavior: Behavior normal.        Thought Content: Thought content normal.      UC Treatments / Results  Labs (all labs ordered are listed, but only abnormal results are displayed) Labs Reviewed - No data to display  EKG   Radiology No results found.  Procedures Procedures (including critical care time)  Medications Ordered in UC Medications - No data to display  Initial Impression / Assessment and Plan / UC Course  I have reviewed the triage vital signs and the nursing notes.  Pertinent labs & imaging results that were available during my care of the patient were reviewed by me and considered in my medical decision making (see chart for details).   Patient presenting for 1 day history of congestion and ear pressure.  Based on her exam I informed her there is a little bit of fluid behind her ears, but her ears do not appear to be infected.  Suggested  Covid testing, but she declined at this time.  Since she was complaining of a bit of a sore throat I also offered strep testing, but she declined as well.    Since she has not tried any decongestants or nasal sprays, I advised her to start taking Mucinex D, using Flonase and nasal saline.  Patient immediately became very frustrated and demanded an antibiotic.  I explained to her that she has only been ill for a day and has not tried any over-the-counter medications.  Her symptoms, exam, and vital signs all do not warrant an antibiotic at this time. I attempted to explain why use of an antibiotic is inappropriate for her at this point, but she cut me off.  If symptoms worsen it can be considered.  She asks again for antibiotic and says that if I do not give it to her she will just get it from someone else.  I explained to her that I cannot do that at this time but if symptoms worsen or she still has symptoms after a week it can be considered.  She left without completing the visit or receiving her discharge paperwork.  Final Clinical Impressions(s) / UC Diagnoses   Final diagnoses:  Viral illness  Otalgia of both ears  Nasal congestion   Discharge Instructions   None    ED Prescriptions    None     PDMP not reviewed this encounter.   Danton Clap, PA-C 05/20/20 2212

## 2020-05-19 NOTE — Progress Notes (Signed)
The patient called on-call line complaining of sore throat and ear infection. Sent in amoxicillin 875mg  twice daily for 10 days and added ciprodex ear drops. She should insert drops into both ears twice daily. Sent both prescriptions to [facility] in [place].

## 2020-05-19 NOTE — ED Triage Notes (Signed)
Patient c/o left ear pain and sinus pressure that started yesterday.  Patient denies fevers.

## 2020-05-26 DIAGNOSIS — L659 Nonscarring hair loss, unspecified: Secondary | ICD-10-CM | POA: Insufficient documentation

## 2020-05-26 DIAGNOSIS — M47816 Spondylosis without myelopathy or radiculopathy, lumbar region: Secondary | ICD-10-CM | POA: Insufficient documentation

## 2020-05-26 DIAGNOSIS — L237 Allergic contact dermatitis due to plants, except food: Secondary | ICD-10-CM | POA: Insufficient documentation

## 2020-06-11 ENCOUNTER — Ambulatory Visit: Payer: Medicare Other | Admitting: Nurse Practitioner

## 2020-06-13 ENCOUNTER — Ambulatory Visit (INDEPENDENT_AMBULATORY_CARE_PROVIDER_SITE_OTHER): Payer: Medicare HMO | Admitting: Nurse Practitioner

## 2020-06-13 ENCOUNTER — Encounter: Payer: Self-pay | Admitting: Nurse Practitioner

## 2020-06-13 ENCOUNTER — Other Ambulatory Visit: Payer: Self-pay

## 2020-06-13 VITALS — BP 142/78 | HR 71 | Resp 16 | Ht 63.0 in | Wt 187.0 lb

## 2020-06-13 DIAGNOSIS — J3481 Nasal mucositis (ulcerative): Secondary | ICD-10-CM

## 2020-06-13 DIAGNOSIS — I10 Essential (primary) hypertension: Secondary | ICD-10-CM | POA: Diagnosis not present

## 2020-06-13 DIAGNOSIS — J301 Allergic rhinitis due to pollen: Secondary | ICD-10-CM

## 2020-06-13 MED ORDER — FLUTICASONE PROPIONATE 50 MCG/ACT NA SUSP: 2.0000 | Freq: Every day | NASAL | 5 refills | Status: DC

## 2020-06-13 NOTE — Progress Notes (Signed)
[facility] [place], [place] [address]  Internal MEDICINE  Office Visit Note  Patient Name: Paula [name]  [id number]  [id number]  Date of Service: 06/26/2020  Chief Complaint  Patient presents with  . Follow-up  . Gastroesophageal Reflux  . Hypertension    The patient is here for follow up visit. She states that she is having significant issues with allergies. Has this happen every year at this time. States she is taking claritin in the morning and zyrtec at night. She does need refill of her flonase. Blood pressure is currently well managed.       Current Medication: Outpatient Encounter Medications as of 06/13/2020  Medication Sig  . celecoxib (CELEBREX) 100 MG capsule Take 1 capsule (100 mg total) by mouth 2 (two) times daily.  Marland Kitchen LORazepam (ATIVAN) 0.5 MG tablet Take 1 tablet (0.5 mg total) by mouth 2 (two) times daily as needed for anxiety.  Marland Kitchen spironolactone (ALDACTONE) 25 MG tablet Take 1 tablet (25 mg total) by mouth 2 (two) times daily.  Marland Kitchen triamcinolone cream (KENALOG) 0.1 % Apply 1 application topically 2 (two) times daily. (Patient not taking: Reported on 06/26/2020)  . [DISCONTINUED] ciprofloxacin-dexamethasone (CIPRODEX) OTIC suspension Place 4 drops into the right ear 2 (two) times daily.  . [DISCONTINUED] methylPREDNISolone (MEDROL) 4 MG TBPK tablet Take by mouth as directed for 6 days (Patient not taking: Reported on 06/20/2020)  . fluticasone (FLONASE) 50 MCG/ACT nasal spray Place 2 sprays into both nostrils daily.  . [DISCONTINUED] amoxicillin (AMOXIL) 875 MG tablet Take 1 tablet (875 mg total) by mouth 2 (two) times daily.   No facility-administered encounter medications on file as of 06/13/2020.    Surgical History: Past Surgical History:  Procedure Laterality Date  . AUGMENTATION MAMMAPLASTY Bilateral 1987  . [place]  . skin cancer   08/11/2016   removal  . TUBAL LIGATION  08/11/2016    Medical History: Past  Medical History:  Diagnosis Date  . Asthma   . Environmental allergies   . GERD (gastroesophageal reflux disease)   . Hypertension   . Migraine   . Sinusitis     Family History: Family History  Problem Relation Age of Onset  . Breast cancer Cousin   . COPD Mother   . Ulcers Father     Social History   Socioeconomic History  . Marital status: Married    Spouse name: Not on file  . Number of children: Not on file  . Years of education: Not on file  . Highest education level: Not on file  Occupational History  . Not on file  Tobacco Use  . Smoking status: Former Smoker    Quit date: 12/01/2004    Years since quitting: 15.5  . Smokeless tobacco: Never Used  Vaping Use  . Vaping Use: Never used  Substance and Sexual Activity  . Alcohol use: Yes    Comment: ocassionally  . Drug use: No  . Sexual activity: Not on file  Other Topics Concern  . Not on file  Social History Narrative  . Not on file   Social Determinants of Health   Financial Resource Strain:   . Difficulty of Paying Living Expenses: Not on file  Food Insecurity:   . Worried About Charity fundraiser in the Last Year: Not on file  . Ran Out of Food in the Last Year: Not on file  Transportation Needs:   . Lack of Transportation (Medical): Not on file  .  Lack of Transportation (Non-Medical): Not on file  Physical Activity:   . Days of Exercise per Week: Not on file  . Minutes of Exercise per Session: Not on file  Stress:   . Feeling of Stress : Not on file  Social Connections:   . Frequency of Communication with Friends and Family: Not on file  . Frequency of Social Gatherings with Friends and Family: Not on file  . Attends Religious Services: Not on file  . Active Member of Clubs or Organizations: Not on file  . Attends Archivist Meetings: Not on file  . Marital Status: Not on file  Intimate Partner Violence:   . Fear of Current or Ex-Partner: Not on file  . Emotionally Abused: Not on  file  . Physically Abused: Not on file  . Sexually Abused: Not on file      Review of Systems  Constitutional: Negative for activity change, chills, fatigue and unexpected weight change.  HENT: Positive for congestion and postnasal drip. Negative for rhinorrhea, sneezing and sore throat.   Respiratory: Negative for cough, chest tightness, shortness of breath and wheezing.   Cardiovascular: Negative for chest pain and palpitations.  Gastrointestinal: Negative for abdominal pain, constipation, diarrhea, nausea and vomiting.  Endocrine: Negative for cold intolerance, heat intolerance, polydipsia and polyuria.  Musculoskeletal: Negative for arthralgias, back pain, joint swelling and neck pain.  Skin: Negative for rash.  Allergic/Immunologic: Positive for environmental allergies.  Neurological: Negative for dizziness, tremors, numbness and headaches.  Hematological: Negative for adenopathy. Does not bruise/bleed easily.  Psychiatric/Behavioral: Negative for behavioral problems (Depression), sleep disturbance and suicidal ideas. The patient is not nervous/anxious.     Today's Vitals   06/13/20 0827  BP: (!) 142/78  Pulse: 71  Resp: 16  SpO2: 96%  Weight: 187 lb (84.8 kg)  Height: 5\' 3"  (1.6 m)   Body mass index is 33.13 kg/m.  Physical Exam Vitals and nursing note reviewed.  Constitutional:      General: She is not in acute distress.    Appearance: Normal appearance. She is well-developed. She is not diaphoretic.  HENT:     Head: Normocephalic and atraumatic.     Right Ear: Tympanic membrane is bulging.     Left Ear: Tympanic membrane is bulging.     Nose: Congestion present.     Mouth/Throat:     Pharynx: No oropharyngeal exudate.  Eyes:     Pupils: Pupils are equal, round, and reactive to light.  Neck:     Thyroid: No thyromegaly.     Vascular: No JVD.     Trachea: No tracheal deviation.  Cardiovascular:     Rate and Rhythm: Normal rate and regular rhythm.     Heart  sounds: Normal heart sounds. No murmur heard.  No friction rub. No gallop.   Pulmonary:     Effort: Pulmonary effort is normal. No respiratory distress.     Breath sounds: Normal breath sounds. No wheezing or rales.  Chest:     Chest wall: No tenderness.  Abdominal:     Palpations: Abdomen is soft.  Musculoskeletal:        General: Normal range of motion.     Cervical back: Normal range of motion and neck supple.  Lymphadenopathy:     Cervical: No cervical adenopathy.  Skin:    General: Skin is warm and dry.  Neurological:     General: No focal deficit present.     Mental Status: She is alert and oriented  to person, place, and time.     Cranial Nerves: No cranial nerve deficit.  Psychiatric:        Mood and Affect: Mood normal.        Behavior: Behavior normal.        Thought Content: Thought content normal.        Judgment: Judgment normal.   Assessment/Plan: 1. Essential hypertension Stable. Continue bp medication as prescribed   2. Nasal mucositis (ulcerative) Continue allergy medication as prescribed. Restart flonase nasal spray.   3. Allergic rhinitis due to pollen, unspecified seasonality Restart flonase. Use two sprays in both nostrils daily.  - fluticasone (FLONASE) 50 MCG/ACT nasal spray; Place 2 sprays into both nostrils daily.  Dispense: 16 g; Refill: 5  General Counseling: Antonea verbalizes understanding of the findings of todays visit and agrees with plan of treatment. I have discussed any further diagnostic evaluation that may be needed or ordered today. We also reviewed her medications today. she has been encouraged to call the office with any questions or concerns that should arise related to todays visit.   This patient was seen by Bennett with Dr Lavera Guise as a part of collaborative care agreement  Meds ordered this encounter  Medications  . fluticasone (FLONASE) 50 MCG/ACT nasal spray    Sig: Place 2 sprays into both nostrils  daily.    Dispense:  16 g    Refill:  5    Order Specific Question:   Supervising Provider    Answer:   Lavera Guise [[id number]]    Total time spent: 25 Minutes    Time spent includes review of chart, medications, test results, and follow up plan with the patient.      Dr Lavera Guise Internal medicine

## 2020-06-20 ENCOUNTER — Other Ambulatory Visit: Payer: Self-pay

## 2020-06-20 ENCOUNTER — Ambulatory Visit (INDEPENDENT_AMBULATORY_CARE_PROVIDER_SITE_OTHER): Payer: Medicare HMO | Admitting: Hospice and Palliative Medicine

## 2020-06-20 ENCOUNTER — Encounter: Payer: Self-pay | Admitting: Hospice and Palliative Medicine

## 2020-06-20 DIAGNOSIS — J019 Acute sinusitis, unspecified: Secondary | ICD-10-CM

## 2020-06-20 DIAGNOSIS — F411 Generalized anxiety disorder: Secondary | ICD-10-CM

## 2020-06-20 MED ORDER — AMOXICILLIN 500 MG PO CAPS: 500.0000 mg | ORAL_CAPSULE | Freq: Two times a day (BID) | ORAL | 0 refills | Status: DC

## 2020-06-20 NOTE — Progress Notes (Signed)
[facility] [place], [place] [address]  Internal MEDICINE  Telephone Visit  Patient Name: Paula [name]  [id number]  [id number]  Date of Service: 06/24/2020  I connected with the patient at 1443 by telephone and verified the patients identity using two identifiers.   I discussed the limitations, risks, security and privacy concerns of performing an evaluation and management service by telephone and the availability of in person appointments. I also discussed with the patient that there may be a patient responsible charge related to the service.  The patient expressed understanding and agrees to proceed.    Chief Complaint  Patient presents with  . Telephone Screen    virtual visit [phone]  . Telephone Assessment  . Sore Throat    started 3 days ago and getting worse.  no fever  . Ear Pain    feels like bugs crawling in both ears, been using ear drops from 05/19/20  . Fatigue  . Headache  . Cough    nasal congestion    HPI Patient is being seen today for a sick visit She complains of a sore throat that started about 3 days ago, she also has ear irritation, possible fluid build-up She also complains of a headache, nasal congestion and fatigue She denies sinus pain or pressure, shortness of breath or fevers She has been using ear drops for about 3 weeks for ear irritation but has not seen relief in her symptoms    Current Medication: Outpatient Encounter Medications as of 06/20/2020  Medication Sig  . celecoxib (CELEBREX) 100 MG capsule Take 1 capsule (100 mg total) by mouth 2 (two) times daily.  . fluticasone (FLONASE) 50 MCG/ACT nasal spray Place 2 sprays into both nostrils daily.  Marland Kitchen LORazepam (ATIVAN) 0.5 MG tablet Take 1 tablet (0.5 mg total) by mouth 2 (two) times daily as needed for anxiety.  Marland Kitchen spironolactone (ALDACTONE) 25 MG tablet Take 1 tablet (25 mg total) by mouth 2 (two) times daily.  Marland Kitchen triamcinolone cream (KENALOG) 0.1 % Apply 1  application topically 2 (two) times daily.  . [DISCONTINUED] ciprofloxacin-dexamethasone (CIPRODEX) OTIC suspension Place 4 drops into the right ear 2 (two) times daily.  Marland Kitchen amoxicillin (AMOXIL) 500 MG capsule Take 1 capsule (500 mg total) by mouth 2 (two) times daily.  . [DISCONTINUED] methylPREDNISolone (MEDROL) 4 MG TBPK tablet Take by mouth as directed for 6 days (Patient not taking: Reported on 06/20/2020)   No facility-administered encounter medications on file as of 06/20/2020.    Surgical History: Past Surgical History:  Procedure Laterality Date  . AUGMENTATION MAMMAPLASTY Bilateral 1987  . [place]  . skin cancer   08/11/2016   removal  . TUBAL LIGATION  08/11/2016    Medical History: Past Medical History:  Diagnosis Date  . Asthma   . Environmental allergies   . GERD (gastroesophageal reflux disease)   . Hypertension   . Migraine   . Sinusitis     Family History: Family History  Problem Relation Age of Onset  . Breast cancer Cousin   . COPD Mother   . Ulcers Father     Social History   Socioeconomic History  . Marital status: Married    Spouse name: Not on file  . Number of children: Not on file  . Years of education: Not on file  . Highest education level: Not on file  Occupational History  . Not on file  Tobacco Use  . Smoking status: Former Smoker  Quit date: 12/01/2004    Years since quitting: 15.5  . Smokeless tobacco: Never Used  Vaping Use  . Vaping Use: Never used  Substance and Sexual Activity  . Alcohol use: Yes    Comment: ocassionally  . Drug use: No  . Sexual activity: Not on file  Other Topics Concern  . Not on file  Social History Narrative  . Not on file   Social Determinants of Health   Financial Resource Strain:   . Difficulty of Paying Living Expenses: Not on file  Food Insecurity:   . Worried About Charity fundraiser in the Last Year: Not on file  . Ran Out of Food in the Last Year: Not on file   Transportation Needs:   . Lack of Transportation (Medical): Not on file  . Lack of Transportation (Non-Medical): Not on file  Physical Activity:   . Days of Exercise per Week: Not on file  . Minutes of Exercise per Session: Not on file  Stress:   . Feeling of Stress : Not on file  Social Connections:   . Frequency of Communication with Friends and Family: Not on file  . Frequency of Social Gatherings with Friends and Family: Not on file  . Attends Religious Services: Not on file  . Active Member of Clubs or Organizations: Not on file  . Attends Archivist Meetings: Not on file  . Marital Status: Not on file  Intimate Partner Violence:   . Fear of Current or Ex-Partner: Not on file  . Emotionally Abused: Not on file  . Physically Abused: Not on file  . Sexually Abused: Not on file   Review of Systems  Constitutional: Negative for chills, diaphoresis and fatigue.  HENT: Positive for congestion, ear pain and sore throat. Negative for postnasal drip and sinus pressure.   Eyes: Negative for photophobia, discharge, redness, itching and visual disturbance.  Respiratory: Negative for cough, shortness of breath and wheezing.   Cardiovascular: Negative for chest pain, palpitations and leg swelling.  Gastrointestinal: Negative for abdominal pain, constipation, diarrhea, nausea and vomiting.  Genitourinary: Negative for dysuria and flank pain.  Musculoskeletal: Negative for arthralgias, back pain, gait problem and neck pain.  Skin: Negative for color change.  Allergic/Immunologic: Negative for environmental allergies and food allergies.  Neurological: Positive for headaches. Negative for dizziness.  Hematological: Does not bruise/bleed easily.  Psychiatric/Behavioral: Negative for agitation, behavioral problems (depression) and hallucinations.    Vital Signs: Resp 16   Ht 5\' 3"  (1.6 m)   Wt 187 lb (84.8 kg)   BMI 33.13 kg/m    Observation/Objective: No acute distress  noted  Assessment/Plan: 1. Acute sinusitis, recurrence not specified, unspecified location Will treat with amoxicillin, follow-up in office in 1 week, advised to contact office if symptoms worsen - amoxicillin (AMOXIL) 500 MG capsule; Take 1 capsule (500 mg total) by mouth 2 (two) times daily.  Dispense: 10 capsule; Refill: 0  2. Generalized anxiety disorder Anxiety well controlled at this time, continue with current therapy and monitoring  _Will need to update labs at follow-up appointment, has physical January 2021  General Counseling: cybill uriegas understanding of the findings of today's phone visit and agrees with plan of treatment. I have discussed any further diagnostic evaluation that may be needed or ordered today. We also reviewed her medications today. she has been encouraged to call the office with any questions or concerns that should arise related to todays visit.      Meds ordered this  encounter  Medications  . amoxicillin (AMOXIL) 500 MG capsule    Sig: Take 1 capsule (500 mg total) by mouth 2 (two) times daily.    Dispense:  10 capsule    Refill:  0     Time spent: [place]. Damiyah Ditmars AGNP-C Internal medicine

## 2020-06-24 ENCOUNTER — Encounter: Payer: Self-pay | Admitting: Hospice and Palliative Medicine

## 2020-06-26 ENCOUNTER — Ambulatory Visit (INDEPENDENT_AMBULATORY_CARE_PROVIDER_SITE_OTHER): Payer: Medicare HMO | Admitting: Nurse Practitioner

## 2020-06-26 ENCOUNTER — Other Ambulatory Visit: Payer: Self-pay

## 2020-06-26 ENCOUNTER — Encounter: Payer: Self-pay | Admitting: Nurse Practitioner

## 2020-06-26 VITALS — BP 138/74 | HR 78 | Temp 98.3°F | Resp 16 | Ht 63.0 in | Wt 191.4 lb

## 2020-06-26 DIAGNOSIS — H60503 Unspecified acute noninfective otitis externa, bilateral: Secondary | ICD-10-CM | POA: Diagnosis not present

## 2020-06-26 DIAGNOSIS — J301 Allergic rhinitis due to pollen: Secondary | ICD-10-CM | POA: Diagnosis not present

## 2020-06-26 DIAGNOSIS — J019 Acute sinusitis, unspecified: Secondary | ICD-10-CM | POA: Diagnosis not present

## 2020-06-26 MED ORDER — NEOMYCIN-POLYMYXIN-HC 3.5-10000-1 OP SUSP: OPHTHALMIC | 0 refills | Status: DC

## 2020-06-26 NOTE — Telephone Encounter (Signed)
Pt called that ear we send is expensive as per heather change to Ofloxacin

## 2020-06-26 NOTE — Progress Notes (Signed)
[facility] [place], [place] [address]  Internal MEDICINE  Office Visit Note  Patient Name: Paula [name]  [id number]  [id number]  Date of Service: 06/26/2020  Chief Complaint  Patient presents with   Follow-up    1 week   controlled substance policy    acknowledged   Quality Metric Gaps    Hep C screen, HIV screen, flu vacc    The patient was seen 06/20/2020 due to sinusitis. She was treated with amoxicillin twice daily. She states that she has two more doses to take. She states that her throat is feeling better and sinuses are mostly clear. Headache is minimal if at all. She states that her ears are still pretty tender. She started using the ciprodex ear drops, previously prescribed. States that this has not done a lot of good. Ears are both tender and sore. She denies new symptoms of concerns today.       Current Medication: Outpatient Encounter Medications as of 06/26/2020  Medication Sig   amoxicillin (AMOXIL) 500 MG capsule Take 1 capsule (500 mg total) by mouth 2 (two) times daily.   celecoxib (CELEBREX) 100 MG capsule Take 1 capsule (100 mg total) by mouth 2 (two) times daily.   fluticasone (FLONASE) 50 MCG/ACT nasal spray Place 2 sprays into both nostrils daily.   LORazepam (ATIVAN) 0.5 MG tablet Take 1 tablet (0.5 mg total) by mouth 2 (two) times daily as needed for anxiety.   spironolactone (ALDACTONE) 25 MG tablet Take 1 tablet (25 mg total) by mouth 2 (two) times daily.   neomycin-polymyxin-hydrocortisone (CORTISPORIN) 3.5-10000-1 ophthalmic suspension Insert 4 drops into both ears twice daily for 7 days.   triamcinolone cream (KENALOG) 0.1 % Apply 1 application topically 2 (two) times daily. (Patient not taking: Reported on 06/26/2020)   No facility-administered encounter medications on file as of 06/26/2020.    Surgical History: Past Surgical History:  Procedure Laterality Date   AUGMENTATION MAMMAPLASTY Bilateral [place]   skin cancer   08/11/2016   removal   TUBAL LIGATION  08/11/2016    Medical History: Past Medical History:  Diagnosis Date   Asthma    Environmental allergies    GERD (gastroesophageal reflux disease)    Hypertension    Migraine    Sinusitis     Family History: Family History  Problem Relation Age of Onset   Breast cancer Cousin    COPD Mother    Ulcers Father     Social History   Socioeconomic History   Marital status: Married    Spouse name: Not on file   Number of children: Not on file   Years of education: Not on file   Highest education level: Not on file  Occupational History   Not on file  Tobacco Use   Smoking status: Former Smoker    Quit date: 12/01/2004    Years since quitting: 15.5   Smokeless tobacco: Never Used  Vaping Use   Vaping Use: Never used  Substance and Sexual Activity   Alcohol use: Yes    Comment: ocassionally   Drug use: No   Sexual activity: Not on file  Other Topics Concern   Not on file  Social History Narrative   Not on file   Social Determinants of Health   Financial Resource Strain:    Difficulty of Paying Living Expenses: Not on file  Food Insecurity:    Worried About Charity fundraiser in  the Last Year: Not on file   Ran Out of Food in the Last Year: Not on file  Transportation Needs:    Lack of Transportation (Medical): Not on file   Lack of Transportation (Non-Medical): Not on file  Physical Activity:    Days of Exercise per Week: Not on file   Minutes of Exercise per Session: Not on file  Stress:    Feeling of Stress : Not on file  Social Connections:    Frequency of Communication with Friends and Family: Not on file   Frequency of Social Gatherings with Friends and Family: Not on file   Attends Religious Services: Not on file   Active Member of Clubs or Organizations: Not on file   Attends Archivist Meetings: Not on file   Marital Status:  Not on file  Intimate Partner Violence:    Fear of Current or Ex-Partner: Not on file   Emotionally Abused: Not on file   Physically Abused: Not on file   Sexually Abused: Not on file      Review of Systems  Constitutional: Negative for activity change, chills, fatigue and unexpected weight change.  HENT: Positive for ear pain. Negative for congestion, postnasal drip, rhinorrhea, sneezing and sore throat.   Respiratory: Negative for cough, chest tightness, shortness of breath and wheezing.   Cardiovascular: Negative for chest pain and palpitations.  Gastrointestinal: Negative for abdominal pain, constipation, diarrhea, nausea and vomiting.  Musculoskeletal: Negative for arthralgias, back pain, joint swelling and neck pain.  Skin: Negative for rash.  Allergic/Immunologic: Positive for environmental allergies.  Neurological: Positive for headaches. Negative for tremors and numbness.  Hematological: Negative for adenopathy. Does not bruise/bleed easily.  Psychiatric/Behavioral: Negative for behavioral problems (Depression), sleep disturbance and suicidal ideas. The patient is not nervous/anxious.     Today's Vitals   06/26/20 0958  BP: 138/74  Pulse: 78  Resp: 16  Temp: 98.3 F (36.8 C)  SpO2: 98%  Weight: 191 lb 6.4 oz (86.8 kg)  Height: 5\' 3"  (1.6 m)   Body mass index is 33.9 kg/m.  Physical Exam Vitals and nursing note reviewed.  Constitutional:      General: She is not in acute distress.    Appearance: Normal appearance. She is well-developed. She is not diaphoretic.  HENT:     Head: Normocephalic and atraumatic.     Right Ear: Tympanic membrane is erythematous and bulging.     Left Ear: Tympanic membrane is erythematous and bulging.     Ears:     Comments: Right ear slightly more severe than left.     Nose: Nose normal.     Right Sinus: No maxillary sinus tenderness or frontal sinus tenderness.     Left Sinus: No maxillary sinus tenderness or frontal sinus  tenderness.     Mouth/Throat:     Pharynx: No oropharyngeal exudate.  Eyes:     Pupils: Pupils are equal, round, and reactive to light.  Neck:     Thyroid: No thyromegaly.     Vascular: No JVD.     Trachea: No tracheal deviation.  Cardiovascular:     Rate and Rhythm: Normal rate and regular rhythm.     Heart sounds: Normal heart sounds. No murmur heard.  No friction rub. No gallop.   Pulmonary:     Effort: Pulmonary effort is normal. No respiratory distress.     Breath sounds: No wheezing or rales.  Chest:     Chest wall: No tenderness.  Abdominal:  General: Bowel sounds are normal.     Palpations: Abdomen is soft.  Musculoskeletal:        General: Normal range of motion.     Cervical back: Normal range of motion and neck supple.  Lymphadenopathy:     Cervical: No cervical adenopathy.  Skin:    General: Skin is warm and dry.  Neurological:     Mental Status: She is alert and oriented to person, place, and time.     Cranial Nerves: No cranial nerve deficit.  Psychiatric:        Behavior: Behavior normal.        Thought Content: Thought content normal.        Judgment: Judgment normal.    Assessment/Plan: 1. Acute sinusitis, recurrence not specified, unspecified location Improved, nearly resolved. Recommend she finish previously prescribed antibiotics.   2. Acute otitis externa of both ears, unspecified type Trial of cortisporin ear drops. Insert four drops in both ears twice daily for next week.  - neomycin-polymyxin-hydrocortisone (CORTISPORIN) 3.5-10000-1 ophthalmic suspension; Insert 4 drops into both ears twice daily for 7 days.  Dispense: 7.5 mL; Refill: 0  3. Allergic rhinitis due to pollen, unspecified seasonality continue allergy medication as prescribed.   General Counseling: nishita isaacks understanding of the findings of todays visit and agrees with plan of treatment. I have discussed any further diagnostic evaluation that may be needed or ordered  today. We also reviewed her medications today. she has been encouraged to call the office with any questions or concerns that should arise related to todays visit.   This patient was seen by Leretha Pol FNP Collaboration with Dr Lavera Guise as a part of collaborative care agreement  Meds ordered this encounter  Medications   neomycin-polymyxin-hydrocortisone (CORTISPORIN) 3.5-10000-1 ophthalmic suspension    Sig: Insert 4 drops into both ears twice daily for 7 days.    Dispense:  7.5 mL    Refill:  0    Insurance prefer she use ophthalmic drops.    Order Specific Question:   Supervising Provider    Answer:   Lavera Guise [[id number]]    Total time spent: 25 Minutes   Time spent includes review of chart, medications, test results, and follow up plan with the patient.      Dr Lavera Guise Internal medicine

## 2020-07-22 IMAGING — MG DIGITAL SCREENING BREAST BILAT IMPLANT W/ TOMO W/ CAD
8 of 13 series · 8 of 29 positions shown · non-contrast
Comparison: Previous exam(s).

CLINICAL DATA: Screening.

EXAM:
DIGITAL SCREENING BILATERAL MAMMOGRAM WITH IMPLANTS, CAD AND TOMO
The patient has retropectoral implants. Standard and implant
displaced views were performed.

[L CC]
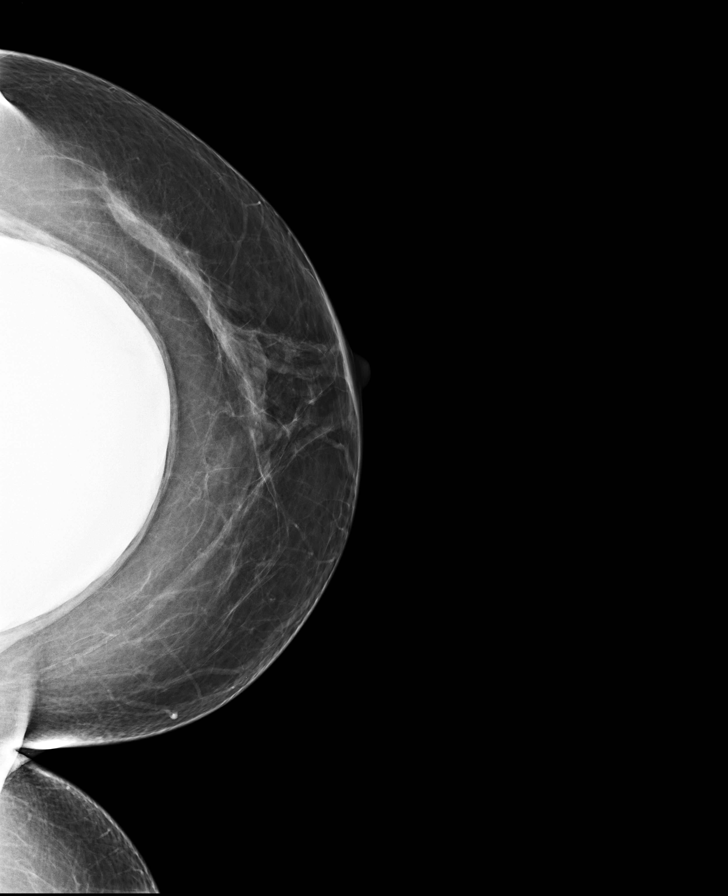

[R MLO]
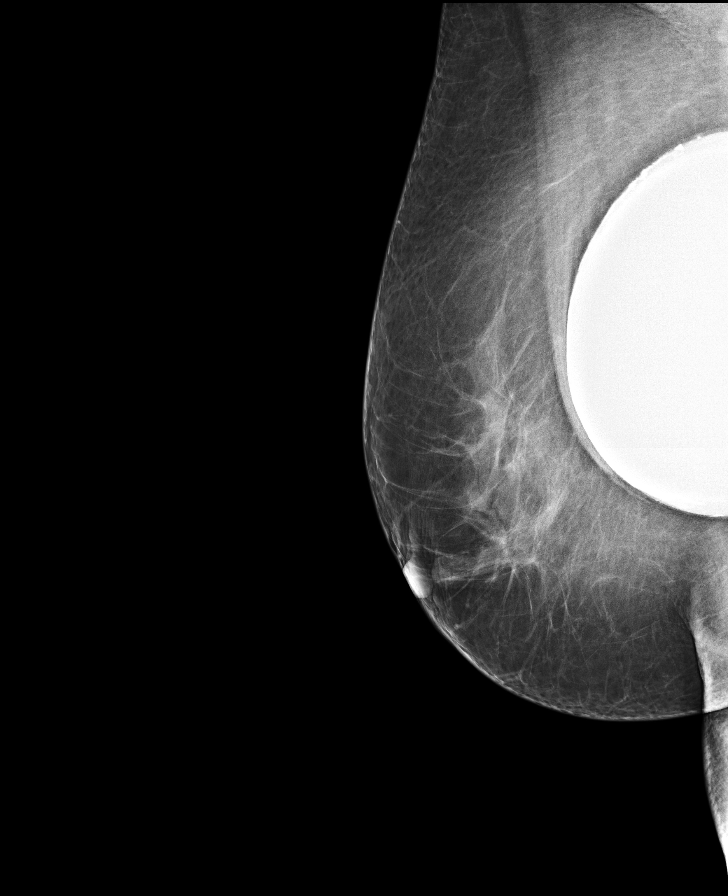

[R CC (1 of 2)]
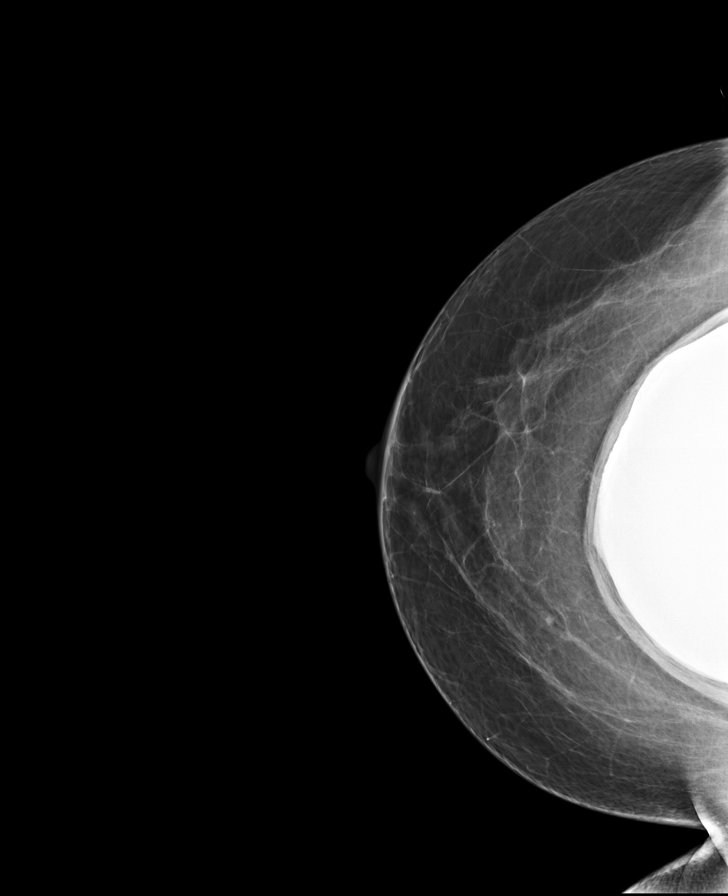

[L MLO]
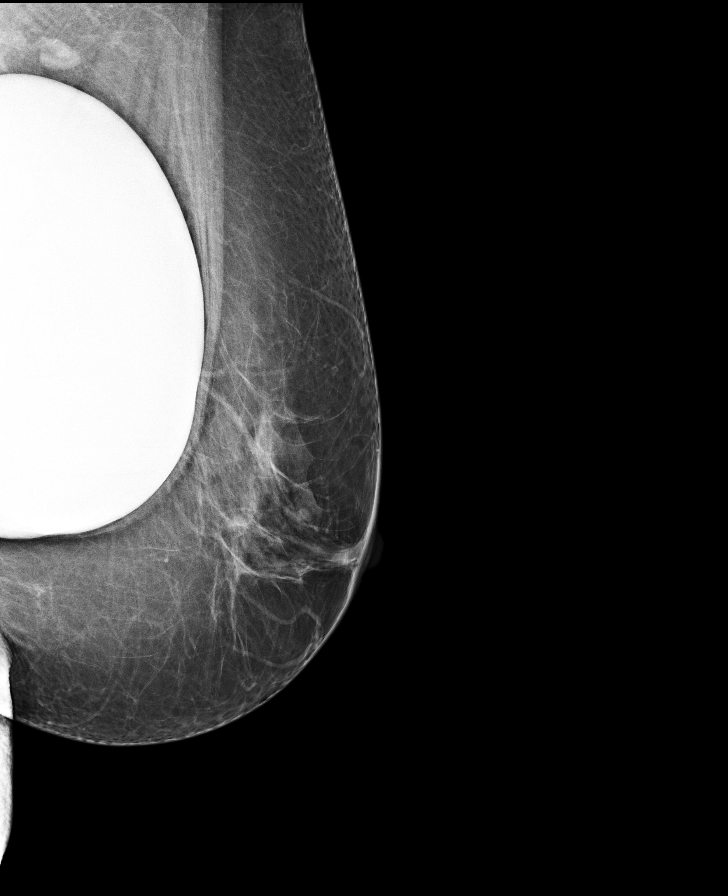

[R CC (2 of 2)]
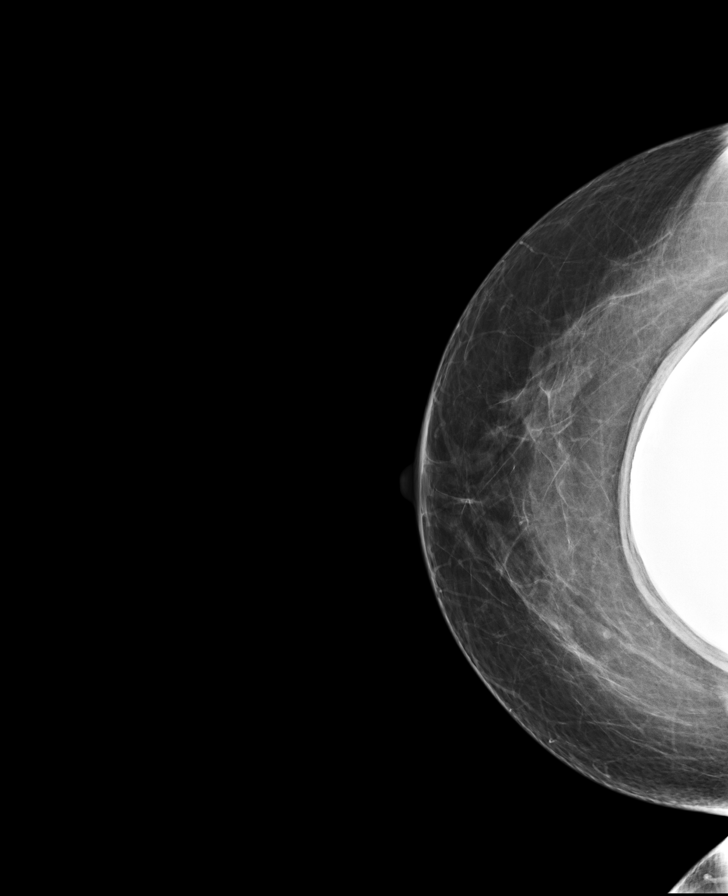

[R MLO synth-2D]
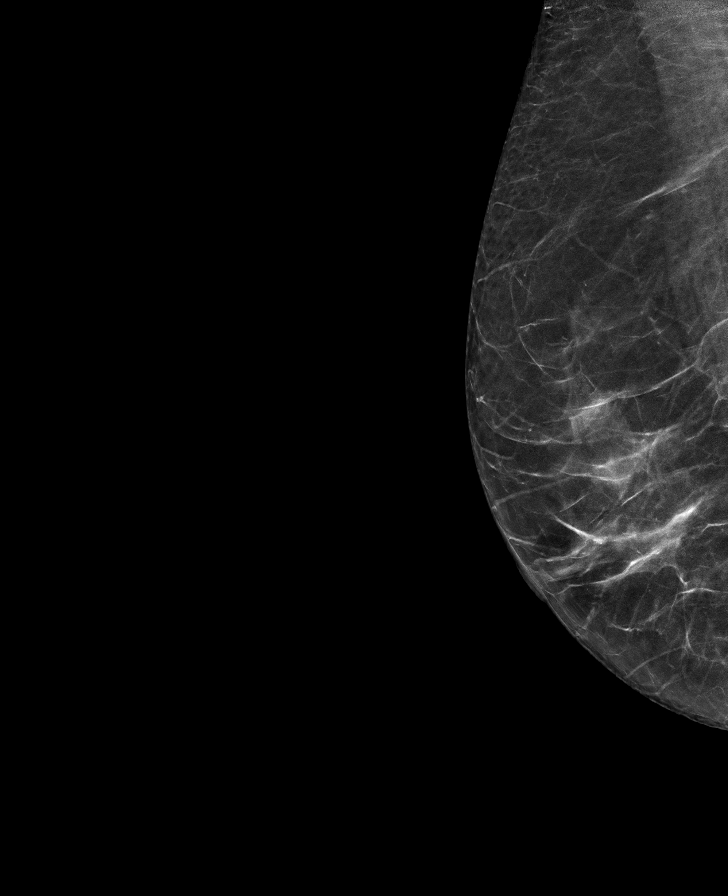

[L CC synth-2D]
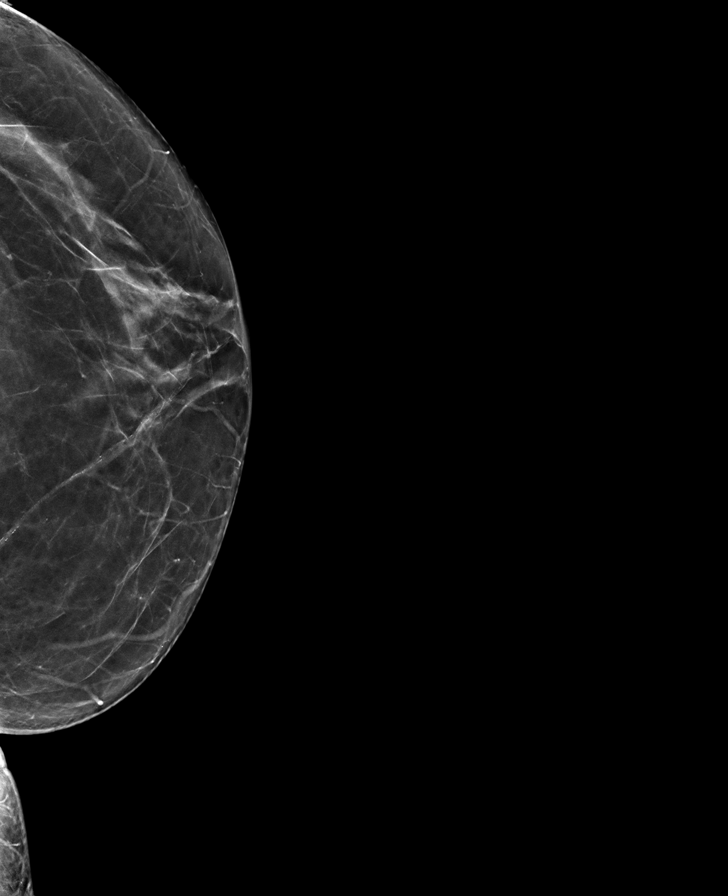

[R CC synth-2D]
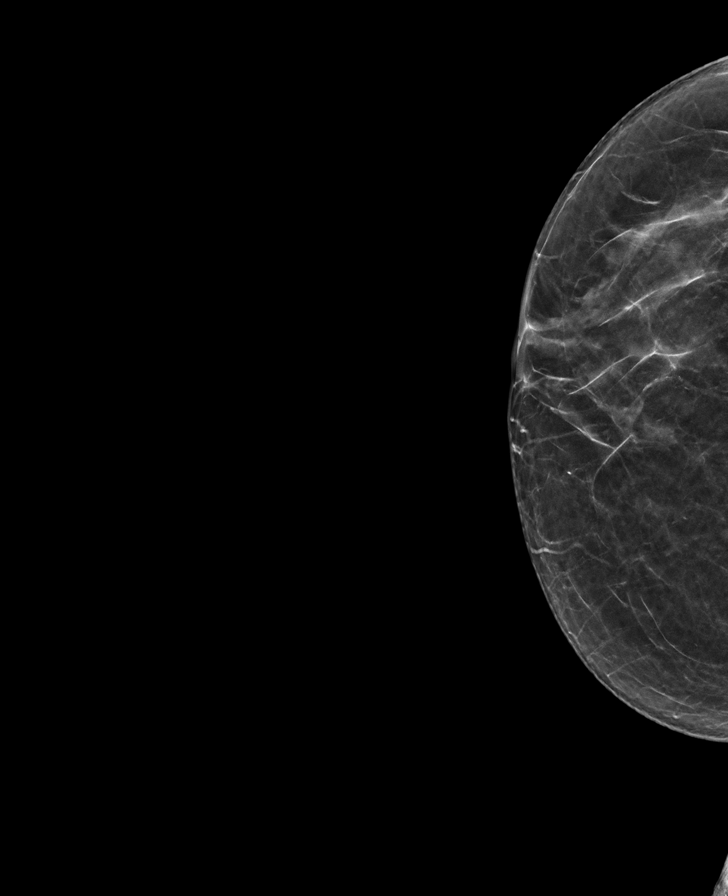

[8 of 29 positions shown; findings below may reference images not displayed]

ACR Breast Density Category b: There are scattered areas of
fibroglandular density.
FINDINGS: There are no findings suspicious for malignancy. Images were
processed with CAD.
IMPRESSION: No mammographic evidence of malignancy. A result letter of this
screening mammogram will be mailed directly to the patient.

RECOMMENDATION:
Screening mammogram in one year. (Code:60-T-8Z4)

BI-RADS CATEGORY  1:  Negative.

## 2020-09-04 ENCOUNTER — Telehealth: Payer: Self-pay | Admitting: Nurse Practitioner

## 2020-09-04 ENCOUNTER — Other Ambulatory Visit: Payer: Self-pay | Admitting: Nurse Practitioner

## 2020-09-04 DIAGNOSIS — J029 Acute pharyngitis, unspecified: Secondary | ICD-10-CM

## 2020-09-04 MED ORDER — AMOXICILLIN 875 MG PO TABS: 875.0000 mg | ORAL_TABLET | Freq: Two times a day (BID) | ORAL | 0 refills | Status: DC

## 2020-09-04 NOTE — Telephone Encounter (Signed)
Please let her know that I sent in prescription for amoxicillin twice daily for 10 days. She should take tylenol and/or ibuprofen for pain and fever. Gargle with warm salt water as needed for sore throat. Rest and drink plenty of fluids. Thanks.

## 2020-09-04 NOTE — Telephone Encounter (Signed)
Patient called stating she is having the same symptoms as her spouse who was seen yesterday in office. Can we send her something into her pharmacy to treat her?

## 2020-09-05 ENCOUNTER — Ambulatory Visit
Admission: EM | Admit: 2020-09-05 | Discharge: 2020-09-05 | Disposition: A | Discharge status: Home / Self Care | Payer: Medicare HMO | Attending: Emergency Medicine | Admitting: Emergency Medicine

## 2020-09-05 ENCOUNTER — Encounter: Payer: Self-pay | Admitting: Emergency Medicine

## 2020-09-05 ENCOUNTER — Other Ambulatory Visit: Payer: Self-pay

## 2020-09-05 DIAGNOSIS — Z20822 Contact with and (suspected) exposure to covid-19: Secondary | ICD-10-CM | POA: Insufficient documentation

## 2020-09-05 DIAGNOSIS — J029 Acute pharyngitis, unspecified: Secondary | ICD-10-CM | POA: Diagnosis not present

## 2020-09-05 DIAGNOSIS — J069 Acute upper respiratory infection, unspecified: Secondary | ICD-10-CM

## 2020-09-05 LAB — GROUP A STREP BY PCR: Group A Strep by PCR: NOT DETECTED

## 2020-09-05 LAB — RESP PANEL BY RT-PCR (FLU A&B, COVID) ARPGX2
Influenza A by PCR: NEGATIVE
Influenza B by PCR: NEGATIVE
SARS Coronavirus 2 by RT PCR: NEGATIVE

## 2020-09-05 MED ORDER — LIDOCAINE VISCOUS HCL 2 % MT SOLN
15.0000 mL | Freq: Once | OROMUCOSAL | Status: AC
  Administered 2020-09-05: 15 mL via OROMUCOSAL

## 2020-09-05 MED ORDER — HYDROCOD POLST-CPM POLST ER 10-8 MG/5ML PO SUER: 5.0000 mL | Freq: Two times a day (BID) | ORAL | 0 refills | Status: DC | PRN

## 2020-09-05 MED ORDER — AZELASTINE HCL 0.1 % NA SOLN: 2.0000 | Freq: Two times a day (BID) | NASAL | 0 refills | Status: DC

## 2020-09-05 MED ORDER — MOMETASONE FUROATE 50 MCG/ACT NA SUSP: 2.0000 | Freq: Every day | NASAL | 0 refills | Status: DC

## 2020-09-05 MED ORDER — IBUPROFEN 600 MG PO TABS: 600.0000 mg | ORAL_TABLET | Freq: Four times a day (QID) | ORAL | 0 refills | Status: DC | PRN

## 2020-09-05 NOTE — ED Triage Notes (Signed)
Patient in today c/o bilateral ear pain, sore throat and cough x 3-4 days. Patient has had fever (100.8). Patient has taken OTC Ibuprofen,Tylenol and Chloraseptic lozenges. Patient's PCP called in an antibiotic (Amoxicillin 875mg ) on 09/04/20 and advised to use Ciprofloxacin.3%/Dexamethasone 0.1%. Patient states medications are not helping. Patient states the sore throat is getting worse. Patient has had the covid vaccines. Patient's took an in home covid test today and it was negative.

## 2020-09-05 NOTE — Discharge Instructions (Addendum)
Your strep, Covid and flu PCR test were all negative. Stop Flonase. Start Nasonex and the Astelin. Start Mucinex, saline nasal irrigation with a Milta Deiters Med rinse and distilled water as often as you want. 1 gram of Tylenol and 600 mg ibuprofen together 3-4 times a day as needed for pain.  Make sure you drink plenty of extra fluids.  Some people find salt water gargles and  Traditional Medicinal's "Throat Coat" tea helpful. Take 5 mL of liquid Benadryl and 5 mL of Maalox. Mix it together, and then hold it in your mouth for as long as you can and then swallow. You may do this 4 times a day.    I would stop the amoxicillin as I am not sure exactly what we are treating with it.  The cough syrup has hydrocodone in it, which is a narcotic. It may make you very sleepy and constipated. It will however also help your throat in addition to stopping your cough. Do not take lorazepam if you are taking the cough syrup, as this can slow your breathing down to dangerous levels.  Go to www.goodrx.com to look up your medications. This will give you a list of where you can find your prescriptions at the most affordable prices. Or ask the pharmacist what the cash price is, or if they have any other discount programs available to help make your medication more affordable. This can be less expensive than what you would pay with insurance.

## 2020-09-05 NOTE — ED Provider Notes (Signed)
HPI  SUBJECTIVE:  Patient reports sore throat, bilateral ear pressure starting 2 days ago. Sx worse with swallowing.  States like it feels like she is "swallowing razors" every time she swallows.  States that she was unable to sleep last night due to the pain.  Sx better with ibuprofen and Tylenol.  Has been taking 400 mg of ibuprofen alternating with 500 mg of Tylenol twice a day.  Has also tried some leftover Ciprodex eardrops for her ears. + Fever tmax 100.8   No neck stiffness  + Cough + nasal congestion, rhinorrhea, postnasal drip No sinus pain or pressure No Myalgias No Headache No Rash No otorrhea, change in hearing  No loss of taste or smell No shortness of breath or difficulty breathing No nausea, vomiting No diarrhea No abdominal pain     No Recent Strep, mono, flu, COVID exposure No reflux sxs No Allergy sxs  No Breathing difficulty, muffled voice, sensation of throat swelling shut No Drooling No Trismus No abx in past month. Had a negative home Covid test today Patient has not been tested for Covid vaccine + antipyretic in past 4-6 hrs-Tylenol She contacted her primary care provider yesterday and she is on day number 2 out of 10 of amoxicillin 875 mg twice daily.  States that her throat is getting worse She has a past medical history of allergies and GERD and states that they are not bothering her at this time.  She has a history of hypertension, allergic rhinitis, and sinusitis 2-3 times a year.  Denies history of asthma although this is listed in her medical problem list.  No history of chronic kidney disease, diabetes.  She is not a smoker PMD: Dr. Chancy Milroy    Past Medical History:  Diagnosis Date  . Asthma   . Environmental allergies   . GERD (gastroesophageal reflux disease)   . Hypertension   . Migraine   . Sinusitis     Past Surgical History:  Procedure Laterality Date  . AUGMENTATION MAMMAPLASTY Bilateral 1987  . [place]  . skin cancer    08/11/2016   removal  . TUBAL LIGATION  08/11/2016    Family History  Problem Relation Age of Onset  . Breast cancer Cousin   . COPD Mother   . Lung cancer Mother   . Hypertension Mother   . Ulcers Father   . Bowel Disease Father        blockage, sepsis    Social History   Tobacco Use  . Smoking status: Former Smoker    Quit date: 12/01/2004    Years since quitting: 15.7  . Smokeless tobacco: Never Used  Vaping Use  . Vaping Use: Never used  Substance Use Topics  . Alcohol use: Yes    Comment: ocassionally  . Drug use: No    No current facility-administered medications for this encounter.  Current Outpatient Medications:  .  spironolactone (ALDACTONE) 25 MG tablet, Take 1 tablet (25 mg total) by mouth 2 (two) times daily., Disp: 60 tablet, Rfl: 3 .  azelastine (ASTELIN) 0.1 % nasal spray, Place 2 sprays into both nostrils 2 (two) times daily., Disp: 30 mL, Rfl: 0 .  chlorpheniramine-HYDROcodone (TUSSIONEX PENNKINETIC ER) 10-8 MG/5ML SUER, Take 5 mLs by mouth every 12 (twelve) hours as needed for cough., Disp: 60 mL, Rfl: 0 .  ibuprofen (ADVIL) 600 MG tablet, Take 1 tablet (600 mg total) by mouth every 6 (six) hours as needed., Disp: 30 tablet, Rfl: 0 .  mometasone (NASONEX) 50 MCG/ACT nasal spray, Place 2 sprays into the nose daily., Disp: 17 g, Rfl: 0  No Known Allergies   ROS  As noted in HPI.   Physical Exam  BP (!) 162/73 (BP Location: Left Arm)   Pulse 97   Temp 99.4 F (37.4 C) (Oral)   Resp 18   Ht 5\' 3"  (1.6 m)   Wt 79.4 kg   SpO2 97%   BMI 31.00 kg/m   Constitutional: Well developed, well nourished, peers uncomfortable. Coughing. Eyes:  EOMI, conjunctiva normal bilaterally HENT: Normocephalic, atraumatic,mucus membranes moist. + Mucoid nasal congestion, erythematous, swollen turbinates.  No maxillary, frontal sinus tenderness. Intensely erythematous oropharynx, tonsils normal without exudates. No ulcers. Uvula midline.  Extensive postnasal drip.   No drooling, trismus.  Raspy, but not muffled voice. Respiratory: Normal inspiratory effort, scattered rhonchi that clear with coughing.  No wheezing, rales.  Good air movement Cardiovascular: Normal rate, no murmurs, rubs, gallops GI: nondistended, nontender. No appreciable splenomegaly skin: No rash, skin intact Lymph: + Anterior cervical LN.  No posterior cervical lymphadenopathy Musculoskeletal: no deformities Neurologic: Alert & oriented x 3, no focal neuro deficits Psychiatric: Speech and behavior appropriate.  ED Course   Medications  lidocaine (XYLOCAINE) 2 % viscous mouth solution 15 mL (15 mLs Mouth/Throat Given 09/05/20 2049)    Orders Placed This Encounter  Procedures  . Group A Strep by PCR    Standing Status:   Standing    Number of Occurrences:   1  . Resp Panel by RT-PCR (Flu A&B, Covid) Nasopharyngeal Swab    Standing Status:   Standing    Number of Occurrences:   1    Order Specific Question:   Is this test for diagnosis or screening    Answer:   Diagnosis of ill patient    Order Specific Question:   Symptomatic for COVID-19 as defined by CDC    Answer:   Yes    Order Specific Question:   Date of Symptom Onset    Answer:   09/01/2020    Order Specific Question:   Hospitalized for COVID-19    Answer:   No    Order Specific Question:   Admitted to ICU for COVID-19    Answer:   No    Order Specific Question:   Previously tested for COVID-19    Answer:   No    Order Specific Question:   Resident in a congregate (group) care setting    Answer:   No    Order Specific Question:   Employed in healthcare setting    Answer:   No    Order Specific Question:   Pregnant    Answer:   No    Order Specific Question:   Has patient completed COVID vaccination(s) (2 doses of Pfizer/Moderna 1 dose of [employer])    Answer:   Yes  . Airborne precautions    Standing Status:   Standing    Number of Occurrences:   1    Results for orders placed or performed during  the hospital encounter of 09/05/20 (from the past 24 hour(s))  Group A Strep by PCR     Status: None   Collection Time: 09/05/20  8:10 PM   Specimen: Throat; Sterile Swab  Result Value Ref Range   Group A Strep by PCR NOT DETECTED NOT DETECTED  Resp Panel by RT-PCR (Flu A&B, Covid) Throat     Status: None   Collection Time: 09/05/20  8:10 PM   Specimen: Throat; Nasopharyngeal(NP) swabs in vial transport medium  Result Value Ref Range   SARS Coronavirus 2 by RT PCR NEGATIVE NEGATIVE   Influenza A by PCR NEGATIVE NEGATIVE   Influenza B by PCR NEGATIVE NEGATIVE   No results found.  ED Clinical Impression  1. Pharyngitis, unspecified etiology   2. Viral URI with cough      ED Assessment/Plan  Strep PCR negative.  No evidence of epiglottitis, retropharyngeal abscess, peritonsillar abscess.  Patient appears uncomfortable, giving viscous lidocaine x1 here.  States allergies and GERD are not bothering her at this time.  She does not have otitis externa.  Advised her to stop the Cipro and dexamethasone eardrops.  [facility] narcotic database reviewed. No narcotic prescriptions since March. Advised patient to not take benzodiazepine with the cough syrup.  Strep, flu, Covid negative. Presentation most consistent with a viral respiratory infection/sore throat.  Patient home with ibuprofen/ Tylenol, Benadryl/Maalox mixture. Discontinue Flonase, start Nasonex and Astelin because she states Flonase has not been working for her and Astelin because a history of allergic rhinitis, saline nasal irrigation, Mucinex. Tussionex for the cough. Patient states that she is unable to sleep because of the sore throat and cough. Advised her to stop the amoxicillin as not sure exactly what we would be treating with it. Patient to followup with PMD as needed, to the ER if she gets worse.  Discussed labs,  MDM, plan and followup with patient. Discussed sn/sx that should prompt return to the ED. patient agrees  with plan.   Meds ordered this encounter  Medications  . lidocaine (XYLOCAINE) 2 % viscous mouth solution 15 mL  . mometasone (NASONEX) 50 MCG/ACT nasal spray    Sig: Place 2 sprays into the nose daily.    Dispense:  17 g    Refill:  0  . chlorpheniramine-HYDROcodone (TUSSIONEX PENNKINETIC ER) 10-8 MG/5ML SUER    Sig: Take 5 mLs by mouth every 12 (twelve) hours as needed for cough.    Dispense:  60 mL    Refill:  0  . ibuprofen (ADVIL) 600 MG tablet    Sig: Take 1 tablet (600 mg total) by mouth every 6 (six) hours as needed.    Dispense:  30 tablet    Refill:  0  . azelastine (ASTELIN) 0.1 % nasal spray    Sig: Place 2 sprays into both nostrils 2 (two) times daily.    Dispense:  30 mL    Refill:  0     *This clinic note was created using [employer]. Therefore, there may be occasional mistakes despite careful proofreading.     Melynda Ripple, MD 09/06/20 [phone]

## 2020-09-16 ENCOUNTER — Other Ambulatory Visit: Payer: Self-pay | Admitting: Nurse Practitioner

## 2020-09-16 ENCOUNTER — Other Ambulatory Visit: Payer: Self-pay | Admitting: Hospice and Palliative Medicine

## 2020-09-16 ENCOUNTER — Encounter: Payer: Self-pay | Admitting: Hospice and Palliative Medicine

## 2020-09-16 ENCOUNTER — Ambulatory Visit (INDEPENDENT_AMBULATORY_CARE_PROVIDER_SITE_OTHER): Payer: Medicare HMO | Admitting: Hospice and Palliative Medicine

## 2020-09-16 VITALS — BP 122/84 | HR 75 | Temp 97.1°F | Resp 16 | Ht 63.0 in | Wt 180.6 lb

## 2020-09-16 DIAGNOSIS — E782 Mixed hyperlipidemia: Secondary | ICD-10-CM | POA: Diagnosis not present

## 2020-09-16 DIAGNOSIS — R300 Dysuria: Secondary | ICD-10-CM

## 2020-09-16 DIAGNOSIS — E559 Vitamin D deficiency, unspecified: Secondary | ICD-10-CM | POA: Diagnosis not present

## 2020-09-16 DIAGNOSIS — Z0001 Encounter for general adult medical examination with abnormal findings: Secondary | ICD-10-CM | POA: Diagnosis not present

## 2020-09-16 DIAGNOSIS — I10 Essential (primary) hypertension: Secondary | ICD-10-CM | POA: Diagnosis not present

## 2020-09-16 DIAGNOSIS — N3000 Acute cystitis without hematuria: Secondary | ICD-10-CM | POA: Diagnosis not present

## 2020-09-16 LAB — POCT URINALYSIS DIPSTICK
Bilirubin, UA: NEGATIVE
Blood, UA: NEGATIVE
Glucose, UA: NEGATIVE
Ketones, UA: 0.2
Nitrite, UA: NEGATIVE
Protein, UA: POSITIVE — AB
Spec Grav, UA: >=1.03 — AB (ref 1.010–1.025)
Urobilinogen, UA: 0.2 E.U./dL
pH, UA: 5 (ref 5.0–8.0)

## 2020-09-16 MED ORDER — PHENAZOPYRIDINE HCL 100 MG PO TABS
100.0000 mg | ORAL_TABLET | Freq: Three times a day (TID) | ORAL | 0 refills | Status: DC | PRN
Start: 2020-09-16 — End: 2021-04-21

## 2020-09-16 MED ORDER — PHENAZOPYRIDINE HCL 100 MG PO TABS: 100.0000 mg | ORAL_TABLET | Freq: Three times a day (TID) | ORAL | 0 refills | Status: DC | PRN

## 2020-09-16 MED ORDER — NITROFURANTOIN MONOHYD MACRO 100 MG PO CAPS: 100.0000 mg | ORAL_CAPSULE | Freq: Two times a day (BID) | ORAL | 0 refills | Status: DC

## 2020-09-16 NOTE — Addendum Note (Signed)
Addended by: Jeannetta Ellis on: 09/16/2020 10:07 AM   Modules accepted: Orders

## 2020-09-16 NOTE — Addendum Note (Signed)
Addended by: Jeannetta Ellis on: 09/16/2020 10:05 AM   Modules accepted: Orders

## 2020-09-16 NOTE — Progress Notes (Signed)
[facility] [address] [place], [place] [address]  Internal MEDICINE  Office Visit Note  Patient Name: Paula [name]  [id number]  [id number]  Date of Service: 09/16/2020  Chief Complaint  Patient presents with  . Acute Visit    Started last Friday, difficulty urinating, urgency, pain, odor, cloudy   . Urinary Tract Infection  . Gastroesophageal Reflux  . Hypertension  . Asthma     HPI Pt is here for a sick visit. She complains that she has been feeling bad since Christmas holiday, treated for possible sinusitis She explains her throat was very sore and swollen, difficulty swallowing, drooling Seen by urgent care over Christmas holiday--negative for flu, strep and COVID, treated her with nasal steroid, mouth wash and ibuprofen She also started taking Mucinex over the counter She then developed a productive cough over the holiday as well She spent several days in bed, loss of appetite These symptoms have since resolved Treated with Augmentin for these symptoms  New Year's eve, she noticed an odor in her urine Over the day Friday, she started noticing burning with urination--burning is when she feels her bladder is getting full and during urination Has not been febrile with urinary symptoms, mild low back pain but this is a chronic finding    Current Medication:  Outpatient Encounter Medications as of 09/16/2020  Medication Sig  . nitrofurantoin, macrocrystal-monohydrate, (MACROBID) 100 MG capsule Take 1 capsule (100 mg total) by mouth 2 (two) times daily.  . phenazopyridine (PYRIDIUM) 100 MG tablet Take 1 tablet (100 mg total) by mouth 3 (three) times daily as needed for pain.  Marland Kitchen azelastine (ASTELIN) 0.1 % nasal spray Place 2 sprays into both nostrils 2 (two) times daily.  . chlorpheniramine-HYDROcodone (TUSSIONEX PENNKINETIC ER) 10-8 MG/5ML SUER Take 5 mLs by mouth every 12 (twelve) hours as needed for cough.  Marland Kitchen ibuprofen (ADVIL) 600 MG tablet Take 1 tablet  (600 mg total) by mouth every 6 (six) hours as needed.  . mometasone (NASONEX) 50 MCG/ACT nasal spray Place 2 sprays into the nose daily.  Marland Kitchen spironolactone (ALDACTONE) 25 MG tablet Take 1 tablet (25 mg total) by mouth 2 (two) times daily.  . [DISCONTINUED] fluticasone (FLONASE) 50 MCG/ACT nasal spray Place 2 sprays into both nostrils daily.   No facility-administered encounter medications on file as of 09/16/2020.      Medical History: Past Medical History:  Diagnosis Date  . Asthma   . Environmental allergies   . GERD (gastroesophageal reflux disease)   . Hypertension   . Migraine   . Sinusitis      Vital Signs: BP 122/84   Pulse 75   Temp (!) 97.1 F (36.2 C)   Resp 16   Ht 5\' 3"  (1.6 m)   Wt 180 lb 9.6 oz (81.9 kg)   SpO2 98%   BMI 31.99 kg/m    Review of Systems  Constitutional: Negative for chills, diaphoresis and fatigue.  HENT: Negative for ear pain, postnasal drip and sinus pressure.   Eyes: Negative for photophobia, discharge, redness, itching and visual disturbance.  Respiratory: Negative for cough, shortness of breath and wheezing.   Cardiovascular: Negative for chest pain, palpitations and leg swelling.  Gastrointestinal: Negative for abdominal pain, constipation, diarrhea, nausea and vomiting.  Genitourinary: Positive for dysuria and frequency. Negative for flank pain.       Strong odor in urine  Musculoskeletal: Negative for arthralgias, back pain, gait problem and neck pain.  Skin: Negative for color change.  Allergic/Immunologic:  Negative for environmental allergies and food allergies.  Neurological: Negative for dizziness and headaches.  Hematological: Does not bruise/bleed easily.  Psychiatric/Behavioral: Negative for agitation, behavioral problems (depression) and hallucinations.    Physical Exam Vitals reviewed.  Constitutional:      Appearance: Normal appearance. She is normal weight.  Cardiovascular:     Rate and Rhythm: Normal rate and  regular rhythm.     Pulses: Normal pulses.     Heart sounds: Normal heart sounds.  Pulmonary:     Effort: Pulmonary effort is normal.     Breath sounds: Normal breath sounds.  Abdominal:     General: Abdomen is flat.     Palpations: Abdomen is soft.     Tenderness: There is abdominal tenderness.     Comments: Tenderness to pelvic area  Musculoskeletal:        General: Normal range of motion.  Skin:    General: Skin is warm.  Neurological:     General: No focal deficit present.     Mental Status: She is alert and oriented to person, place, and time. Mental status is at baseline.  Psychiatric:        Mood and Affect: Mood normal.        Behavior: Behavior normal.        Thought Content: Thought content normal.        Judgment: Judgment normal.    Assessment/Plan: 1. Acute cystitis without hematuria Start Macrobid, will await culture results and adjust antibiotic therapy as indicated - nitrofurantoin, macrocrystal-monohydrate, (MACROBID) 100 MG capsule; Take 1 capsule (100 mg total) by mouth 2 (two) times daily.  Dispense: 10 capsule; Refill: 0  2. Dysuria May use Pyridium as needed for bladder pain and discomfort, advised urine will turn orange and not to be alarmed by these findings - phenazopyridine (PYRIDIUM) 100 MG tablet; Take 1 tablet (100 mg total) by mouth 3 (three) times daily as needed for pain.  Dispense: 12 tablet; Refill: 0  General Counseling: Paula [name] verbalizes understanding of the findings of todays visit and agrees with plan of treatment. I have discussed any further diagnostic evaluation that may be needed or ordered today. We also reviewed her medications today. she has been encouraged to call the office with any questions or concerns that should arise related to todays visit.     Meds ordered this encounter  Medications  . phenazopyridine (PYRIDIUM) 100 MG tablet    Sig: Take 1 tablet (100 mg total) by mouth 3 (three) times daily as needed for pain.     Dispense:  12 tablet    Refill:  0  . nitrofurantoin, macrocrystal-monohydrate, (MACROBID) 100 MG capsule    Sig: Take 1 capsule (100 mg total) by mouth 2 (two) times daily.    Dispense:  10 capsule    Refill:  0    Time spent: 30 Minutes  This patient was seen by Theodoro Grist AGNP-C in Collaboration with Dr Lavera Guise as a part of collaborative care agreement.  Tanna Furry [facility]

## 2020-09-17 LAB — CBC
Hematocrit: 37.7 % (ref 34.0–46.6)
Hemoglobin: 12.3 g/dL (ref 11.1–15.9)
MCH: 30.3 pg (ref 26.6–33.0)
MCHC: 32.6 g/dL (ref 31.5–35.7)
MCV: 93 fL (ref 79–97)
Platelets: 289 10*3/uL (ref 150–450)
RBC: 4.06 x10E6/uL (ref 3.77–5.28)
RDW: 12.4 % (ref 11.7–15.4)
WBC: 6.6 10*3/uL (ref 3.4–10.8)

## 2020-09-17 LAB — COMPREHENSIVE METABOLIC PANEL
ALT: 13 IU/L (ref 0–32)
AST: 16 IU/L (ref 0–40)
Albumin/Globulin Ratio: 2 (ref 1.2–2.2)
Albumin: 4.5 g/dL (ref 3.8–4.8)
Alkaline Phosphatase: 52 IU/L (ref 44–121)
BUN/Creatinine Ratio: 21 (ref 12–28)
BUN: 20 mg/dL (ref 8–27)
Bilirubin Total: <0.2 mg/dL (ref 0.0–1.2)
CO2: 23 mmol/L (ref 20–29)
Calcium: 9.7 mg/dL (ref 8.7–10.3)
Chloride: 105 mmol/L (ref 96–106)
Creatinine, Ser: 0.96 mg/dL (ref 0.57–1.00)
GFR calc Af Amer: 71 mL/min/{1.73_m2} (ref >59)
GFR calc non Af Amer: 62 mL/min/{1.73_m2} (ref >59)
Globulin, Total: 2.3 g/dL (ref 1.5–4.5)
Glucose: 95 mg/dL (ref 65–99)
Potassium: 4.5 mmol/L (ref 3.5–5.2)
Sodium: 142 mmol/L (ref 134–144)
Total Protein: 6.8 g/dL (ref 6.0–8.5)

## 2020-09-17 LAB — LIPID PANEL WITH LDL/HDL RATIO
Cholesterol, Total: 161 mg/dL (ref 100–199)
HDL: 61 mg/dL (ref >39)
LDL Chol Calc (NIH): 85 mg/dL (ref 0–99)
LDL/HDL Ratio: 1.4 ratio (ref 0.0–3.2)
Triglycerides: 76 mg/dL (ref 0–149)
VLDL Cholesterol Cal: 15 mg/dL (ref 5–40)

## 2020-09-17 LAB — HCV AB W REFLEX TO QUANT PCR: HCV Ab: <0.1 s/co ratio (ref 0.0–0.9)

## 2020-09-17 LAB — VITAMIN D 25 HYDROXY (VIT D DEFICIENCY, FRACTURES): Vit D, 25-Hydroxy: 53.1 ng/mL (ref 30.0–100.0)

## 2020-09-17 LAB — T4, FREE: Free T4: 1.35 ng/dL (ref 0.82–1.77)

## 2020-09-17 LAB — TSH: TSH: 2.29 u[IU]/mL (ref 0.450–4.500)

## 2020-09-17 LAB — HCV INTERPRETATION

## 2020-09-19 LAB — CULTURE, URINE COMPREHENSIVE

## 2020-09-20 NOTE — Progress Notes (Signed)
Treated with Macrobid at time of visit.

## 2020-09-30 ENCOUNTER — Encounter: Payer: Medicare HMO | Admitting: Internal Medicine

## 2020-10-07 ENCOUNTER — Ambulatory Visit (INDEPENDENT_AMBULATORY_CARE_PROVIDER_SITE_OTHER): Payer: Medicare HMO | Admitting: Physician Assistant

## 2020-10-07 ENCOUNTER — Other Ambulatory Visit: Payer: Self-pay

## 2020-10-07 DIAGNOSIS — K219 Gastro-esophageal reflux disease without esophagitis: Secondary | ICD-10-CM

## 2020-10-07 DIAGNOSIS — F411 Generalized anxiety disorder: Secondary | ICD-10-CM

## 2020-10-07 DIAGNOSIS — R059 Cough, unspecified: Secondary | ICD-10-CM | POA: Diagnosis not present

## 2020-10-07 DIAGNOSIS — R300 Dysuria: Secondary | ICD-10-CM | POA: Diagnosis not present

## 2020-10-07 DIAGNOSIS — I10 Essential (primary) hypertension: Secondary | ICD-10-CM | POA: Diagnosis not present

## 2020-10-07 DIAGNOSIS — Z0001 Encounter for general adult medical examination with abnormal findings: Secondary | ICD-10-CM | POA: Diagnosis not present

## 2020-10-07 MED ORDER — LORAZEPAM 0.5 MG PO TABS: 0.5000 mg | ORAL_TABLET | Freq: Two times a day (BID) | ORAL | 1 refills | Status: DC | PRN

## 2020-10-07 MED ORDER — PANTOPRAZOLE SODIUM 40 MG PO TBEC: 40.0000 mg | DELAYED_RELEASE_TABLET | Freq: Every day | ORAL | 3 refills | Status: DC

## 2020-10-07 NOTE — Progress Notes (Signed)
[facility] [place], [place] [address]  Internal MEDICINE  Office Visit Note  Patient Name: Paula [name]  [id number]  [id number]  Date of Service: 10/12/2020  Chief Complaint  Patient presents with  . Annual Exam  . Gastroesophageal Reflux  . Hypertension     HPI Pt is here for routine health maintenance examination, overall she feels well, recent UTI  UTI sx have improved since last visit. She did have culture positive with E-coli senstive to [place], has completed course of 10 days, no fever or chills  Pt is updated as follows for Preventive Health Maintenance (PHM) Mammogram: Up to date 2021 Colonoscopy: pt said it was less than 10 yrs ago.  BMD: up to date 2021  Takes Lorazepam at night to sleep when mind won't stop racing. Only takes it occasionally, last script was from May 2021. Also takes melatonin as needed.  Current Medication: Outpatient Encounter Medications as of 10/07/2020  Medication Sig  . LORazepam (ATIVAN) 0.5 MG tablet Take 1 tablet (0.5 mg total) by mouth 2 (two) times daily as needed for anxiety (Only take 1 tab as needed for anxiety). (Patient taking differently: Take 0.5 mg by mouth daily as needed for anxiety (Only take 1 tab as needed for anxiety).)  . pantoprazole (PROTONIX) 40 MG tablet Take 1 tablet (40 mg total) by mouth daily.  Marland Kitchen azelastine (ASTELIN) 0.1 % nasal spray Place 2 sprays into both nostrils 2 (two) times daily.  . chlorpheniramine-HYDROcodone (TUSSIONEX PENNKINETIC ER) 10-8 MG/5ML SUER Take 5 mLs by mouth every 12 (twelve) hours as needed for cough.  Marland Kitchen ibuprofen (ADVIL) 600 MG tablet Take 1 tablet (600 mg total) by mouth every 6 (six) hours as needed.  . mometasone (NASONEX) 50 MCG/ACT nasal spray Place 2 sprays into the nose daily.  . nitrofurantoin, macrocrystal-monohydrate, (MACROBID) 100 MG capsule Take 1 capsule (100 mg total) by mouth 2 (two) times daily.  . phenazopyridine (PYRIDIUM) 100 MG tablet Take 1  tablet (100 mg total) by mouth 3 (three) times daily as needed for pain.  Marland Kitchen spironolactone (ALDACTONE) 25 MG tablet Take 1 tablet (25 mg total) by mouth 2 (two) times daily.  . [DISCONTINUED] fluticasone (FLONASE) 50 MCG/ACT nasal spray Place 2 sprays into both nostrils daily.   No facility-administered encounter medications on file as of 10/07/2020.    Surgical History: Past Surgical History:  Procedure Laterality Date  . AUGMENTATION MAMMAPLASTY Bilateral 1987  . [place]  . skin cancer   08/11/2016   removal  . TUBAL LIGATION  08/11/2016    Medical History: Past Medical History:  Diagnosis Date  . Asthma   . Environmental allergies   . GERD (gastroesophageal reflux disease)   . Hypertension   . Migraine   . Sinusitis    Social History   Socioeconomic History  . Marital status: Married    Spouse name: Not on file  . Number of children: Not on file  . Years of education: Not on file  . Highest education level: Not on file  Occupational History  . Not on file  Tobacco Use  . Smoking status: Former Smoker    Quit date: 12/01/2004    Years since quitting: 15.8  . Smokeless tobacco: Never Used  Vaping Use  . Vaping Use: Never used  Substance and Sexual Activity  . Alcohol use: Yes    Comment: ocassionally  . Drug use: No  . Sexual activity: Not on file  Other Topics  Concern  . Not on file  Social History Narrative  . Not on file   Social Determinants of Health   Financial Resource Strain: Not on file  Food Insecurity: Not on file  Transportation Needs: Not on file  Physical Activity: Not on file  Stress: Not on file  Social Connections: Not on file  Intimate Partner Violence: Not on file   Family History: Family History  Problem Relation Age of Onset  . Breast cancer Cousin   . COPD Mother   . Lung cancer Mother   . Hypertension Mother   . Ulcers Father   . Bowel Disease Father        blockage, sepsis      Review of Systems   Constitutional: Negative for chills, diaphoresis, fatigue and fever.  HENT: Negative for congestion, ear pain, postnasal drip and sinus pressure.   Eyes: Negative for photophobia, discharge, redness, itching and visual disturbance.  Respiratory: Negative for cough, shortness of breath and wheezing.   Cardiovascular: Negative for chest pain, palpitations and leg swelling.  Gastrointestinal: Negative for abdominal pain, constipation, diarrhea, nausea and vomiting.  Genitourinary: Negative for dysuria and flank pain.  Musculoskeletal: Negative for arthralgias, back pain, gait problem and neck pain.  Skin: Negative.  Negative for color change.  Allergic/Immunologic: Negative for environmental allergies and food allergies.  Neurological: Negative for dizziness and headaches.  Hematological: Does not bruise/bleed easily.  Psychiatric/Behavioral: Negative for agitation, behavioral problems (depression) and hallucinations. The patient is nervous/anxious.      Vital Signs: BP 139/74   Pulse 76   Temp 97.6 F (36.4 C)   Resp 16   Ht 5' 3"  (1.6 m)   Wt 183 lb 12.8 oz (83.4 kg)   SpO2 97%   BMI 32.56 kg/m    Physical Exam Constitutional:      Appearance: Normal appearance. She is obese.  HENT:     Head: Normocephalic and atraumatic.     Right Ear: Tympanic membrane normal.     Left Ear: Tympanic membrane normal.     Nose: Nose normal.     Mouth/Throat:     Mouth: Mucous membranes are moist.     Pharynx: Oropharynx is clear.  Eyes:     Extraocular Movements: Extraocular movements intact.     Pupils: Pupils are equal, round, and reactive to light.  Cardiovascular:     Rate and Rhythm: Normal rate and regular rhythm.     Pulses: Normal pulses.     Heart sounds: Normal heart sounds.  Pulmonary:     Effort: Pulmonary effort is normal.     Breath sounds: Normal breath sounds.  Chest:     Chest wall: No mass, swelling or tenderness.  Breasts:     Right: Normal. No swelling, mass  or tenderness.     Left: Normal. No swelling, mass or tenderness.    Abdominal:     General: Abdomen is flat. Bowel sounds are normal.     Palpations: Abdomen is soft.  Musculoskeletal:        General: Normal range of motion.     Cervical back: Normal range of motion.  Skin:    General: Skin is warm and dry.  Neurological:     General: No focal deficit present.     Mental Status: She is alert.  Psychiatric:        Behavior: Behavior normal.      LABS: Recent Results (from the past 2160 hour(s))  Group A Strep by  PCR     Status: None   Collection Time: 09/05/20  8:10 PM   Specimen: Throat; Sterile Swab  Result Value Ref Range   Group A Strep by PCR NOT DETECTED NOT DETECTED    Comment: Performed at [facility], [address], [place], [place] [address]  Resp Panel by RT-PCR (Flu A&B, Covid) Throat     Status: None   Collection Time: 09/05/20  8:10 PM   Specimen: Throat; Nasopharyngeal(NP) swabs in vial transport medium  Result Value Ref Range   SARS Coronavirus 2 by RT PCR NEGATIVE NEGATIVE    Comment: (NOTE) SARS-CoV-2 target nucleic acids are NOT DETECTED.  The SARS-CoV-2 RNA is generally detectable in upper respiratory specimens during the acute phase of infection. The lowest concentration of SARS-CoV-2 viral copies this assay can detect is 138 copies/mL. A negative result does not preclude SARS-Cov-2 infection and should not be used as the sole basis for treatment or other patient management decisions. A negative result may occur with  improper specimen collection/handling, submission of specimen other than nasopharyngeal swab, presence of viral mutation(s) within the areas targeted by this assay, and inadequate number of viral copies(<138 copies/mL). A negative result must be combined with clinical observations, patient history, and epidemiological information. The expected result is Negative.  Fact Sheet for Patients:   EntrepreneurPulse.com.au  Fact Sheet for Healthcare Providers:  IncredibleEmployment.be  This test is no t yet approved or cleared by the [place] FDA and  has been authorized for detection and/or diagnosis of SARS-CoV-2 by FDA under an Emergency Use Authorization (EUA). This EUA will remain  in effect (meaning this test can be used) for the duration of the COVID-19 declaration under Section 564(b)(1) of the Act, 21 U.S.C.section 360bbb-3(b)(1), unless the authorization is terminated  or revoked sooner.       Influenza A by PCR NEGATIVE NEGATIVE   Influenza B by PCR NEGATIVE NEGATIVE    Comment: (NOTE) The Xpert Xpress SARS-CoV-2/FLU/RSV plus assay is intended as an aid in the diagnosis of influenza from Nasopharyngeal swab specimens and should not be used as a sole basis for treatment. Nasal washings and aspirates are unacceptable for Xpert Xpress SARS-CoV-2/FLU/RSV testing.  Fact Sheet for Patients: EntrepreneurPulse.com.au  Fact Sheet for Healthcare Providers: IncredibleEmployment.be  This test is not yet approved or cleared by the [place] FDA and has been authorized for detection and/or diagnosis of SARS-CoV-2 by FDA under an Emergency Use Authorization (EUA). This EUA will remain in effect (meaning this test can be used) for the duration of the COVID-19 declaration under Section 564(b)(1) of the Act, 21 U.S.C. section 360bbb-3(b)(1), unless the authorization is terminated or revoked.  Performed at [facility], [address] [place], [place] [address]   Comprehensive metabolic panel     Status: None   Collection Time: 09/16/20  9:15 AM  Result Value Ref Range   Glucose 95 65 - 99 mg/dL   BUN 20 8 - 27 mg/dL   Creatinine, Ser 0.96 0.57 - 1.00 mg/dL   GFR calc non Af Amer 62 >59 mL/min/1.73   GFR calc Af Amer 71 >59 mL/min/1.73    Comment: **In accordance with  recommendations from the NKF-ASN Task force,**   [facility] is in the process of updating its eGFR calculation to the   2021 CKD-EPI creatinine equation that estimates kidney function   without a race variable.    BUN/Creatinine Ratio 21 12 - 28   Sodium 142 134 -  144 mmol/L   Potassium 4.5 3.5 - 5.2 mmol/L   Chloride 105 96 - 106 mmol/L   CO2 23 20 - 29 mmol/L   Calcium 9.7 8.7 - 10.3 mg/dL   Total Protein 6.8 6.0 - 8.5 g/dL   Albumin 4.5 3.8 - 4.8 g/dL   Globulin, Total 2.3 1.5 - 4.5 g/dL   Albumin/Globulin Ratio 2.0 1.2 - 2.2   Bilirubin Total <0.2 0.0 - 1.2 mg/dL   Alkaline Phosphatase 52 44 - 121 IU/L    Comment:               **Please note reference interval change**   AST 16 0 - 40 IU/L   ALT 13 0 - 32 IU/L  CBC     Status: None   Collection Time: 09/16/20  9:15 AM  Result Value Ref Range   WBC 6.6 3.4 - 10.8 x10E3/uL   RBC 4.06 3.77 - 5.28 x10E6/uL   Hemoglobin 12.3 11.1 - 15.9 g/dL   Hematocrit 37.7 34.0 - 46.6 %   MCV 93 79 - 97 fL   MCH 30.3 26.6 - 33.0 pg   MCHC 32.6 31.5 - 35.7 g/dL   RDW 12.4 11.7 - 15.4 %   Platelets 289 150 - 450 x10E3/uL  Lipid Panel With LDL/HDL Ratio     Status: None   Collection Time: 09/16/20  9:15 AM  Result Value Ref Range   Cholesterol, Total 161 100 - 199 mg/dL   Triglycerides 76 0 - 149 mg/dL   HDL 61 >39 mg/dL   VLDL Cholesterol Cal 15 5 - 40 mg/dL   LDL Chol Calc (NIH) 85 0 - 99 mg/dL   LDL/HDL Ratio 1.4 0.0 - 3.2 ratio    Comment:                                     LDL/HDL Ratio                                             Men  Women                               1/2 Avg.Risk  1.0    1.5                                   Avg.Risk  3.6    3.2                                2X Avg.Risk  6.2    5.0                                3X Avg.Risk  8.0    6.1   HCV Ab w Reflex to Quant PCR     Status: None   Collection Time: 09/16/20  9:15 AM  Result Value Ref Range   HCV Ab <0.1 0.0 - 0.9 s/co ratio  Interpretation:     Status:  None   Collection Time: 09/16/20  9:15 AM  Result Value  Ref Range   HCV Interp 1: Comment     Comment: Negative Not infected with HCV, unless recent infection is suspected or other evidence exists to indicate HCV infection.   T4, free     Status: None   Collection Time: 09/16/20  9:15 AM  Result Value Ref Range   Free T4 1.35 0.82 - 1.77 ng/dL  TSH     Status: None   Collection Time: 09/16/20  9:15 AM  Result Value Ref Range   TSH 2.290 0.450 - 4.500 uIU/mL  VITAMIN D 25 Hydroxy (Vit-D Deficiency, Fractures)     Status: None   Collection Time: 09/16/20  9:15 AM  Result Value Ref Range   Vit D, 25-Hydroxy 53.1 30.0 - 100.0 ng/mL    Comment: Vitamin D deficiency has been defined by the Institute of Medicine and an Endocrine Society practice guideline as a level of serum 25-OH vitamin D less than 20 ng/mL (1,2). The Endocrine Society went on to further define vitamin D insufficiency as a level between 21 and 29 ng/mL (2). 1. IOM (Institute of Medicine). 2010. Dietary reference    intakes for calcium and D. [place]: The    [employer]. 2. Holick MF, Binkley [place], Bischoff-Ferrari HA, et al.    Evaluation, treatment, and prevention of vitamin D    deficiency: an Endocrine Society clinical practice    guideline. JCEM. 2011 Jul; 96(7):1911-30.   CULTURE, URINE COMPREHENSIVE     Status: Abnormal   Collection Time: 09/16/20  9:30 AM   Specimen: Urine   Urine  Result Value Ref Range   Urine Culture, Comprehensive Final report (A)    Organism ID, Bacteria Escherichia coli (A)     Comment: 50,000-100,000 colony forming units per mL Cefazolin with an MIC <=16 predicts susceptibility to the oral agents cefaclor, cefdinir, cefpodoxime, cefprozil, cefuroxime, cephalexin, and loracarbef when used for therapy of uncomplicated urinary tract infections due to E. coli, Klebsiella pneumoniae, and Proteus mirabilis.    ANTIMICROBIAL SUSCEPTIBILITY Comment     Comment:        ** S = Susceptible; I = Intermediate; R = Resistant **                    P = Positive; N = Negative             MICS are expressed in micrograms per mL    Antibiotic                 RSLT#1    RSLT#2    RSLT#3    RSLT#4 Amoxicillin/Clavulanic Acid    R Ampicillin                     R Cefazolin                      S Cefepime                       S Ceftriaxone                    S Cefuroxime                     S Ciprofloxacin                  S Ertapenem  S Gentamicin                     S Imipenem                       S Levofloxacin                   S Meropenem                      S Nitrofurantoin                 S Piperacillin/Tazobactam        I Tetracycline                   S Tobramycin                     S Trimethoprim/Sulfa             S   POCT Urinalysis Dipstick     Status: Abnormal   Collection Time: 09/16/20 10:01 AM  Result Value Ref Range   Color, UA     Clarity, UA     Glucose, UA Negative Negative   Bilirubin, UA negative    Ketones, UA 0.2    Spec Grav, UA >=1.030 (A) 1.010 - 1.025   Blood, UA negative    pH, UA 5.0 5.0 - 8.0   Protein, UA Positive (A) Negative   Urobilinogen, UA 0.2 0.2 or 1.0 E.U./dL   Nitrite, UA negative    Leukocytes, UA Moderate (2+) (A) Negative   Appearance     Odor    UA/M w/rflx Culture, Routine     Status: None   Collection Time: 10/07/20 10:29 AM   Specimen: Urine   Urine  Result Value Ref Range   Specific Gravity, UA 1.005 1.005 - 1.030   pH, UA 7.0 5.0 - 7.5   Color, UA Yellow Yellow   Appearance Ur Clear Clear   Leukocytes,UA Negative Negative   Protein,UA Negative Negative/Trace   Glucose, UA Negative Negative   Ketones, UA Negative Negative   RBC, UA Negative Negative   Bilirubin, UA Negative Negative   Urobilinogen, Ur 0.2 0.2 - 1.0 mg/dL   Nitrite, UA Negative Negative   Microscopic Examination Comment     Comment: Microscopic follows if indicated.   Microscopic Examination See  below:     Comment: Microscopic was indicated and was performed.   Urinalysis Reflex Comment     Comment: This specimen will not reflex to a Urine Culture.  Microscopic Examination     Status: None   Collection Time: 10/07/20 10:29 AM   Urine  Result Value Ref Range   WBC, UA None seen 0 - 5 /hpf   RBC None seen 0 - 2 /hpf   Epithelial Cells (non renal) None seen 0 - 10 /hpf   Casts None seen None seen /lpf   Bacteria, UA None seen None seen/Few        Assessment/Plan: 1. Encounter for general adult medical examination with abnormal findings Reviewed labs with patient. - DG Chest 2 View; Future  2. Generalized anxiety disorder Only occasional anxiety, usually at night when going to sleep. Only takes Ativan as needed. - LORazepam (ATIVAN) 0.5 MG tablet; Take 1 tablet (0.5 mg total) by mouth 2 (two) times daily as needed for anxiety (Only take 1 tab as needed for anxiety). (Patient taking  differently: Take 0.5 mg by mouth daily as needed for anxiety (Only take 1 tab as needed for anxiety).)  Dispense: 30 tablet; Refill: 1  3. Gastro-esophageal reflux disease without esophagitis Stable. Continue current medication as needed. - pantoprazole (PROTONIX) 40 MG tablet; Take 1 tablet (40 mg total) by mouth daily.  Dispense: 30 tablet; Refill: 3  4. Cough She is a former smoker and will get a chest xray for preventive screening. - DG Chest 2 View; Future  5. Essential Hypertension Stable. Will continue current medications.  6. Dysuria - UA/M w/rflx Culture, Routine  General Counseling: Thressa verbalizes understanding of the findings of todays visit and agrees with plan of treatment. I have discussed any further diagnostic evaluation that may be needed or ordered today. We also reviewed her medications today. she has been encouraged to call the office with any questions or concerns that should arise related to todays visit.    Counseling: Hypertension Counseling:   The following  hypertensive lifestyle modification were recommended and discussed:  1. Limiting alcohol intake to less than 1 oz/day of ethanol:(24 oz of beer or 8 oz of wine or 2 oz of 100-proof whiskey). 2. Take baby ASA 81 mg daily. 3. Importance of regular aerobic exercise and losing weight. 4. Reduce dietary saturated fat and cholesterol intake for overall cardiovascular health. 5. Maintaining adequate dietary potassium, calcium, and magnesium intake. 6. Regular monitoring of the blood pressure. 7. Reduce sodium intake to less than 100 mmol/day (less than 2.3 gm of sodium or less than 6 gm of sodium choride)    Orders Placed This Encounter  Procedures  . Microscopic Examination  . DG Chest 2 View  . UA/M w/rflx Culture, Routine    Meds ordered this encounter  Medications  . pantoprazole (PROTONIX) 40 MG tablet    Sig: Take 1 tablet (40 mg total) by mouth daily.    Dispense:  30 tablet    Refill:  3  . LORazepam (ATIVAN) 0.5 MG tablet    Sig: Take 1 tablet (0.5 mg total) by mouth 2 (two) times daily as needed for anxiety (Only take 1 tab as needed for anxiety).    Dispense:  30 tablet    Refill:  1    Total time spent:30 Minutes  Time spent includes review of chart, medications, test results, and follow up plan with the patient.     Lavera Guise, MD  Internal Medicine

## 2020-10-08 DIAGNOSIS — L570 Actinic keratosis: Secondary | ICD-10-CM | POA: Diagnosis not present

## 2020-10-08 DIAGNOSIS — L820 Inflamed seborrheic keratosis: Secondary | ICD-10-CM | POA: Diagnosis not present

## 2020-10-08 DIAGNOSIS — X32XXXA Exposure to sunlight, initial encounter: Secondary | ICD-10-CM | POA: Diagnosis not present

## 2020-10-08 DIAGNOSIS — D2271 Melanocytic nevi of right lower limb, including hip: Secondary | ICD-10-CM | POA: Diagnosis not present

## 2020-10-08 DIAGNOSIS — Z85828 Personal history of other malignant neoplasm of skin: Secondary | ICD-10-CM | POA: Diagnosis not present

## 2020-10-08 DIAGNOSIS — D2262 Melanocytic nevi of left upper limb, including shoulder: Secondary | ICD-10-CM | POA: Diagnosis not present

## 2020-10-08 DIAGNOSIS — L538 Other specified erythematous conditions: Secondary | ICD-10-CM | POA: Diagnosis not present

## 2020-10-08 DIAGNOSIS — D225 Melanocytic nevi of trunk: Secondary | ICD-10-CM | POA: Diagnosis not present

## 2020-10-08 DIAGNOSIS — D2261 Melanocytic nevi of right upper limb, including shoulder: Secondary | ICD-10-CM | POA: Diagnosis not present

## 2020-10-08 LAB — UA/M W/RFLX CULTURE, ROUTINE
Bilirubin, UA: NEGATIVE
Glucose, UA: NEGATIVE
Ketones, UA: NEGATIVE
Leukocytes,UA: NEGATIVE
Nitrite, UA: NEGATIVE
Protein,UA: NEGATIVE
RBC, UA: NEGATIVE
Specific Gravity, UA: 1.005 (ref 1.005–1.030)
Urobilinogen, Ur: 0.2 mg/dL (ref 0.2–1.0)
pH, UA: 7 (ref 5.0–7.5)

## 2020-10-08 LAB — MICROSCOPIC EXAMINATION
Bacteria, UA: NONE SEEN
Casts: NONE SEEN /lpf
Epithelial Cells (non renal): NONE SEEN /hpf (ref 0–10)
RBC, Urine: NONE SEEN /hpf (ref 0–2)
WBC, UA: NONE SEEN /hpf (ref 0–5)

## 2020-10-13 ENCOUNTER — Encounter: Payer: Self-pay | Admitting: Physician Assistant

## 2020-10-16 ENCOUNTER — Ambulatory Visit
Admission: RE | Admit: 2020-10-16 | Discharge: 2020-10-16 | Disposition: A | Discharge status: Home / Self Care | Payer: Medicare HMO | Source: Ambulatory Visit | Attending: Physician Assistant | Admitting: Physician Assistant

## 2020-10-16 ENCOUNTER — Other Ambulatory Visit: Payer: Self-pay

## 2020-10-16 DIAGNOSIS — I10 Essential (primary) hypertension: Secondary | ICD-10-CM | POA: Diagnosis not present

## 2020-10-16 DIAGNOSIS — R059 Cough, unspecified: Secondary | ICD-10-CM

## 2020-10-16 DIAGNOSIS — R300 Dysuria: Secondary | ICD-10-CM | POA: Insufficient documentation

## 2020-10-16 DIAGNOSIS — Z0001 Encounter for general adult medical examination with abnormal findings: Secondary | ICD-10-CM

## 2020-10-16 DIAGNOSIS — K219 Gastro-esophageal reflux disease without esophagitis: Secondary | ICD-10-CM

## 2020-10-16 DIAGNOSIS — F411 Generalized anxiety disorder: Secondary | ICD-10-CM | POA: Insufficient documentation

## 2020-12-06 ENCOUNTER — Other Ambulatory Visit: Payer: Self-pay | Admitting: Nurse Practitioner

## 2020-12-06 DIAGNOSIS — M47816 Spondylosis without myelopathy or radiculopathy, lumbar region: Secondary | ICD-10-CM

## 2020-12-06 MED ORDER — CELECOXIB 100 MG PO CAPS: 100.0000 mg | ORAL_CAPSULE | Freq: Two times a day (BID) | ORAL | 1 refills | Status: DC

## 2020-12-17 DIAGNOSIS — H52223 Regular astigmatism, bilateral: Secondary | ICD-10-CM | POA: Diagnosis not present

## 2020-12-17 DIAGNOSIS — Z0100 Encounter for examination of eyes and vision without abnormal findings: Secondary | ICD-10-CM | POA: Diagnosis not present

## 2020-12-24 ENCOUNTER — Other Ambulatory Visit: Payer: Self-pay | Admitting: Nurse Practitioner

## 2020-12-24 DIAGNOSIS — L659 Nonscarring hair loss, unspecified: Secondary | ICD-10-CM

## 2021-02-19 ENCOUNTER — Ambulatory Visit: Payer: Self-pay | Admitting: Internal Medicine

## 2021-02-19 ENCOUNTER — Other Ambulatory Visit: Payer: Self-pay

## 2021-02-19 DIAGNOSIS — R238 Other skin changes: Secondary | ICD-10-CM

## 2021-02-19 NOTE — Progress Notes (Addendum)
Pt is seen for cosmetic treatment of facial aging  40  units of botox and 1 syringes of restlylane injected in 4 different areas of face

## 2021-02-26 ENCOUNTER — Ambulatory Visit: Payer: Medicare HMO | Admitting: Internal Medicine

## 2021-02-28 ENCOUNTER — Other Ambulatory Visit: Payer: Self-pay

## 2021-02-28 ENCOUNTER — Ambulatory Visit: Payer: Self-pay | Admitting: Internal Medicine

## 2021-02-28 DIAGNOSIS — Z411 Encounter for cosmetic surgery: Secondary | ICD-10-CM

## 2021-03-04 ENCOUNTER — Telehealth: Payer: Self-pay

## 2021-03-04 NOTE — Telephone Encounter (Signed)
Left voicemail for patient to schedule follow up for Botox for July 5 or July 7. Loma Sousa

## 2021-03-11 NOTE — Progress Notes (Signed)
Pt is here for cosmetic treatment  Botox follow up   Filler Juve'derm  2 syringes to cheeks and lower face Pt tolerated the procedure well Post care instructions given

## 2021-04-09 ENCOUNTER — Ambulatory Visit: Payer: Self-pay | Admitting: Internal Medicine

## 2021-04-09 ENCOUNTER — Other Ambulatory Visit: Payer: Self-pay

## 2021-04-09 DIAGNOSIS — Z411 Encounter for cosmetic surgery: Secondary | ICD-10-CM

## 2021-04-09 NOTE — Progress Notes (Signed)
Botox / Restylane is given, per protocol

## 2021-04-19 ENCOUNTER — Encounter: Payer: Self-pay | Admitting: Internal Medicine

## 2021-04-21 ENCOUNTER — Ambulatory Visit (INDEPENDENT_AMBULATORY_CARE_PROVIDER_SITE_OTHER): Payer: Medicare HMO | Admitting: Internal Medicine

## 2021-04-21 ENCOUNTER — Encounter: Payer: Self-pay | Admitting: Internal Medicine

## 2021-04-21 ENCOUNTER — Other Ambulatory Visit: Payer: Self-pay

## 2021-04-21 DIAGNOSIS — E782 Mixed hyperlipidemia: Secondary | ICD-10-CM

## 2021-04-21 DIAGNOSIS — Z1231 Encounter for screening mammogram for malignant neoplasm of breast: Secondary | ICD-10-CM

## 2021-04-21 DIAGNOSIS — F411 Generalized anxiety disorder: Secondary | ICD-10-CM | POA: Diagnosis not present

## 2021-04-21 MED ORDER — ROSUVASTATIN CALCIUM 5 MG PO TABS: ORAL_TABLET | ORAL | 1 refills | Status: DC

## 2021-04-21 MED ORDER — LORAZEPAM 0.5 MG PO TABS: ORAL_TABLET | ORAL | 1 refills | Status: DC

## 2021-04-21 NOTE — Progress Notes (Signed)
[facility] [place], [place] [address]  Internal MEDICINE  Office Visit Note  Patient Name: Paula [name]  [id number]  [id number]  Date of Service: 04/23/2021  Chief Complaint  Patient presents with   Hypertension   Follow-up    Medication refill for lorazepam    HPI  Pt is here for routine follow up No major complaints. just needs refills  Takes Aldactone for hair growth Takes ativan prn only  Due for mammogram as well  Mildly elevated Lipid profile   The 10-year ASCVD risk score Mikey Bussing DC Jr., et al., 2013) is: 7.4%   Values used to calculate the score:     Age: 49 years     Sex: Female     Is Non-Hispanic African American: No     Diabetic: No     Tobacco smoker: No     Systolic Blood Pressure: [id number]     Is BP treated: Yes     HDL Cholesterol: 61 mg/dL     Total Cholesterol: 161 mg/dL  Current Medication: Outpatient Encounter Medications as of 04/21/2021  Medication Sig   azelastine (ASTELIN) 0.1 % nasal spray Place 2 sprays into both nostrils 2 (two) times daily.   celecoxib (CELEBREX) 100 MG capsule Take 1 capsule (100 mg total) by mouth 2 (two) times daily.   chlorpheniramine-HYDROcodone (TUSSIONEX PENNKINETIC ER) 10-8 MG/5ML SUER Take 5 mLs by mouth every 12 (twelve) hours as needed for cough.   ibuprofen (ADVIL) 600 MG tablet Take 1 tablet (600 mg total) by mouth every 6 (six) hours as needed.   LORazepam (ATIVAN) 0.5 MG tablet Take one tab po qd as needed for anxiety   mometasone (NASONEX) 50 MCG/ACT nasal spray Place 2 sprays into the nose daily.   pantoprazole (PROTONIX) 40 MG tablet Take 1 tablet (40 mg total) by mouth daily.   rosuvastatin (CRESTOR) 5 MG tablet Take one tab twice a week   spironolactone (ALDACTONE) 25 MG tablet TAKE 1 TABLET BY MOUTH TWICE A DAY   [DISCONTINUED] LORazepam (ATIVAN) 0.5 MG tablet Take 1 tablet (0.5 mg total) by mouth 2 (two) times daily as needed for anxiety (Only take 1 tab as needed for anxiety).  (Patient taking differently: Take 0.5 mg by mouth daily as needed for anxiety (Only take 1 tab as needed for anxiety).)   [DISCONTINUED] nitrofurantoin, macrocrystal-monohydrate, (MACROBID) 100 MG capsule Take 1 capsule (100 mg total) by mouth 2 (two) times daily.   [DISCONTINUED] phenazopyridine (PYRIDIUM) 100 MG tablet Take 1 tablet (100 mg total) by mouth 3 (three) times daily as needed for pain.   [DISCONTINUED] fluticasone (FLONASE) 50 MCG/ACT nasal spray Place 2 sprays into both nostrils daily.   No facility-administered encounter medications on file as of 04/21/2021.    Surgical History: Past Surgical History:  Procedure Laterality Date   AUGMENTATION MAMMAPLASTY Bilateral 1987   HERNIA REPAIR  1962   skin cancer   08/11/2016   removal   TUBAL LIGATION  08/11/2016    Medical History: Past Medical History:  Diagnosis Date   Asthma    Environmental allergies    GERD (gastroesophageal reflux disease)    Hypertension    Migraine    Sinusitis     Family History: Family History  Problem Relation Age of Onset   Breast cancer Cousin    COPD Mother    Lung cancer Mother    Hypertension Mother    Ulcers Father    Bowel Disease Father  blockage, sepsis    Social History   Socioeconomic History   Marital status: Married    Spouse name: Not on file   Number of children: Not on file   Years of education: Not on file   Highest education level: Not on file  Occupational History   Not on file  Tobacco Use   Smoking status: Former    Types: Cigarettes    Quit date: 12/01/2004    Years since quitting: 16.4   Smokeless tobacco: Never  Vaping Use   Vaping Use: Never used  Substance and Sexual Activity   Alcohol use: Yes    Comment: ocassionally   Drug use: No   Sexual activity: Not on file  Other Topics Concern   Not on file  Social History Narrative   Not on file   Social Determinants of Health   Financial Resource Strain: Not on file  Food Insecurity:  Not on file  Transportation Needs: Not on file  Physical Activity: Not on file  Stress: Not on file  Social Connections: Not on file  Intimate Partner Violence: Not on file      Review of Systems  Constitutional:  Negative for chills, diaphoresis and fatigue.  HENT:  Negative for ear pain, postnasal drip and sinus pressure.   Eyes:  Negative for photophobia, discharge, redness, itching and visual disturbance.  Respiratory:  Negative for cough, shortness of breath and wheezing.   Cardiovascular:  Negative for chest pain, palpitations and leg swelling.  Gastrointestinal:  Negative for abdominal pain, constipation, diarrhea, nausea and vomiting.  Genitourinary:  Negative for dysuria and flank pain.  Musculoskeletal:  Negative for arthralgias, back pain, gait problem and neck pain.  Skin:  Negative for color change.  Allergic/Immunologic: Negative for environmental allergies and food allergies.  Neurological:  Negative for dizziness and headaches.  Hematological:  Does not bruise/bleed easily.  Psychiatric/Behavioral:  Negative for agitation, behavioral problems (depression) and hallucinations.    Vital Signs: BP 130/80   Pulse 76   Temp 97.8 F (36.6 C)   Resp 16   Ht '5\' 3"'$  (1.6 m)   Wt 192 lb (87.1 kg)   SpO2 97%   BMI 34.01 kg/m    Physical Exam Constitutional:      Appearance: Normal appearance.  HENT:     Head: Normocephalic and atraumatic.     Nose: Nose normal.     Mouth/Throat:     Mouth: Mucous membranes are moist.     Pharynx: No posterior oropharyngeal erythema.  Eyes:     Extraocular Movements: Extraocular movements intact.     Pupils: Pupils are equal, round, and reactive to light.  Cardiovascular:     Pulses: Normal pulses.     Heart sounds: Normal heart sounds.  Pulmonary:     Effort: Pulmonary effort is normal.     Breath sounds: Normal breath sounds.  Neurological:     General: No focal deficit present.     Mental Status: She is alert.   Psychiatric:        Mood and Affect: Mood normal.        Behavior: Behavior normal.       Assessment/Plan: 1. Mixed hyperlipidemia Patient had mild hyperlipidemia in the past was on a statin before however did not get a refill we will add Crestor today - rosuvastatin (CRESTOR) 5 MG tablet; Take one tab twice a week  Dispense: 24 tablet; Refill: 1  2. GAD (generalized anxiety disorder) Patient takes her lorazepam  only as PDMP is reviewed ORS is 150 - LORazepam (ATIVAN) 0.5 MG tablet; Take one tab po qd as needed for anxiety  Dispense: 30 tablet; Refill: 1  3. Visit for screening mammogram Mammogram is ordered today - MM DIGITAL SCREENING BILATERAL; Future   General Counseling: viany kamai understanding of the findings of todays visit and agrees with plan of treatment. I have discussed any further diagnostic evaluation that may be needed or ordered today. We also reviewed her medications today. she has been encouraged to call the office with any questions or concerns that should arise related to todays visit.    Orders Placed This Encounter  Procedures   MM DIGITAL SCREENING BILATERAL    Meds ordered this encounter  Medications   LORazepam (ATIVAN) 0.5 MG tablet    Sig: Take one tab po qd as needed for anxiety    Dispense:  30 tablet    Refill:  1   rosuvastatin (CRESTOR) 5 MG tablet    Sig: Take one tab twice a week    Dispense:  24 tablet    Refill:  1    Total time spent:35 Minutes Time spent includes review of chart, medications, test results, and follow up plan with the patient.   [place] Controlled Substance Database was reviewed by me.   Dr Lavera Guise Internal medicine

## 2021-05-07 ENCOUNTER — Other Ambulatory Visit: Payer: Self-pay

## 2021-05-07 ENCOUNTER — Ambulatory Visit: Payer: Medicare HMO | Admitting: Internal Medicine

## 2021-05-07 DIAGNOSIS — Z411 Encounter for cosmetic surgery: Secondary | ICD-10-CM

## 2021-05-08 ENCOUNTER — Encounter: Payer: Self-pay | Admitting: Internal Medicine

## 2021-05-13 ENCOUNTER — Other Ambulatory Visit: Payer: Self-pay | Admitting: Internal Medicine

## 2021-05-13 DIAGNOSIS — L659 Nonscarring hair loss, unspecified: Secondary | ICD-10-CM

## 2021-05-15 NOTE — Progress Notes (Signed)
Pt is here for cosmetic facial treatment   Consent on file   One syringe of Juvederm Volbella was injected multiple sites of face  One syringe of Juvederm Voluma XC was also injected   Pt tolerated the procedure well

## 2021-05-20 ENCOUNTER — Encounter: Payer: Self-pay | Admitting: Internal Medicine

## 2021-05-20 ENCOUNTER — Telehealth (INDEPENDENT_AMBULATORY_CARE_PROVIDER_SITE_OTHER): Payer: Medicare HMO | Admitting: Internal Medicine

## 2021-05-20 VITALS — Ht 63.0 in | Wt 190.0 lb

## 2021-05-20 DIAGNOSIS — M5441 Lumbago with sciatica, right side: Secondary | ICD-10-CM

## 2021-05-20 DIAGNOSIS — J3089 Other allergic rhinitis: Secondary | ICD-10-CM

## 2021-05-20 DIAGNOSIS — J45991 Cough variant asthma: Secondary | ICD-10-CM | POA: Diagnosis not present

## 2021-05-20 DIAGNOSIS — G8929 Other chronic pain: Secondary | ICD-10-CM | POA: Diagnosis not present

## 2021-05-20 MED ORDER — MONTELUKAST SODIUM 10 MG PO TABS: 10.0000 mg | ORAL_TABLET | Freq: Every day | ORAL | 3 refills | Status: DC

## 2021-05-20 MED ORDER — CYCLOBENZAPRINE HCL 10 MG PO TABS: 10.0000 mg | ORAL_TABLET | Freq: Every day | ORAL | 1 refills | Status: DC

## 2021-05-20 NOTE — Progress Notes (Signed)
[facility] [place],  [address]  Internal MEDICINE  Telephone Visit  Patient Name: Paula [name]  [id number]  [id number]  Date of Service: 05/20/2021  I connected with the patient at 200 by telephone and verified the patients identity using two identifiers.   I discussed the limitations, risks, security and privacy concerns of performing an evaluation and management service by telephone and the availability of in person appointments. I also discussed with the patient that there may be a patient responsible charge related to the service.  The patient expressed understanding and agrees to proceed.    Chief Complaint  Patient presents with   Telephone Assessment    [id number]   Telephone Screen   Pain    Right hip    Cough    Since she get her booster shot for covid     HPI Pt is connected for acute and sick visit  - C/O hip pain on the right side with back pain  ( outer side leg). Xray done in 2021), has been on Celebrex, getting worse  - Pt has allergies post nasal drip and cough at night, denies any COVID exposure - Denies any c/p or sob  - Takes Crestor for HLD  - Denies any heartburn or reflux symptoms  Current Medication: Outpatient Encounter Medications as of 05/20/2021  Medication Sig   azelastine (ASTELIN) 0.1 % nasal spray Place 2 sprays into both nostrils 2 (two) times daily.   celecoxib (CELEBREX) 100 MG capsule Take 1 capsule (100 mg total) by mouth 2 (two) times daily.   chlorpheniramine-HYDROcodone (TUSSIONEX PENNKINETIC ER) 10-8 MG/5ML SUER Take 5 mLs by mouth every 12 (twelve) hours as needed for cough.   cyclobenzaprine (FLEXERIL) 10 MG tablet Take 1 tablet (10 mg total) by mouth at bedtime. Take one tab po qhs for back spasm prn only   ibuprofen (ADVIL) 600 MG tablet Take 1 tablet (600 mg total) by mouth every 6 (six) hours as needed.   LORazepam (ATIVAN) 0.5 MG tablet Take one tab po qd as needed for anxiety   mometasone (NASONEX)  50 MCG/ACT nasal spray Place 2 sprays into the nose daily.   montelukast (SINGULAIR) 10 MG tablet Take 1 tablet (10 mg total) by mouth at bedtime.   pantoprazole (PROTONIX) 40 MG tablet Take 1 tablet (40 mg total) by mouth daily.   rosuvastatin (CRESTOR) 5 MG tablet Take one tab twice a week   spironolactone (ALDACTONE) 25 MG tablet TAKE 1 TABLET BY MOUTH TWICE A DAY   [DISCONTINUED] fluticasone (FLONASE) 50 MCG/ACT nasal spray Place 2 sprays into both nostrils daily.   No facility-administered encounter medications on file as of 05/20/2021.    Surgical History: Past Surgical History:  Procedure Laterality Date   AUGMENTATION MAMMAPLASTY Bilateral 1987   HERNIA REPAIR  1962   skin cancer   08/11/2016   removal   TUBAL LIGATION  08/11/2016    Medical History: Past Medical History:  Diagnosis Date   Asthma    Environmental allergies    GERD (gastroesophageal reflux disease)    Hypertension    Migraine    Sinusitis     Family History: Family History  Problem Relation Age of Onset   Breast cancer Cousin    COPD Mother    Lung cancer Mother    Hypertension Mother    Ulcers Father    Bowel Disease Father        blockage, sepsis    Social History  Socioeconomic History   Marital status: Married    Spouse name: Not on file   Number of children: Not on file   Years of education: Not on file   Highest education level: Not on file  Occupational History   Not on file  Tobacco Use   Smoking status: Former    Types: Cigarettes    Quit date: 12/01/2004    Years since quitting: 16.4   Smokeless tobacco: Never  Vaping Use   Vaping Use: Never used  Substance and Sexual Activity   Alcohol use: Yes    Comment: ocassionally   Drug use: No   Sexual activity: Not on file  Other Topics Concern   Not on file  Social History Narrative   Not on file   Social Determinants of Health   Financial Resource Strain: Not on file  Food Insecurity: Not on file  Transportation  Needs: Not on file  Physical Activity: Not on file  Stress: Not on file  Social Connections: Not on file  Intimate Partner Violence: Not on file      Review of Systems  Constitutional:  Negative for fatigue and fever.  HENT:  Negative for congestion, mouth sores and postnasal drip.   Respiratory:  Negative for cough.   Cardiovascular:  Negative for chest pain.  Genitourinary:  Negative for flank pain.  Musculoskeletal:  Positive for back pain.       Joint pain   Psychiatric/Behavioral: Negative.     Vital Signs: Ht '5\' 3"'$  (1.6 m)   Wt 190 lb (86.2 kg)   BMI 33.66 kg/m    Observation/Objective: No exam is performed     Assessment/Plan: 1. Chronic right-sided low back pain with right-sided sciatica Pt has been on Celebrex, add Flexeril, Xray Lspine reviwed, might need MRI, will refer to Ortho  - Ambulatory referral to Orthopedic Surgery - cyclobenzaprine (FLEXERIL) 10 MG tablet; Take 1 tablet (10 mg total) by mouth at bedtime. Take one tab po qhs for back spasm prn only  Dispense: 30 tablet; Refill: 1  2. Cough variant asthma Will add Singuliar, might need allergy testing  - montelukast (SINGULAIR) 10 MG tablet; Take 1 tablet (10 mg total) by mouth at bedtime.  Dispense: 30 tablet; Refill: 3  3. Non-seasonal allergic rhinitis due to other allergic trigger Continue INC, Antihistamine as before    General Counseling: adelicia shufford understanding of the findings of today's phone visit and agrees with plan of treatment. I have discussed any further diagnostic evaluation that may be needed or ordered today. We also reviewed her medications today. she has been encouraged to call the office with any questions or concerns that should arise related to todays visit.    Orders Placed This Encounter  Procedures   Ambulatory referral to Orthopedic Surgery    Meds ordered this encounter  Medications   cyclobenzaprine (FLEXERIL) 10 MG tablet    Sig: Take 1 tablet (10 mg  total) by mouth at bedtime. Take one tab po qhs for back spasm prn only    Dispense:  30 tablet    Refill:  1   montelukast (SINGULAIR) 10 MG tablet    Sig: Take 1 tablet (10 mg total) by mouth at bedtime.    Dispense:  30 tablet    Refill:  3    Time spent:25 Minutes    Dr Lavera Guise Internal medicine

## 2021-05-22 ENCOUNTER — Telehealth: Payer: Self-pay

## 2021-05-22 NOTE — Telephone Encounter (Signed)
Orthopedic appointment scheduled on 9/921/22 @ 9:00 w/ Clayburn Pert

## 2021-05-23 ENCOUNTER — Other Ambulatory Visit: Payer: Self-pay

## 2021-05-23 ENCOUNTER — Ambulatory Visit
Admission: RE | Admit: 2021-05-23 | Discharge: 2021-05-23 | Disposition: A | Discharge status: Home / Self Care | Payer: Medicare HMO | Source: Ambulatory Visit | Attending: Internal Medicine | Admitting: Internal Medicine

## 2021-05-23 DIAGNOSIS — Z1231 Encounter for screening mammogram for malignant neoplasm of breast: Secondary | ICD-10-CM | POA: Diagnosis not present

## 2021-06-02 ENCOUNTER — Other Ambulatory Visit: Payer: Self-pay

## 2021-06-02 DIAGNOSIS — F411 Generalized anxiety disorder: Secondary | ICD-10-CM

## 2021-06-02 DIAGNOSIS — R300 Dysuria: Secondary | ICD-10-CM

## 2021-06-02 DIAGNOSIS — I10 Essential (primary) hypertension: Secondary | ICD-10-CM

## 2021-06-02 DIAGNOSIS — K219 Gastro-esophageal reflux disease without esophagitis: Secondary | ICD-10-CM

## 2021-06-02 DIAGNOSIS — R059 Cough, unspecified: Secondary | ICD-10-CM

## 2021-06-02 DIAGNOSIS — Z0001 Encounter for general adult medical examination with abnormal findings: Secondary | ICD-10-CM

## 2021-06-02 MED ORDER — PANTOPRAZOLE SODIUM 40 MG PO TBEC: 40.0000 mg | DELAYED_RELEASE_TABLET | Freq: Every day | ORAL | 1 refills | Status: DC

## 2021-06-04 DIAGNOSIS — M7061 Trochanteric bursitis, right hip: Secondary | ICD-10-CM | POA: Diagnosis not present

## 2021-06-04 DIAGNOSIS — M5441 Lumbago with sciatica, right side: Secondary | ICD-10-CM | POA: Diagnosis not present

## 2021-06-05 DIAGNOSIS — H02834 Dermatochalasis of left upper eyelid: Secondary | ICD-10-CM | POA: Diagnosis not present

## 2021-06-05 DIAGNOSIS — H02831 Dermatochalasis of right upper eyelid: Secondary | ICD-10-CM | POA: Diagnosis not present

## 2021-06-13 ENCOUNTER — Other Ambulatory Visit: Payer: Self-pay

## 2021-06-13 ENCOUNTER — Telehealth (INDEPENDENT_AMBULATORY_CARE_PROVIDER_SITE_OTHER): Payer: Medicare HMO | Admitting: Nurse Practitioner

## 2021-06-13 VITALS — Ht 63.0 in | Wt 190.0 lb

## 2021-06-13 DIAGNOSIS — J018 Other acute sinusitis: Secondary | ICD-10-CM

## 2021-06-13 DIAGNOSIS — R051 Acute cough: Secondary | ICD-10-CM | POA: Diagnosis not present

## 2021-06-13 MED ORDER — BENZONATATE 100 MG PO CAPS: 100.0000 mg | ORAL_CAPSULE | Freq: Two times a day (BID) | ORAL | 0 refills | Status: DC | PRN

## 2021-06-13 MED ORDER — AMOXICILLIN-POT CLAVULANATE 875-125 MG PO TABS: 1.0000 | ORAL_TABLET | Freq: Two times a day (BID) | ORAL | 0 refills | Status: DC

## 2021-06-13 NOTE — Progress Notes (Signed)
[facility] [place], [place] [address]  Internal MEDICINE  Telephone Visit  Patient Name: Paula [name]  [id number]  [id number]  Date of Service: 06/13/2021  I connected with the patient at 10:10 AM by telephone and verified the patients identity using two identifiers.   I discussed the limitations, risks, security and privacy concerns of performing an evaluation and management service by telephone and the availability of in person appointments. I also discussed with the patient that there may be a patient responsible charge related to the service.  The patient expressed understanding and agrees to proceed.    Chief Complaint  Patient presents with   Telephone Screen    2 covid test is negative    Telephone Assessment    [phone]   Sinusitis   Cough    Green mucus going on 1 week     HPI [place] presents for a telehealth virtual visit for symptoms of sinusitis. She has a cough and green drainage x1 week. She also reports fatigue, wheezing, headaches, nasal congestion, sinus pain/pressure, postnasal drip, and runny nose. She denies fever, body aches, SOB or difficulty breathing,    Current Medication: Outpatient Encounter Medications as of 06/13/2021  Medication Sig   amoxicillin-clavulanate (AUGMENTIN) 875-125 MG tablet Take 1 tablet by mouth 2 (two) times daily.   azelastine (ASTELIN) 0.1 % nasal spray Place 2 sprays into both nostrils 2 (two) times daily.   benzonatate (TESSALON) 100 MG capsule Take 1 capsule (100 mg total) by mouth 2 (two) times daily as needed for cough.   celecoxib (CELEBREX) 100 MG capsule Take 1 capsule (100 mg total) by mouth 2 (two) times daily. (Patient taking differently: Take 100 mg by mouth 2 (two) times daily. [facility] 4 times a day)   chlorpheniramine-HYDROcodone (TUSSIONEX PENNKINETIC ER) 10-8 MG/5ML SUER Take 5 mLs by mouth every 12 (twelve) hours as needed for cough.   cyclobenzaprine (FLEXERIL) 10 MG tablet  Take 1 tablet (10 mg total) by mouth at bedtime. Take one tab po qhs for back spasm prn only   ibuprofen (ADVIL) 600 MG tablet Take 1 tablet (600 mg total) by mouth every 6 (six) hours as needed.   LORazepam (ATIVAN) 0.5 MG tablet Take one tab po qd as needed for anxiety   mometasone (NASONEX) 50 MCG/ACT nasal spray Place 2 sprays into the nose daily.   montelukast (SINGULAIR) 10 MG tablet Take 1 tablet (10 mg total) by mouth at bedtime.   pantoprazole (PROTONIX) 40 MG tablet Take 1 tablet (40 mg total) by mouth daily.   rosuvastatin (CRESTOR) 5 MG tablet Take one tab twice a week   spironolactone (ALDACTONE) 25 MG tablet TAKE 1 TABLET BY MOUTH TWICE A DAY   [DISCONTINUED] fluticasone (FLONASE) 50 MCG/ACT nasal spray Place 2 sprays into both nostrils daily.   No facility-administered encounter medications on file as of 06/13/2021.    Surgical History: Past Surgical History:  Procedure Laterality Date   AUGMENTATION MAMMAPLASTY Bilateral 1987   HERNIA REPAIR  1962   skin cancer   08/11/2016   removal   TUBAL LIGATION  08/11/2016    Medical History: Past Medical History:  Diagnosis Date   Asthma    Environmental allergies    GERD (gastroesophageal reflux disease)    Hypertension    Migraine    Sinusitis     Family History: Family History  Problem Relation Age of Onset   Breast cancer Cousin    COPD Mother  Lung cancer Mother    Hypertension Mother    Ulcers Father    Bowel Disease Father        blockage, sepsis    Social History   Socioeconomic History   Marital status: Married    Spouse name: Not on file   Number of children: Not on file   Years of education: Not on file   Highest education level: Not on file  Occupational History   Not on file  Tobacco Use   Smoking status: Former    Types: Cigarettes    Quit date: 12/01/2004    Years since quitting: 16.5   Smokeless tobacco: Never  Vaping Use   Vaping Use: Never used  Substance and Sexual Activity    Alcohol use: Yes    Comment: ocassionally   Drug use: No   Sexual activity: Not on file  Other Topics Concern   Not on file  Social History Narrative   Not on file   Social Determinants of Health   Financial Resource Strain: Not on file  Food Insecurity: Not on file  Transportation Needs: Not on file  Physical Activity: Not on file  Stress: Not on file  Social Connections: Not on file  Intimate Partner Violence: Not on file      Review of Systems  Constitutional:  Positive for fatigue. Negative for chills and fever.  HENT:  Positive for congestion, postnasal drip, rhinorrhea, sinus pressure, sinus pain and sore throat. Negative for ear pain, mouth sores and sneezing.   Eyes: Negative.   Respiratory:  Positive for cough and wheezing. Negative for chest tightness and shortness of breath.   Cardiovascular: Negative.  Negative for chest pain and palpitations.  Gastrointestinal: Negative.  Negative for abdominal pain, constipation, diarrhea, nausea and vomiting.  Genitourinary:  Negative for flank pain.  Musculoskeletal:  Negative for myalgias.  Skin:  Negative for rash.  Neurological:  Positive for headaches. Negative for dizziness and light-headedness.  Psychiatric/Behavioral: Negative.     Vital Signs: Ht 5\' 3"  (1.6 m)   Wt 190 lb (86.2 kg)   BMI 33.66 kg/m    Observation/Objective: Daesha is alert and oriented, engages in conversation. She does not sound like she is in any acute distress over audio call.     Assessment/Plan: 1. Acute non-recurrent sinusitis of other sinus Empiric antibiotic treatment for sinusitis prescribed.  - amoxicillin-clavulanate (AUGMENTIN) 875-125 MG tablet; Take 1 tablet by mouth 2 (two) times daily.  Dispense: 20 tablet; Refill: 0  2. Acute cough Medication prescribed for symptomatic relief of cough.  - benzonatate (TESSALON) 100 MG capsule; Take 1 capsule (100 mg total) by mouth 2 (two) times daily as needed for cough.  Dispense: 30  capsule; Refill: 0   General Counseling: rejeana fadness understanding of the findings of today's phone visit and agrees with plan of treatment. I have discussed any further diagnostic evaluation that may be needed or ordered today. We also reviewed her medications today. she has been encouraged to call the office with any questions or concerns that should arise related to todays visit.  Return if symptoms worsen or fail to improve.   No orders of the defined types were placed in this encounter.   Meds ordered this encounter  Medications   amoxicillin-clavulanate (AUGMENTIN) 875-125 MG tablet    Sig: Take 1 tablet by mouth 2 (two) times daily.    Dispense:  20 tablet    Refill:  0   benzonatate (TESSALON) 100 MG capsule  Sig: Take 1 capsule (100 mg total) by mouth 2 (two) times daily as needed for cough.    Dispense:  30 capsule    Refill:  0     Time spent:15 Minutes Time spent with patient included reviewing progress notes, labs, imaging studies, and discussing plan for follow up.  [place] Controlled Substance Database was reviewed by me for overdose risk score (ORS) if appropriate.  This patient was seen by Jonetta Osgood, FNP-C in collaboration with Dr. Clayborn Bigness as a part of collaborative care agreement.  Yader Criger R. Valetta Fuller, MSN, FNP-C Internal medicine

## 2021-06-25 ENCOUNTER — Other Ambulatory Visit: Payer: Self-pay | Admitting: Internal Medicine

## 2021-07-15 IMAGING — CR DG CHEST 2V
1 series · 2 of 2 positions shown · non-contrast
Comparison: None.

CLINICAL DATA: Cough.

EXAM:
CHEST - 2 VIEW

[Series 1: dg chest 2 view · 0.14mm/px · 2 of 2 slices shown]
[im 1/2]
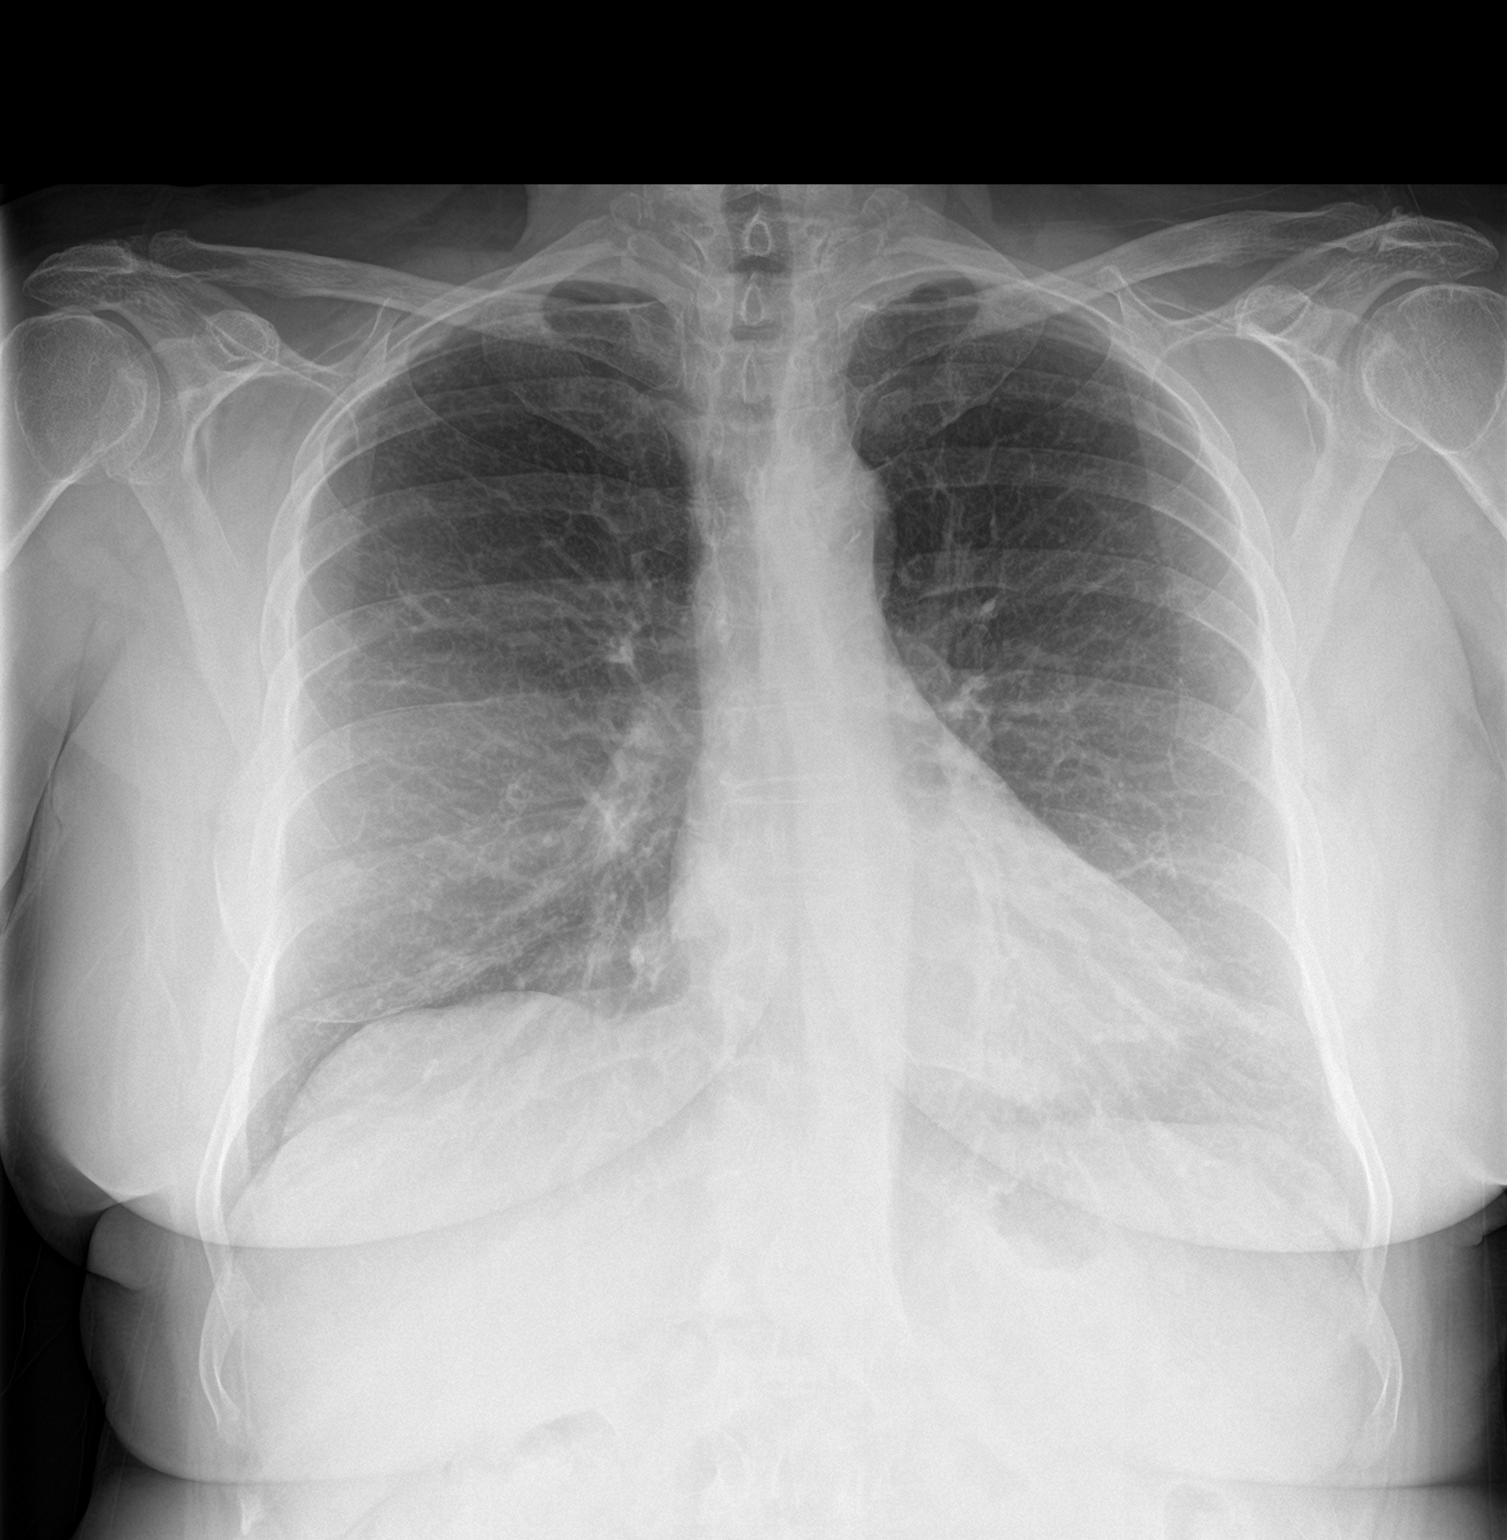
[im 2/2]
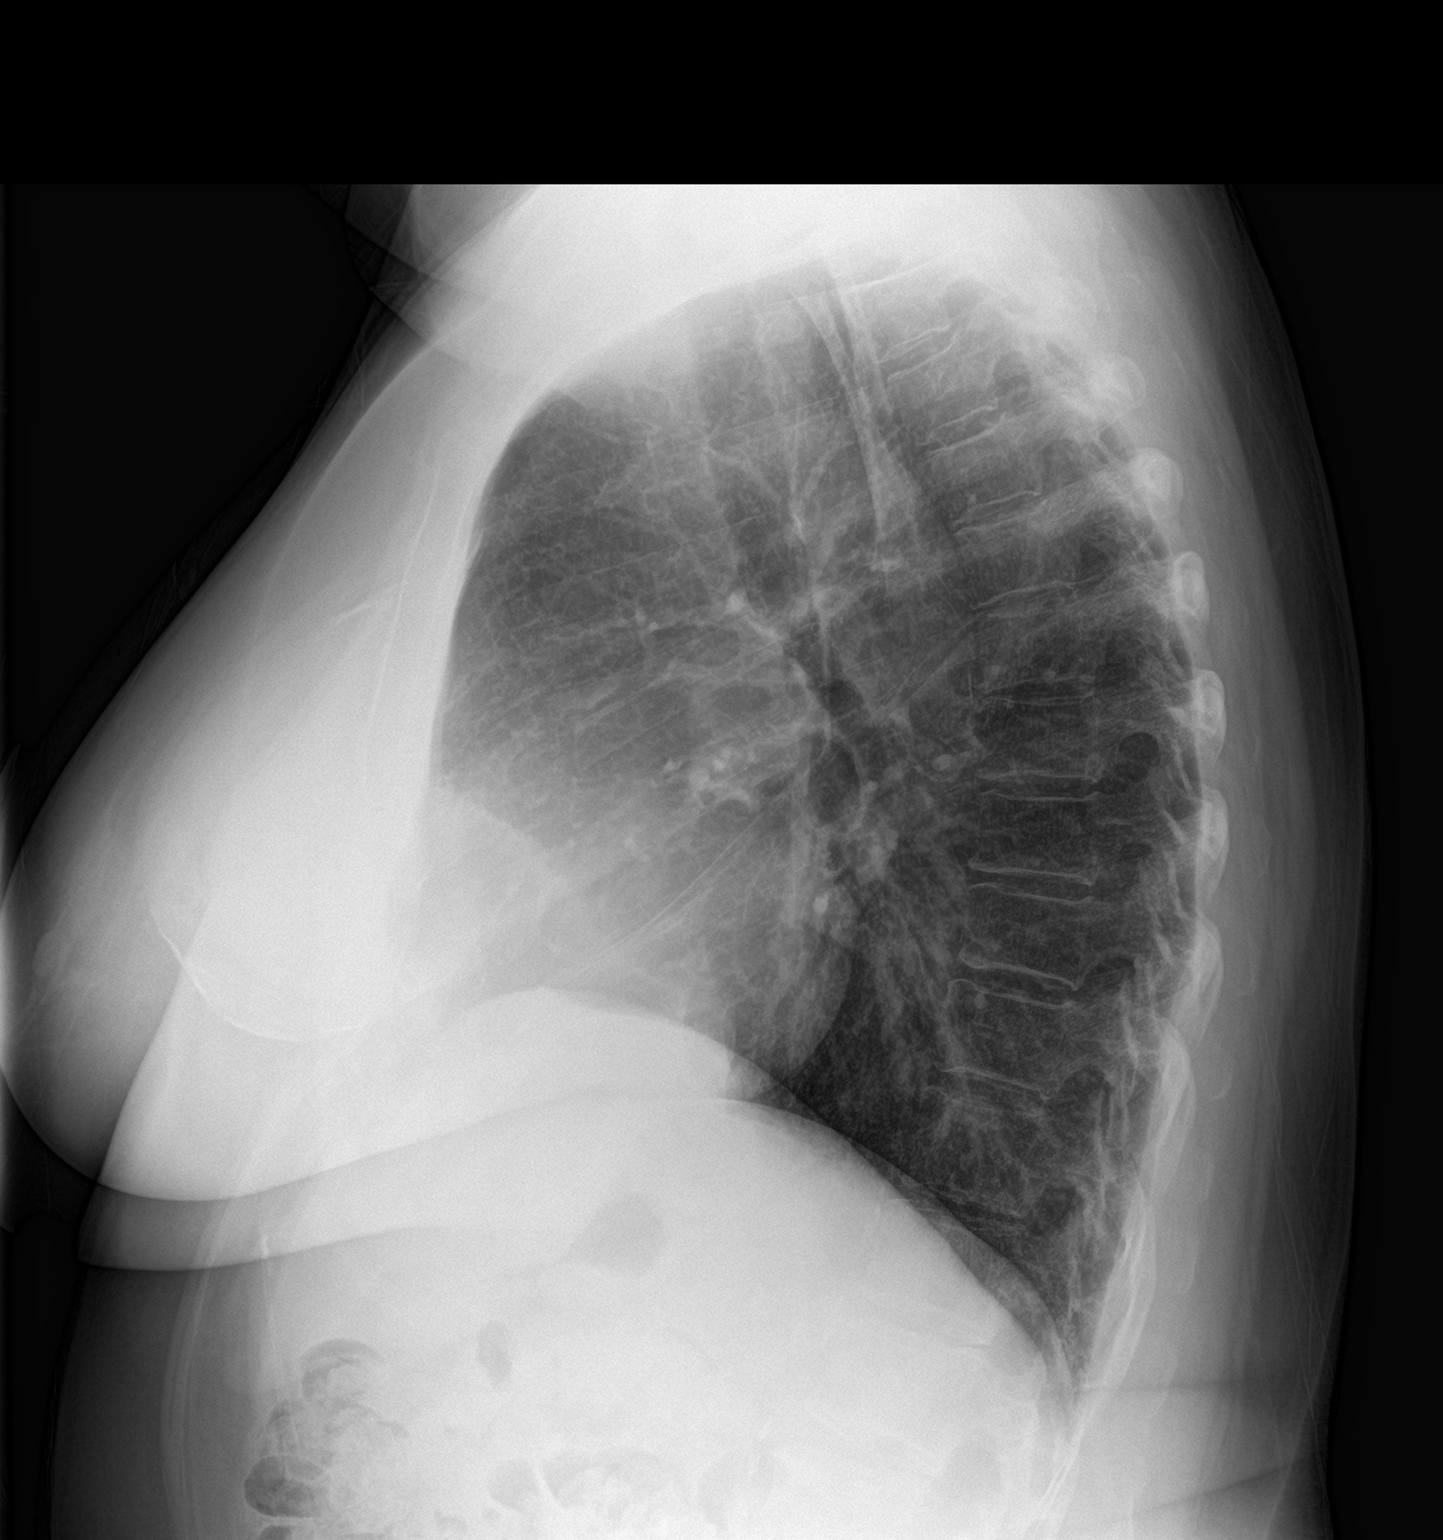

[2 of 2 positions shown; findings below may reference images not displayed]

FINDINGS: The heart size and mediastinal contours are within normal limits.
Both lungs are clear. No visible pleural effusions or pneumothorax.
No acute osseous abnormality.
IMPRESSION: No active cardiopulmonary disease.

## 2021-07-24 ENCOUNTER — Other Ambulatory Visit: Payer: Self-pay

## 2021-07-24 ENCOUNTER — Encounter: Payer: Self-pay | Admitting: Nurse Practitioner

## 2021-07-24 ENCOUNTER — Ambulatory Visit (INDEPENDENT_AMBULATORY_CARE_PROVIDER_SITE_OTHER): Payer: Medicare HMO | Admitting: Nurse Practitioner

## 2021-07-24 VITALS — BP 126/78 | HR 80 | Temp 97.4°F | Resp 16 | Ht 63.0 in | Wt 186.0 lb

## 2021-07-24 DIAGNOSIS — N3000 Acute cystitis without hematuria: Secondary | ICD-10-CM | POA: Diagnosis not present

## 2021-07-24 DIAGNOSIS — R300 Dysuria: Secondary | ICD-10-CM | POA: Diagnosis not present

## 2021-07-24 LAB — POCT URINALYSIS DIPSTICK
Bilirubin, UA: NEGATIVE
Glucose, UA: NEGATIVE
Ketones, UA: NEGATIVE
Nitrite, UA: NEGATIVE
Protein, UA: NEGATIVE
Spec Grav, UA: 1.025 (ref 1.010–1.025)
Urobilinogen, UA: 0.2 E.U./dL
pH, UA: 6 (ref 5.0–8.0)

## 2021-07-24 MED ORDER — NITROFURANTOIN MONOHYD MACRO 100 MG PO CAPS: 100.0000 mg | ORAL_CAPSULE | Freq: Two times a day (BID) | ORAL | 0 refills | Status: AC

## 2021-07-24 NOTE — Progress Notes (Signed)
[facility] [place],  [address]  Internal MEDICINE  Office Visit Note  Patient Name: Paula [name]  [id number]  [id number]  Date of Service: 07/24/2021  Chief Complaint  Patient presents with   Acute Visit   Urinary Tract Infection    Hurts to urinate, urgency voiding very little, frequency, pelvic pain and pressure    HPI Paula [name] presents for an acute sick visit for symptoms of UTI. She reports urinary frequency and urgency, pelvic pain and pressure, and supreapubic discomfort. She denies any fever, chills, flank pain, or back pain.    Current Medication:  Outpatient Encounter Medications as of 07/24/2021  Medication Sig   amoxicillin-clavulanate (AUGMENTIN) 875-125 MG tablet Take 1 tablet by mouth 2 (two) times daily.   azelastine (ASTELIN) 0.1 % nasal spray Place 2 sprays into both nostrils 2 (two) times daily.   benzonatate (TESSALON) 100 MG capsule Take 1 capsule (100 mg total) by mouth 2 (two) times daily as needed for cough.   celecoxib (CELEBREX) 100 MG capsule TAKE ONE CAPSULE BY MOUTH TWICE A DAY   chlorpheniramine-HYDROcodone (TUSSIONEX PENNKINETIC ER) 10-8 MG/5ML SUER Take 5 mLs by mouth every 12 (twelve) hours as needed for cough.   cyclobenzaprine (FLEXERIL) 10 MG tablet Take 1 tablet (10 mg total) by mouth at bedtime. Take one tab po qhs for back spasm prn only   ibuprofen (ADVIL) 600 MG tablet Take 1 tablet (600 mg total) by mouth every 6 (six) hours as needed.   LORazepam (ATIVAN) 0.5 MG tablet Take one tab po qd as needed for anxiety   mometasone (NASONEX) 50 MCG/ACT nasal spray Place 2 sprays into the nose daily.   montelukast (SINGULAIR) 10 MG tablet Take 1 tablet (10 mg total) by mouth at bedtime.   nitrofurantoin, macrocrystal-monohydrate, (MACROBID) 100 MG capsule Take 1 capsule (100 mg total) by mouth 2 (two) times daily for 10 days.   pantoprazole (PROTONIX) 40 MG tablet Take 1 tablet (40 mg total) by mouth daily.    rosuvastatin (CRESTOR) 5 MG tablet Take one tab twice a week   spironolactone (ALDACTONE) 25 MG tablet TAKE 1 TABLET BY MOUTH TWICE A DAY   [DISCONTINUED] fluticasone (FLONASE) 50 MCG/ACT nasal spray Place 2 sprays into both nostrils daily.   No facility-administered encounter medications on file as of 07/24/2021.      Medical History: Past Medical History:  Diagnosis Date   Asthma    Environmental allergies    GERD (gastroesophageal reflux disease)    Hypertension    Migraine    Sinusitis      Vital Signs: BP 126/78   Pulse 80   Temp (!) 97.4 F (36.3 C)   Resp 16   Ht 5\' 3"  (1.6 m)   Wt 186 lb (84.4 kg)   SpO2 97%   BMI 32.95 kg/m    Review of Systems  Constitutional: Negative.  Negative for chills, fatigue and fever.  HENT: Negative.  Negative for congestion, sinus pressure and sinus pain.   Eyes:  Negative for pain.  Respiratory: Negative.  Negative for cough, chest tightness, shortness of breath and wheezing.   Cardiovascular: Negative.  Negative for chest pain and palpitations.  Gastrointestinal: Negative.  Negative for abdominal pain, constipation, diarrhea, nausea and vomiting.  Genitourinary:  Positive for difficulty urinating, dysuria, frequency and urgency.       Foul odor to urine  Musculoskeletal: Negative.  Negative for myalgias.  Skin: Negative.  Negative for rash.  Neurological:  Negative  for headaches.  Psychiatric/Behavioral: Negative.     Physical Exam Vitals reviewed.  Constitutional:      General: She is not in acute distress.    Appearance: Normal appearance. She is obese. She is not ill-appearing.  HENT:     Head: Normocephalic and atraumatic.  Eyes:     Pupils: Pupils are equal, round, and reactive to light.  Cardiovascular:     Rate and Rhythm: Normal rate and regular rhythm.  Pulmonary:     Effort: Pulmonary effort is normal. No respiratory distress.  Neurological:     Mental Status: She is alert and oriented to person, place,  and time.     Cranial Nerves: No cranial nerve deficit.     Coordination: Coordination normal.     Gait: Gait normal.  Psychiatric:        Mood and Affect: Mood normal.        Behavior: Behavior normal.      Assessment/Plan: 1. Acute cystitis without hematuria Empiric antibiotic treatment prescribed. - nitrofurantoin, macrocrystal-monohydrate, (MACROBID) 100 MG capsule; Take 1 capsule (100 mg total) by mouth 2 (two) times daily for 10 days.  Dispense: 20 capsule; Refill: 0  2. Dysuria Positive for leukocytes and trace blood, sent for culture - POCT Urinalysis Dipstick - CULTURE, URINE COMPREHENSIVE   General Counseling: Paula [name] verbalizes understanding of the findings of todays visit and agrees with plan of treatment. I have discussed any further diagnostic evaluation that may be needed or ordered today. We also reviewed her medications today. she has been encouraged to call the office with any questions or concerns that should arise related to todays visit.    Counseling:    Orders Placed This Encounter  Procedures   CULTURE, URINE COMPREHENSIVE   POCT Urinalysis Dipstick    Meds ordered this encounter  Medications   nitrofurantoin, macrocrystal-monohydrate, (MACROBID) 100 MG capsule    Sig: Take 1 capsule (100 mg total) by mouth 2 (two) times daily for 10 days.    Dispense:  20 capsule    Refill:  0    Return if symptoms worsen or fail to improve.   Controlled Substance Database was reviewed by me for overdose risk score (ORS)  Time spent:10 Minutes Time spent with patient included reviewing progress notes, labs, imaging studies, and discussing plan for follow up.   This patient was seen by Jonetta Osgood, FNP-C in collaboration with Dr. Clayborn Bigness as a part of collaborative care agreement.  Arlean Thies R. Valetta Fuller, MSN, FNP-C Internal Medicine

## 2021-07-28 ENCOUNTER — Telehealth: Payer: Self-pay

## 2021-07-28 ENCOUNTER — Other Ambulatory Visit: Payer: Self-pay

## 2021-07-28 MED ORDER — DOXYCYCLINE HYCLATE 100 MG PO TABS: 100.0000 mg | ORAL_TABLET | Freq: Two times a day (BID) | ORAL | 0 refills | Status: DC

## 2021-07-28 MED ORDER — HYDROCOD POLST-CPM POLST ER 10-8 MG/5ML PO SUER
5.0000 mL | Freq: Two times a day (BID) | ORAL | 0 refills | Status: DC | PRN
Start: 2021-07-28 — End: 2022-06-16

## 2021-07-28 NOTE — Telephone Encounter (Signed)
meds sent

## 2021-07-28 NOTE — Telephone Encounter (Signed)
Done by Kris Hartmann

## 2021-07-31 DIAGNOSIS — H02831 Dermatochalasis of right upper eyelid: Secondary | ICD-10-CM | POA: Insufficient documentation

## 2021-07-31 DIAGNOSIS — H57813 Brow ptosis, bilateral: Secondary | ICD-10-CM | POA: Insufficient documentation

## 2021-07-31 DIAGNOSIS — H02834 Dermatochalasis of left upper eyelid: Secondary | ICD-10-CM | POA: Insufficient documentation

## 2021-07-31 LAB — CULTURE, URINE COMPREHENSIVE

## 2021-09-02 ENCOUNTER — Other Ambulatory Visit: Payer: Self-pay | Admitting: Internal Medicine

## 2021-09-02 DIAGNOSIS — J45991 Cough variant asthma: Secondary | ICD-10-CM

## 2021-09-12 DIAGNOSIS — H02831 Dermatochalasis of right upper eyelid: Secondary | ICD-10-CM | POA: Diagnosis not present

## 2021-09-12 DIAGNOSIS — H57813 Brow ptosis, bilateral: Secondary | ICD-10-CM | POA: Diagnosis not present

## 2021-09-12 DIAGNOSIS — M1990 Unspecified osteoarthritis, unspecified site: Secondary | ICD-10-CM | POA: Diagnosis not present

## 2021-09-12 DIAGNOSIS — E669 Obesity, unspecified: Secondary | ICD-10-CM | POA: Diagnosis not present

## 2021-09-12 DIAGNOSIS — K219 Gastro-esophageal reflux disease without esophagitis: Secondary | ICD-10-CM | POA: Diagnosis not present

## 2021-09-12 DIAGNOSIS — H02834 Dermatochalasis of left upper eyelid: Secondary | ICD-10-CM | POA: Diagnosis not present

## 2021-09-12 DIAGNOSIS — Z6832 Body mass index (BMI) 32.0-32.9, adult: Secondary | ICD-10-CM | POA: Diagnosis not present

## 2021-10-07 ENCOUNTER — Encounter: Payer: Medicare HMO | Admitting: Nurse Practitioner

## 2021-10-13 ENCOUNTER — Telehealth: Payer: Self-pay

## 2021-10-13 NOTE — Telephone Encounter (Signed)
Left vm to confirm 10/15/21 appointment-Toni

## 2021-10-15 ENCOUNTER — Other Ambulatory Visit: Payer: Self-pay

## 2021-10-15 ENCOUNTER — Ambulatory Visit (INDEPENDENT_AMBULATORY_CARE_PROVIDER_SITE_OTHER): Payer: Medicare HMO | Admitting: Nurse Practitioner

## 2021-10-15 ENCOUNTER — Encounter: Payer: Self-pay | Admitting: Nurse Practitioner

## 2021-10-15 VITALS — BP 138/62 | HR 95 | Temp 98.4°F | Resp 16 | Ht 63.0 in | Wt 180.8 lb

## 2021-10-15 DIAGNOSIS — R059 Cough, unspecified: Secondary | ICD-10-CM

## 2021-10-15 DIAGNOSIS — I10 Essential (primary) hypertension: Secondary | ICD-10-CM | POA: Diagnosis not present

## 2021-10-15 DIAGNOSIS — M722 Plantar fascial fibromatosis: Secondary | ICD-10-CM

## 2021-10-15 DIAGNOSIS — M67471 Ganglion, right ankle and foot: Secondary | ICD-10-CM | POA: Diagnosis not present

## 2021-10-15 DIAGNOSIS — Z0001 Encounter for general adult medical examination with abnormal findings: Secondary | ICD-10-CM | POA: Diagnosis not present

## 2021-10-15 DIAGNOSIS — J3089 Other allergic rhinitis: Secondary | ICD-10-CM | POA: Diagnosis not present

## 2021-10-15 DIAGNOSIS — E782 Mixed hyperlipidemia: Secondary | ICD-10-CM

## 2021-10-15 DIAGNOSIS — K219 Gastro-esophageal reflux disease without esophagitis: Secondary | ICD-10-CM

## 2021-10-15 DIAGNOSIS — L659 Nonscarring hair loss, unspecified: Secondary | ICD-10-CM | POA: Diagnosis not present

## 2021-10-15 DIAGNOSIS — E559 Vitamin D deficiency, unspecified: Secondary | ICD-10-CM

## 2021-10-15 DIAGNOSIS — R300 Dysuria: Secondary | ICD-10-CM

## 2021-10-15 DIAGNOSIS — E538 Deficiency of other specified B group vitamins: Secondary | ICD-10-CM

## 2021-10-15 DIAGNOSIS — F411 Generalized anxiety disorder: Secondary | ICD-10-CM

## 2021-10-15 LAB — POCT URINALYSIS DIPSTICK
Bilirubin, UA: NEGATIVE
Blood, UA: NEGATIVE
Glucose, UA: NEGATIVE
Leukocytes, UA: NEGATIVE
Nitrite, UA: NEGATIVE
Protein, UA: NEGATIVE
Spec Grav, UA: 1.02 (ref 1.010–1.025)
Urobilinogen, UA: 0.2 E.U./dL
pH, UA: 5 (ref 5.0–8.0)

## 2021-10-15 MED ORDER — ROSUVASTATIN CALCIUM 5 MG PO TABS: ORAL_TABLET | ORAL | 1 refills | Status: DC

## 2021-10-15 MED ORDER — MOMETASONE FUROATE 50 MCG/ACT NA SUSP: 2.0000 | Freq: Every day | NASAL | 0 refills | Status: DC

## 2021-10-15 MED ORDER — CELECOXIB 100 MG PO CAPS: 100.0000 mg | ORAL_CAPSULE | Freq: Two times a day (BID) | ORAL | 1 refills | Status: DC

## 2021-10-15 MED ORDER — SPIRONOLACTONE 25 MG PO TABS: 25.0000 mg | ORAL_TABLET | Freq: Two times a day (BID) | ORAL | 1 refills | Status: DC

## 2021-10-15 MED ORDER — PANTOPRAZOLE SODIUM 40 MG PO TBEC: 40.0000 mg | DELAYED_RELEASE_TABLET | Freq: Every day | ORAL | 1 refills | Status: DC

## 2021-10-15 MED ORDER — LORAZEPAM 0.5 MG PO TABS: ORAL_TABLET | ORAL | 1 refills | Status: DC

## 2021-10-15 NOTE — Progress Notes (Signed)
[facility] [place], [place] [address]  Internal MEDICINE  Office Visit Note  Patient Name: Paula [name]  [id number]  [id number]  Date of Service: 11/07/2021  Chief Complaint  Patient presents with   Annual Exam    Right foot has cyst, left heel painful, noticed about 3 to 4 weeks ago   Hypertension   Asthma   Gastroesophageal Reflux    HPI Paula [name] presents for an annual well visit and physical exam.  She is a well-appearing 66 year old female with hypertension, GERD and allergic rhinitis.  She is due for routine labs and is requesting a referral to podiatry.  She is wanting to be referred to podiatry for a cyst on her right foot and pain in her left heel that she noticed about 3 to 4 weeks ago.  Today she is having symptoms of a UTI and reports urinary urgency, urinary frequency and dysuria.  Urinalysis done in office today and urine culture will be sent to the lab.  The urinalysis was negative for UTI. She had her mammogram done in September 2022 and the result was BI-RADS Category 1 negative.  She had a colonoscopy in 2016 so she will be due for a routine colonoscopy in 2026.  She had a bone density scan done in 2021 which showed osteopenia in her left forearm but with otherwise normal.  She is not due for any other preventive screenings at this time.     Current Medication: Outpatient Encounter Medications as of 10/15/2021  Medication Sig   azelastine (ASTELIN) 0.1 % nasal spray Place 2 sprays into both nostrils 2 (two) times daily.   chlorpheniramine-HYDROcodone (TUSSIONEX PENNKINETIC ER) 10-8 MG/5ML SUER Take 5 mLs by mouth every 12 (twelve) hours as needed for cough.   cyclobenzaprine (FLEXERIL) 10 MG tablet Take 1 tablet (10 mg total) by mouth at bedtime. Take one tab po qhs for back spasm prn only   ibuprofen (ADVIL) 600 MG tablet Take 1 tablet (600 mg total) by mouth every 6 (six) hours as needed.   montelukast (SINGULAIR) 10 MG tablet TAKE ONE TABLET  BY MOUTH AT BEDTIME   [DISCONTINUED] benzonatate (TESSALON) 100 MG capsule Take 1 capsule (100 mg total) by mouth 2 (two) times daily as needed for cough.   [DISCONTINUED] celecoxib (CELEBREX) 100 MG capsule TAKE ONE CAPSULE BY MOUTH TWICE A DAY   [DISCONTINUED] doxycycline (VIBRA-TABS) 100 MG tablet Take 1 tablet (100 mg total) by mouth 2 (two) times daily. For 10 days   [DISCONTINUED] LORazepam (ATIVAN) 0.5 MG tablet Take one tab po qd as needed for anxiety   [DISCONTINUED] mometasone (NASONEX) 50 MCG/ACT nasal spray Place 2 sprays into the nose daily.   [DISCONTINUED] pantoprazole (PROTONIX) 40 MG tablet Take 1 tablet (40 mg total) by mouth daily.   [DISCONTINUED] rosuvastatin (CRESTOR) 5 MG tablet Take one tab twice a week   [DISCONTINUED] spironolactone (ALDACTONE) 25 MG tablet TAKE 1 TABLET BY MOUTH TWICE A DAY   LORazepam (ATIVAN) 0.5 MG tablet Take one tab po qd as needed for anxiety   mometasone (NASONEX) 50 MCG/ACT nasal spray Place 2 sprays into the nose daily.   pantoprazole (PROTONIX) 40 MG tablet Take 1 tablet (40 mg total) by mouth daily.   rosuvastatin (CRESTOR) 5 MG tablet Take one tab twice a week   spironolactone (ALDACTONE) 25 MG tablet Take 1 tablet (25 mg total) by mouth 2 (two) times daily.   [DISCONTINUED] amoxicillin-clavulanate (AUGMENTIN) 875-125 MG tablet Take 1 tablet by  mouth 2 (two) times daily. (Patient not taking: Reported on 10/15/2021)   [DISCONTINUED] celecoxib (CELEBREX) 100 MG capsule Take 1 capsule (100 mg total) by mouth 2 (two) times daily.   [DISCONTINUED] fluticasone (FLONASE) 50 MCG/ACT nasal spray Place 2 sprays into both nostrils daily.   No facility-administered encounter medications on file as of 10/15/2021.    Surgical History: Past Surgical History:  Procedure Laterality Date   AUGMENTATION MAMMAPLASTY Bilateral [place]   skin cancer   08/11/2016   removal   TUBAL LIGATION  08/11/2016    Medical History: Past Medical  History:  Diagnosis Date   Asthma    Environmental allergies    GERD (gastroesophageal reflux disease)    Hypertension    Migraine    Sinusitis     Family History: Family History  Problem Relation Age of Onset   Breast cancer Cousin    COPD Mother    Lung cancer Mother    Hypertension Mother    Ulcers Father    Bowel Disease Father        blockage, sepsis    Social History   Socioeconomic History   Marital status: Married    Spouse name: Not on file   Number of children: Not on file   Years of education: Not on file   Highest education level: Not on file  Occupational History   Not on file  Tobacco Use   Smoking status: Former    Types: Cigarettes    Quit date: 12/01/2004    Years since quitting: 16.9   Smokeless tobacco: Never  Vaping Use   Vaping Use: Never used  Substance and Sexual Activity   Alcohol use: Yes    Comment: ocassionally   Drug use: No   Sexual activity: Not on file  Other Topics Concern   Not on file  Social History Narrative   Not on file   Social Determinants of Health   Financial Resource Strain: Not on file  Food Insecurity: Not on file  Transportation Needs: Not on file  Physical Activity: Not on file  Stress: Not on file  Social Connections: Not on file  Intimate Partner Violence: Not on file      Review of Systems  Constitutional:  Negative for activity change, appetite change, chills, fatigue, fever and unexpected weight change.  HENT: Negative.  Negative for congestion, ear pain, rhinorrhea, sore throat and trouble swallowing.   Eyes: Negative.   Respiratory: Negative.  Negative for cough, chest tightness, shortness of breath and wheezing.   Cardiovascular: Negative.  Negative for chest pain and palpitations.  Gastrointestinal: Negative.  Negative for abdominal pain, blood in stool, constipation, diarrhea, nausea and vomiting.  Endocrine: Negative.   Genitourinary:  Positive for dysuria, frequency and urgency. Negative  for difficulty urinating and hematuria.  Musculoskeletal:  Positive for arthralgias. Negative for back pain, joint swelling, myalgias and neck pain.       Cyst of right foot and left heel pain  Skin: Negative.  Negative for rash and wound.  Allergic/Immunologic: Negative.  Negative for immunocompromised state.  Neurological: Negative.  Negative for dizziness, seizures, numbness and headaches.  Hematological: Negative.   Psychiatric/Behavioral: Negative.  Negative for behavioral problems, self-injury, sleep disturbance and suicidal ideas. The patient is not nervous/anxious.    Vital Signs: BP 138/62    Pulse 95    Temp 98.4 F (36.9 C)    Resp 16    Ht 5' 3"  (1.6 m)  Wt 180 lb 12.8 oz (82 kg)    SpO2 99%    BMI 32.03 kg/m    Physical Exam Vitals reviewed.  Constitutional:      General: She is awake. She is not in acute distress.    Appearance: Normal appearance. She is well-developed and well-groomed. She is obese. She is not ill-appearing or diaphoretic.  HENT:     Head: Normocephalic and atraumatic.     Right Ear: Tympanic membrane, ear canal and external ear normal.     Left Ear: Tympanic membrane, ear canal and external ear normal.     Nose: Nose normal. No congestion or rhinorrhea.     Mouth/Throat:     Lips: Pink.     Mouth: Mucous membranes are moist.     Pharynx: Oropharynx is clear. Uvula midline. No oropharyngeal exudate or posterior oropharyngeal erythema.  Eyes:     General: Lids are normal. Vision grossly intact. Gaze aligned appropriately. No scleral icterus.       Right eye: No discharge.        Left eye: No discharge.     Extraocular Movements: Extraocular movements intact.     Conjunctiva/sclera: Conjunctivae normal.     Pupils: Pupils are equal, round, and reactive to light.     Funduscopic exam:    Right eye: Red reflex present.        Left eye: Red reflex present. Neck:     Thyroid: No thyromegaly.     Vascular: No JVD.     Trachea: No tracheal  deviation.  Cardiovascular:     Rate and Rhythm: Normal rate and regular rhythm.     Pulses: Normal pulses.     Heart sounds: Normal heart sounds, S1 normal and S2 normal. No murmur heard.   No friction rub. No gallop.  Pulmonary:     Effort: Pulmonary effort is normal. No accessory muscle usage or respiratory distress.     Breath sounds: Normal breath sounds and air entry. No stridor. No wheezing or rales.  Chest:     Chest wall: No tenderness.     Comments: Declined clinical breast exam Abdominal:     General: Bowel sounds are normal. There is no distension.     Palpations: Abdomen is soft. There is no shifting dullness, fluid wave, mass or pulsatile mass.     Tenderness: There is no abdominal tenderness. There is no guarding or rebound.  Musculoskeletal:        General: No tenderness or deformity. Normal range of motion.     Cervical back: Normal range of motion and neck supple.     Right lower leg: No edema.     Left lower leg: No edema.  Lymphadenopathy:     Cervical: No cervical adenopathy.  Skin:    General: Skin is warm and dry.     Capillary Refill: Capillary refill takes less than 2 seconds.     Coloration: Skin is not pale.     Findings: No erythema or rash.  Neurological:     Mental Status: She is alert and oriented to person, place, and time.     Cranial Nerves: No cranial nerve deficit.     Motor: No abnormal muscle tone.     Coordination: Coordination normal.     Gait: Gait normal.     Deep Tendon Reflexes: Reflexes are normal and symmetric.  Psychiatric:        Mood and Affect: Mood and affect normal.  Behavior: Behavior normal. Behavior is cooperative.        Thought Content: Thought content normal.        Judgment: Judgment normal.       Assessment/Plan: 1. Encounter for general adult medical examination with abnormal findings Age-appropriate preventive screenings and vaccinations discussed, annual physical exam completed. Routine labs for health  maintenance ordered, see below. PHM updated.  - CBC with Differential/Platelet - CMP14+EGFR - pantoprazole (PROTONIX) 40 MG tablet; Take 1 tablet (40 mg total) by mouth daily.  Dispense: 90 tablet; Refill: 1  2. Essential hypertension Stable, continue medication as prescribed, routine labs ordered - CBC with Differential/Platelet - CMP14+EGFR  3. Hair loss Routine labs ordered, spironolactone refills ordered - CBC with Differential/Platelet - B12 and Folate Panel - CMP14+EGFR - Vitamin D (25 hydroxy) - TSH + free T4 - spironolactone (ALDACTONE) 25 MG tablet; Take 1 tablet (25 mg total) by mouth 2 (two) times daily.  Dispense: 180 tablet; Refill: 1  4. Plantar fasciitis of left foot Patient using over-the-counter medications for pain relief and this is manageable at this time.  Handouts provided to patient about plantar fasciitis and stretches and exercises that will help alleviate pain.  Patient encouraged to use nonpharmacological interventions including applying ice and/or heat and rest.  Referred to podiatry for further evaluation. - Ambulatory referral to Podiatry  5. Ganglion cyst of right foot Patient has a cyst on top of her right foot and is requesting podiatry referral, refer to podiatry. - Ambulatory referral to Podiatry  6. B12 deficiency Routine labs ordered - CBC with Differential/Platelet - B12 and Folate Panel - CMP14+EGFR  7. Vitamin D deficiency Routine labs ordered - Vitamin D (25 hydroxy)  8. Mixed hyperlipidemia Routine lab ordered - CMP14+EGFR - Lipid Profile - TSH + free T4 - rosuvastatin (CRESTOR) 5 MG tablet; Take one tab twice a week  Dispense: 24 tablet; Refill: 1  9. Non-seasonal allergic rhinitis due to other allergic trigger Refills ordered - mometasone (NASONEX) 50 MCG/ACT nasal spray; Place 2 sprays into the nose daily.  Dispense: 17 g; Refill: 0  10. Gastro-esophageal reflux disease without esophagitis Pantoprazole refills ordered,  routine labs ordered. - CBC with Differential/Platelet - CMP14+EGFR - pantoprazole (PROTONIX) 40 MG tablet; Take 1 tablet (40 mg total) by mouth daily.  Dispense: 90 tablet; Refill: 1  11. Dysuria Urinalysis done in office, negative for UTI, routine culture sent for annual wellness visit. - POCT Urinalysis Dipstick - UA/M w/rflx Culture, Routine - Microscopic Examination - Urine Culture, Reflex  12. Generalized anxiety disorder Stable, refills ordered. - LORazepam (ATIVAN) 0.5 MG tablet; Take one tab po qd as needed for anxiety  Dispense: 30 tablet; Refill: 1      General Counseling: Paula [name] verbalizes understanding of the findings of todays visit and agrees with plan of treatment. I have discussed any further diagnostic evaluation that may be needed or ordered today. We also reviewed her medications today. she has been encouraged to call the office with any questions or concerns that should arise related to todays visit.    Orders Placed This Encounter  Procedures   Microscopic Examination   Urine Culture, Reflex   CBC with Differential/Platelet   B12 and Folate Panel   CMP14+EGFR   Lipid Profile   Vitamin D (25 hydroxy)   TSH + free T4   UA/M w/rflx Culture, Routine   Ambulatory referral to Podiatry   POCT Urinalysis Dipstick    Meds ordered this encounter  Medications  LORazepam (ATIVAN) 0.5 MG tablet    Sig: Take one tab po qd as needed for anxiety    Dispense:  30 tablet    Refill:  1   DISCONTD: celecoxib (CELEBREX) 100 MG capsule    Sig: Take 1 capsule (100 mg total) by mouth 2 (two) times daily.    Dispense:  180 capsule    Refill:  1   mometasone (NASONEX) 50 MCG/ACT nasal spray    Sig: Place 2 sprays into the nose daily.    Dispense:  17 g    Refill:  0   pantoprazole (PROTONIX) 40 MG tablet    Sig: Take 1 tablet (40 mg total) by mouth daily.    Dispense:  90 tablet    Refill:  1   rosuvastatin (CRESTOR) 5 MG tablet    Sig: Take one tab twice a week     Dispense:  24 tablet    Refill:  1   spironolactone (ALDACTONE) 25 MG tablet    Sig: Take 1 tablet (25 mg total) by mouth 2 (two) times daily.    Dispense:  180 tablet    Refill:  1    Return in 6 months (on 04/14/2022) for F/U, med refill with Tira Lafferty or [facility].   Total time spent:30 Minutes Time spent includes review of chart, medications, test results, and follow up plan with the patient.   [place] Controlled Substance Database was reviewed by me.  This patient was seen by Jonetta Osgood, FNP-C in collaboration with Dr. Clayborn Bigness as a part of collaborative care agreement.  Avenly Roberge R. Valetta Fuller, MSN, FNP-C Internal medicine

## 2021-10-17 DIAGNOSIS — Z0001 Encounter for general adult medical examination with abnormal findings: Secondary | ICD-10-CM | POA: Diagnosis not present

## 2021-10-17 DIAGNOSIS — F411 Generalized anxiety disorder: Secondary | ICD-10-CM | POA: Diagnosis not present

## 2021-10-17 DIAGNOSIS — K219 Gastro-esophageal reflux disease without esophagitis: Secondary | ICD-10-CM | POA: Diagnosis not present

## 2021-10-17 DIAGNOSIS — E782 Mixed hyperlipidemia: Secondary | ICD-10-CM | POA: Diagnosis not present

## 2021-10-17 DIAGNOSIS — E538 Deficiency of other specified B group vitamins: Secondary | ICD-10-CM | POA: Diagnosis not present

## 2021-10-17 DIAGNOSIS — R059 Cough, unspecified: Secondary | ICD-10-CM | POA: Diagnosis not present

## 2021-10-17 DIAGNOSIS — R300 Dysuria: Secondary | ICD-10-CM | POA: Diagnosis not present

## 2021-10-17 DIAGNOSIS — I10 Essential (primary) hypertension: Secondary | ICD-10-CM | POA: Diagnosis not present

## 2021-10-17 DIAGNOSIS — L659 Nonscarring hair loss, unspecified: Secondary | ICD-10-CM | POA: Diagnosis not present

## 2021-10-18 LAB — CMP14+EGFR
ALT: 13 IU/L (ref 0–32)
AST: 18 IU/L (ref 0–40)
Albumin/Globulin Ratio: 2.1 (ref 1.2–2.2)
Albumin: 4.4 g/dL (ref 3.8–4.8)
Alkaline Phosphatase: 71 IU/L (ref 44–121)
BUN/Creatinine Ratio: 17 (ref 12–28)
BUN: 18 mg/dL (ref 8–27)
Bilirubin Total: 0.4 mg/dL (ref 0.0–1.2)
CO2: 23 mmol/L (ref 20–29)
Calcium: 9.5 mg/dL (ref 8.7–10.3)
Chloride: 102 mmol/L (ref 96–106)
Creatinine, Ser: 1.09 mg/dL — ABNORMAL HIGH (ref 0.57–1.00)
Globulin, Total: 2.1 g/dL (ref 1.5–4.5)
Glucose: 93 mg/dL (ref 70–99)
Potassium: 4.6 mmol/L (ref 3.5–5.2)
Sodium: 140 mmol/L (ref 134–144)
Total Protein: 6.5 g/dL (ref 6.0–8.5)
eGFR: 56 mL/min/{1.73_m2} — ABNORMAL LOW (ref >59)

## 2021-10-18 LAB — CBC WITH DIFFERENTIAL/PLATELET
Basophils Absolute: 0.1 10*3/uL (ref 0.0–0.2)
Basos: 1 %
EOS (ABSOLUTE): 0.2 10*3/uL (ref 0.0–0.4)
Eos: 4 %
Hematocrit: 35.8 % (ref 34.0–46.6)
Hemoglobin: 12 g/dL (ref 11.1–15.9)
Immature Grans (Abs): 0 10*3/uL (ref 0.0–0.1)
Immature Granulocytes: 1 %
Lymphocytes Absolute: 1.6 10*3/uL (ref 0.7–3.1)
Lymphs: 34 %
MCH: 30.2 pg (ref 26.6–33.0)
MCHC: 33.5 g/dL (ref 31.5–35.7)
MCV: 90 fL (ref 79–97)
Monocytes Absolute: 0.6 10*3/uL (ref 0.1–0.9)
Monocytes: 12 %
Neutrophils Absolute: 2.2 10*3/uL (ref 1.4–7.0)
Neutrophils: 48 %
Platelets: 236 10*3/uL (ref 150–450)
RBC: 3.98 x10E6/uL (ref 3.77–5.28)
RDW: 13 % (ref 11.7–15.4)
WBC: 4.6 10*3/uL (ref 3.4–10.8)

## 2021-10-18 LAB — LIPID PANEL
Chol/HDL Ratio: 2.1 ratio (ref 0.0–4.4)
Cholesterol, Total: 138 mg/dL (ref 100–199)
HDL: 67 mg/dL (ref >39)
LDL Chol Calc (NIH): 61 mg/dL (ref 0–99)
Triglycerides: 45 mg/dL (ref 0–149)
VLDL Cholesterol Cal: 10 mg/dL (ref 5–40)

## 2021-10-18 LAB — UA/M W/RFLX CULTURE, ROUTINE
Bilirubin, UA: NEGATIVE
Glucose, UA: NEGATIVE
Ketones, UA: NEGATIVE
Nitrite, UA: NEGATIVE
Protein,UA: NEGATIVE
RBC, UA: NEGATIVE
Specific Gravity, UA: 1.017 (ref 1.005–1.030)
Urobilinogen, Ur: 0.2 mg/dL (ref 0.2–1.0)
pH, UA: 5.5 (ref 5.0–7.5)

## 2021-10-18 LAB — MICROSCOPIC EXAMINATION
Bacteria, UA: NONE SEEN
Casts: NONE SEEN /lpf
RBC, Urine: NONE SEEN /hpf (ref 0–2)

## 2021-10-18 LAB — URINE CULTURE, REFLEX: Organism ID, Bacteria: NO GROWTH

## 2021-10-18 LAB — TSH+FREE T4
Free T4: 1.38 ng/dL (ref 0.82–1.77)
TSH: 2.5 u[IU]/mL (ref 0.450–4.500)

## 2021-10-18 LAB — VITAMIN D 25 HYDROXY (VIT D DEFICIENCY, FRACTURES): Vit D, 25-Hydroxy: 57.2 ng/mL (ref 30.0–100.0)

## 2021-10-18 LAB — B12 AND FOLATE PANEL
Folate: 10.9 ng/mL (ref >3.0)
Vitamin B-12: 590 pg/mL (ref 232–1245)

## 2021-10-20 ENCOUNTER — Ambulatory Visit: Payer: Medicare HMO | Admitting: Podiatry

## 2021-10-20 DIAGNOSIS — D2261 Melanocytic nevi of right upper limb, including shoulder: Secondary | ICD-10-CM | POA: Diagnosis not present

## 2021-10-20 DIAGNOSIS — M71371 Other bursal cyst, right ankle and foot: Secondary | ICD-10-CM | POA: Diagnosis not present

## 2021-10-20 DIAGNOSIS — D2262 Melanocytic nevi of left upper limb, including shoulder: Secondary | ICD-10-CM | POA: Diagnosis not present

## 2021-10-20 DIAGNOSIS — L821 Other seborrheic keratosis: Secondary | ICD-10-CM | POA: Diagnosis not present

## 2021-10-20 DIAGNOSIS — L570 Actinic keratosis: Secondary | ICD-10-CM | POA: Diagnosis not present

## 2021-10-20 DIAGNOSIS — D2272 Melanocytic nevi of left lower limb, including hip: Secondary | ICD-10-CM | POA: Diagnosis not present

## 2021-10-20 DIAGNOSIS — Z85828 Personal history of other malignant neoplasm of skin: Secondary | ICD-10-CM | POA: Diagnosis not present

## 2021-10-22 ENCOUNTER — Telehealth: Payer: Self-pay

## 2021-10-22 DIAGNOSIS — R7989 Other specified abnormal findings of blood chemistry: Secondary | ICD-10-CM

## 2021-10-22 DIAGNOSIS — R944 Abnormal results of kidney function studies: Secondary | ICD-10-CM

## 2021-10-22 NOTE — Progress Notes (Signed)
Please call patient and let her know her results: -metabolic panel is normal except for slightly elevated creatinine and slightly decreased GFR. Will repeat lab in 1 month. If still abnormal, will consider ultrasound of liver.  -CBC is normal, no anemia noted -cholesterol levels are normal.  -vitamin D and thyroid levels are normal.

## 2021-10-22 NOTE — Telephone Encounter (Signed)
-----   Message from Jonetta Osgood, NP sent at 10/22/2021  6:12 AM EST ----- Please call patient and let her know her results: -metabolic panel is normal except for slightly elevated creatinine and slightly decreased GFR. Will repeat lab in 1 month. If still abnormal, will consider ultrasound of liver.  -CBC is normal, no anemia noted -cholesterol levels are normal.  -vitamin D and thyroid levels are normal.

## 2021-10-27 ENCOUNTER — Ambulatory Visit: Payer: Medicare HMO | Admitting: Podiatry

## 2021-10-27 ENCOUNTER — Encounter: Payer: Self-pay | Admitting: Podiatry

## 2021-10-27 ENCOUNTER — Other Ambulatory Visit: Payer: Self-pay

## 2021-10-27 ENCOUNTER — Ambulatory Visit (INDEPENDENT_AMBULATORY_CARE_PROVIDER_SITE_OTHER): Payer: Medicare HMO

## 2021-10-27 DIAGNOSIS — M722 Plantar fascial fibromatosis: Secondary | ICD-10-CM

## 2021-10-27 DIAGNOSIS — M67471 Ganglion, right ankle and foot: Secondary | ICD-10-CM | POA: Diagnosis not present

## 2021-10-27 MED ORDER — MELOXICAM 15 MG PO TABS: 15.0000 mg | ORAL_TABLET | Freq: Every day | ORAL | 3 refills | Status: DC

## 2021-10-27 MED ORDER — METHYLPREDNISOLONE 4 MG PO TBPK: ORAL_TABLET | ORAL | 0 refills | Status: DC

## 2021-10-27 MED ORDER — TRIAMCINOLONE ACETONIDE 40 MG/ML IJ SUSP
40.0000 mg | Freq: Once | INTRAMUSCULAR | Status: AC
  Administered 2021-10-27: 40 mg

## 2021-10-27 NOTE — Progress Notes (Signed)
Subjective:  Patient ID: Paula [name], female    DOB: 1956-03-16,  MRN: [id number] HPI Chief Complaint  Patient presents with   Foot Pain    Plantar heel left - aching x 3 months, AM pain, tried stretching   Foot Pain    Dorsal midfoot right - knot x several months, aching   New Patient (Initial Visit)    66 y.o. female presents with the above complaint.   ROS: Denies fever chills nausea vomiting muscle aches pains calf pain back pain chest pain shortness of breath.  Past Medical History:  Diagnosis Date   Asthma    Environmental allergies    GERD (gastroesophageal reflux disease)    Hypertension    Migraine    Sinusitis    Past Surgical History:  Procedure Laterality Date   AUGMENTATION MAMMAPLASTY Bilateral [place]   skin cancer   08/11/2016   removal   TUBAL LIGATION  08/11/2016    Current Outpatient Medications:    meloxicam (MOBIC) 15 MG tablet, Take 1 tablet (15 mg total) by mouth daily., Disp: 30 tablet, Rfl: 3   methylPREDNISolone (MEDROL DOSEPAK) 4 MG TBPK tablet, 6 day dose pack - take as directed, Disp: 21 tablet, Rfl: 0   azelastine (ASTELIN) 0.1 % nasal spray, Place 2 sprays into both nostrils 2 (two) times daily., Disp: 30 mL, Rfl: 0   chlorpheniramine-HYDROcodone (TUSSIONEX PENNKINETIC ER) 10-8 MG/5ML SUER, Take 5 mLs by mouth every 12 (twelve) hours as needed for cough., Disp: 60 mL, Rfl: 0   cyclobenzaprine (FLEXERIL) 10 MG tablet, Take 1 tablet (10 mg total) by mouth at bedtime. Take one tab po qhs for back spasm prn only, Disp: 30 tablet, Rfl: 1   erythromycin ophthalmic ointment, 3 (three) times daily., Disp: , Rfl:    ibuprofen (ADVIL) 600 MG tablet, Take 1 tablet (600 mg total) by mouth every 6 (six) hours as needed., Disp: 30 tablet, Rfl: 0   LORazepam (ATIVAN) 0.5 MG tablet, Take one tab po qd as needed for anxiety, Disp: 30 tablet, Rfl: 1   mometasone (NASONEX) 50 MCG/ACT nasal spray, Place 2 sprays into the nose daily., Disp:  17 g, Rfl: 0   montelukast (SINGULAIR) 10 MG tablet, TAKE ONE TABLET BY MOUTH AT BEDTIME, Disp: 30 tablet, Rfl: 3   pantoprazole (PROTONIX) 40 MG tablet, Take 1 tablet (40 mg total) by mouth daily., Disp: 90 tablet, Rfl: 1   rosuvastatin (CRESTOR) 5 MG tablet, Take one tab twice a week, Disp: 24 tablet, Rfl: 1   spironolactone (ALDACTONE) 25 MG tablet, Take 1 tablet (25 mg total) by mouth 2 (two) times daily., Disp: 180 tablet, Rfl: 1  No Known Allergies Review of Systems Objective:  There were no vitals filed for this visit.  General: Well developed, nourished, in no acute distress, alert and oriented x3   Dermatological: Skin is warm, dry and supple bilateral. Nails x 10 are well maintained; remaining integument appears unremarkable at this time. There are no open sores, no preulcerative lesions, no rash or [name] of infection present.  Vascular: Dorsalis Pedis artery and Posterior Tibial artery pedal pulses are 2/4 bilateral with immedate capillary fill time. Pedal hair growth present. No varicosities and no lower extremity edema present bilateral.   Neruologic: Grossly intact via light touch bilateral. Vibratory intact via tuning fork bilateral. Protective threshold with Semmes Wienstein monofilament intact to all pedal sites bilateral. Patellar and Achilles deep tendon reflexes 2+ bilateral. No Babinski or clonus  noted bilateral.   Musculoskeletal: No gross boney pedal deformities bilateral. No pain, crepitus, or limitation noted with foot and ankle range of motion bilateral. Muscular strength 5/5 in all groups tested bilateral.  Ganglion cyst dorsal aspect right foot once drained of 0.5 cc of clear gelatinous fluid.  She also has pain to palpation MucoClear typical of the left heel  Gait: Unassisted, Nonantalgic.    Radiographs:  Radiographs taken today demonstrate osseously mature individual short plantar distally oriented calcaneal heel spur and soft tissue increase in density of  HydroClean with insertion site of her left heel no other acute findings are noted soft tissue swelling dorsal aspect of the right foot associated with some second TMT joint osteoarthritic changes.  Assessment & Plan:   Assessment: Planter fasciitis left ganglion cyst dorsal right  Plan: Discussed etiology pathology and surgical therapies after local anesthetic was administered I was able to aspirate 0.5 cc of clear joint fluid from the ganglion.  I also injected her left heel 20 mg Kenalog 5 mg Marcaine point maximal tenderness start her on methylprednisolone to be followed by meloxicam.  I also placed her on a plantar fascia brace and a night splint.  Discussed appropriate shoe gear stretching exercise ice therapy and shoe gear modifications.  Like to follow-up with her in 1 month     Vannessa Godown T. [place], [place]

## 2021-10-27 NOTE — Patient Instructions (Signed)

## 2021-11-07 ENCOUNTER — Encounter: Payer: Self-pay | Admitting: Nurse Practitioner

## 2021-12-03 ENCOUNTER — Other Ambulatory Visit: Payer: Self-pay

## 2021-12-03 ENCOUNTER — Encounter: Payer: Self-pay | Admitting: Podiatry

## 2021-12-03 ENCOUNTER — Ambulatory Visit: Payer: Medicare HMO | Admitting: Podiatry

## 2021-12-03 DIAGNOSIS — M722 Plantar fascial fibromatosis: Secondary | ICD-10-CM | POA: Diagnosis not present

## 2021-12-03 DIAGNOSIS — M67471 Ganglion, right ankle and foot: Secondary | ICD-10-CM

## 2021-12-03 NOTE — Progress Notes (Signed)
She presents today states that the cyst grew back on the dorsal aspect of the right foot and the plantar fasciitis is much better on the left foot. ? ?Objective: Vital signs are stable alert and oriented x3 no reproducible pain on palpation of the left heel.  Cyst has come back on the dorsal aspect of the right foot. ? ?Assessment: Ganglion cyst right foot hallux valgus hammertoe deformity left.  Resolution of Planter fasciitis left. ? ?Plan: Discussed etiology pathology and surgical therapies at this point she will notify me with questions or concerns or for a consult regarding surgery. ?

## 2021-12-15 DIAGNOSIS — R944 Abnormal results of kidney function studies: Secondary | ICD-10-CM | POA: Diagnosis not present

## 2021-12-15 DIAGNOSIS — R7989 Other specified abnormal findings of blood chemistry: Secondary | ICD-10-CM | POA: Diagnosis not present

## 2021-12-16 LAB — CMP14+EGFR
ALT: 15 IU/L (ref 0–32)
AST: 21 IU/L (ref 0–40)
Albumin/Globulin Ratio: 2.6 — ABNORMAL HIGH (ref 1.2–2.2)
Albumin: 4.6 g/dL (ref 3.8–4.8)
Alkaline Phosphatase: 66 IU/L (ref 44–121)
BUN/Creatinine Ratio: 12 (ref 12–28)
BUN: 13 mg/dL (ref 8–27)
Bilirubin Total: 0.3 mg/dL (ref 0.0–1.2)
CO2: 23 mmol/L (ref 20–29)
Calcium: 9.6 mg/dL (ref 8.7–10.3)
Chloride: 102 mmol/L (ref 96–106)
Creatinine, Ser: 1.07 mg/dL — ABNORMAL HIGH (ref 0.57–1.00)
Globulin, Total: 1.8 g/dL (ref 1.5–4.5)
Glucose: 93 mg/dL (ref 70–99)
Potassium: 4.6 mmol/L (ref 3.5–5.2)
Sodium: 138 mmol/L (ref 134–144)
Total Protein: 6.4 g/dL (ref 6.0–8.5)
eGFR: 57 mL/min/{1.73_m2} — ABNORMAL LOW (ref >59)

## 2021-12-29 NOTE — Progress Notes (Signed)
Please call patient and let her know that her serum creatinine and her GFR remain abnormal but unchanged and have not worsened.  Recommend ultrasound of kidneys to evaluate for any structural abnormalities that could be causing the abnormal labs. ?I would like to clarify my result note on the patient's labs from February where I said that a liver ultrasound would be considered if the labs remain abnormal.  The correct statement is that a ultrasound of the kidneys would be recommended. ?Please let me know if the patient is agreeable to having a renal ultrasound ordered, and I will send the order.

## 2021-12-30 ENCOUNTER — Telehealth: Payer: Self-pay

## 2021-12-30 NOTE — Telephone Encounter (Signed)
-----   Message from Jonetta Osgood, NP sent at 12/29/2021 11:02 PM EDT ----- ?Please call patient and let her know that her serum creatinine and her GFR remain abnormal but unchanged and have not worsened.  Recommend ultrasound of kidneys to evaluate for any structural abnormalities that could be causing the abnormal labs. ?I would like to clarify my result note on the patient's labs from February where I said that a liver ultrasound would be considered if the labs remain abnormal.  The correct statement is that a ultrasound of the kidneys would be recommended. ?Please let me know if the patient is agreeable to having a renal ultrasound ordered, and I will send the order. ?

## 2021-12-31 DIAGNOSIS — R208 Other disturbances of skin sensation: Secondary | ICD-10-CM | POA: Diagnosis not present

## 2021-12-31 DIAGNOSIS — L821 Other seborrheic keratosis: Secondary | ICD-10-CM | POA: Diagnosis not present

## 2021-12-31 DIAGNOSIS — L538 Other specified erythematous conditions: Secondary | ICD-10-CM | POA: Diagnosis not present

## 2021-12-31 DIAGNOSIS — L820 Inflamed seborrheic keratosis: Secondary | ICD-10-CM | POA: Diagnosis not present

## 2022-01-05 ENCOUNTER — Telehealth: Payer: Self-pay

## 2022-01-05 ENCOUNTER — Other Ambulatory Visit: Payer: Self-pay | Admitting: Nurse Practitioner

## 2022-01-05 DIAGNOSIS — R7989 Other specified abnormal findings of blood chemistry: Secondary | ICD-10-CM

## 2022-01-05 DIAGNOSIS — R944 Abnormal results of kidney function studies: Secondary | ICD-10-CM

## 2022-01-05 NOTE — Telephone Encounter (Signed)
Lvm to schedule ultrasound-Toni ?

## 2022-01-08 ENCOUNTER — Telehealth: Payer: Self-pay

## 2022-01-08 NOTE — Telephone Encounter (Signed)
Lvm to schedule u/s-Toni 

## 2022-01-15 ENCOUNTER — Encounter: Payer: Self-pay | Admitting: Nurse Practitioner

## 2022-01-15 ENCOUNTER — Telehealth: Payer: Self-pay

## 2022-01-15 ENCOUNTER — Telehealth (INDEPENDENT_AMBULATORY_CARE_PROVIDER_SITE_OTHER): Payer: Medicare HMO | Admitting: Nurse Practitioner

## 2022-01-15 VITALS — BP 122/75 | HR 87 | Temp 98.3°F | Resp 16 | Ht 63.0 in | Wt 175.0 lb

## 2022-01-15 DIAGNOSIS — J0110 Acute frontal sinusitis, unspecified: Secondary | ICD-10-CM

## 2022-01-15 DIAGNOSIS — J301 Allergic rhinitis due to pollen: Secondary | ICD-10-CM

## 2022-01-15 MED ORDER — AZITHROMYCIN 250 MG PO TABS: ORAL_TABLET | ORAL | 0 refills | Status: DC

## 2022-01-15 NOTE — Telephone Encounter (Signed)
Lmom to call us back 

## 2022-01-15 NOTE — Progress Notes (Signed)
[facility] [place], [place] [address]  Internal MEDICINE  Telephone Visit  Patient Name: Paula [name]  [id number]  [id number]  Date of Service: 01/15/2022  I connected with the patient at 1:50 PM by telephone and verified the patients identity using two identifiers.   I discussed the limitations, risks, security and privacy concerns of performing an evaluation and management service by telephone and the availability of in person appointments. I also discussed with the patient that there may be a patient responsible charge related to the service.  The patient expressed understanding and agrees to proceed.    Chief Complaint  Patient presents with   Acute Visit    Watery eyes, nasal drainage, some sneezing, eyes and throat and her main concern    Telephone Assessment    Phone call    Telephone Screen    [phone]   Sinusitis   Sore Throat   Cough     Paula [name] presents for a telehealth virtual visit for symptoms of sinusitis/allergic rhinitis. Reports sore throat, cough, nasal congestion, watery eyes, sneezing.    Current Medication: Outpatient Encounter Medications as of 01/15/2022  Medication Sig   azelastine (ASTELIN) 0.1 % nasal spray Place 2 sprays into both nostrils 2 (two) times daily.   celecoxib (CELEBREX) 100 MG capsule Take 100 mg by mouth 2 (two) times daily.   chlorpheniramine-HYDROcodone (TUSSIONEX PENNKINETIC ER) 10-8 MG/5ML SUER Take 5 mLs by mouth every 12 (twelve) hours as needed for cough.   cyclobenzaprine (FLEXERIL) 10 MG tablet Take 1 tablet (10 mg total) by mouth at bedtime. Take one tab po qhs for back spasm prn only   erythromycin ophthalmic ointment 3 (three) times daily.   ibuprofen (ADVIL) 600 MG tablet Take 1 tablet (600 mg total) by mouth every 6 (six) hours as needed.   LORazepam (ATIVAN) 0.5 MG tablet Take one tab po qd as needed for anxiety   meloxicam (MOBIC) 15 MG tablet Take 1 tablet (15 mg total) by mouth daily.    mometasone (NASONEX) 50 MCG/ACT nasal spray Place 2 sprays into the nose daily.   pantoprazole (PROTONIX) 40 MG tablet Take 1 tablet (40 mg total) by mouth daily.   rosuvastatin (CRESTOR) 5 MG tablet Take one tab twice a week   spironolactone (ALDACTONE) 25 MG tablet Take 1 tablet (25 mg total) by mouth 2 (two) times daily.   [DISCONTINUED] azithromycin (ZITHROMAX) 250 MG tablet Take 2 tablets on day 1, then 1 tablet daily on days 2 through 5   [DISCONTINUED] montelukast (SINGULAIR) 10 MG tablet TAKE ONE TABLET BY MOUTH AT BEDTIME   [DISCONTINUED] fluticasone (FLONASE) 50 MCG/ACT nasal spray Place 2 sprays into both nostrils daily.   No facility-administered encounter medications on file as of 01/15/2022.    Surgical History: Past Surgical History:  Procedure Laterality Date   AUGMENTATION MAMMAPLASTY Bilateral 1987   HERNIA REPAIR  1962   skin cancer   08/11/2016   removal   TUBAL LIGATION  08/11/2016    Medical History: Past Medical History:  Diagnosis Date   Asthma    Environmental allergies    GERD (gastroesophageal reflux disease)    Hypertension    Migraine    Sinusitis     Family History: Family History  Problem Relation Age of Onset   Breast cancer Cousin    COPD Mother    Lung cancer Mother    Hypertension Mother    Ulcers Father    Bowel Disease Father  blockage, sepsis    Social History   Socioeconomic History   Marital status: Married    Spouse name: Not on file   Number of children: Not on file   Years of education: Not on file   Highest education level: Not on file  Occupational History   Not on file  Tobacco Use   Smoking status: Former    Types: Cigarettes    Quit date: 12/01/2004    Years since quitting: 17.2   Smokeless tobacco: Never  Vaping Use   Vaping Use: Never used  Substance and Sexual Activity   Alcohol use: Yes    Comment: ocassionally   Drug use: No   Sexual activity: Not on file  Other Topics Concern   Not on file   Social History Narrative   Not on file   Social Determinants of Health   Financial Resource Strain: Not on file  Food Insecurity: Not on file  Transportation Needs: Not on file  Physical Activity: Not on file  Stress: Not on file  Social Connections: Not on file  Intimate Partner Violence: Not on file      Review of Systems  Constitutional:  Positive for fatigue. Negative for chills and unexpected weight change.  HENT:  Positive for congestion, postnasal drip, rhinorrhea, sneezing and sore throat. Negative for ear pain, sinus pressure and sinus pain.   Eyes:  Positive for discharge. Negative for redness.  Respiratory:  Positive for cough. Negative for chest tightness, shortness of breath and wheezing.   Cardiovascular: Negative.  Negative for chest pain and palpitations.  Gastrointestinal:  Negative for abdominal pain, constipation, diarrhea, nausea and vomiting.  Musculoskeletal:  Negative for arthralgias, back pain, joint swelling and neck pain.  Neurological:  Positive for headaches. Negative for tremors and numbness.  Psychiatric/Behavioral:  Behavioral problem: Depression.     Vital Signs: BP 122/75   Pulse 87   Temp 98.3 F (36.8 C)   Resp 16   Ht '5\' 3"'$  (1.6 m)   Wt 175 lb (79.4 kg)   SpO2 98%   BMI 31.00 kg/m    Observation/Objective: She is alert and oriented and engages in conversation appropriately. She does not sound as though she is in any distress over telephone call.     Assessment/Plan: 1. Acute non-recurrent frontal sinusitis 5 day zpack prescribed  2. Non-seasonal allergic rhinitis due to pollen Discussed switching OTC allergy medications or steroid nasal spray.    General Counseling: Paula [name] understanding of the findings of today's phone visit and agrees with plan of treatment. I have discussed any further diagnostic evaluation that may be needed or ordered today. We also reviewed her medications today. she has been encouraged to  call the office with any questions or concerns that should arise related to todays visit.  Return if symptoms worsen or fail to improve.   No orders of the defined types were placed in this encounter.   Meds ordered this encounter  Medications   DISCONTD: azithromycin (ZITHROMAX) 250 MG tablet    Sig: Take 2 tablets on day 1, then 1 tablet daily on days 2 through 5    Dispense:  6 tablet    Refill:  0    Time spent:10 Minutes Time spent with patient included reviewing progress notes, labs, imaging studies, and discussing plan for follow up.  [place] Controlled Substance Database was reviewed by me for overdose risk score (ORS) if appropriate.  This patient was seen by Jonetta Osgood, FNP-C in collaboration  with Dr. Clayborn Bigness as a part of collaborative care agreement.  Avonell Lenig R. Valetta Fuller, MSN, FNP-C Internal medicine

## 2022-01-20 ENCOUNTER — Ambulatory Visit (INDEPENDENT_AMBULATORY_CARE_PROVIDER_SITE_OTHER): Payer: Medicare HMO | Admitting: Nurse Practitioner

## 2022-01-20 ENCOUNTER — Encounter: Payer: Self-pay | Admitting: Nurse Practitioner

## 2022-01-20 VITALS — BP 129/73 | HR 84 | Temp 97.8°F | Resp 16 | Ht 63.0 in | Wt 170.0 lb

## 2022-01-20 DIAGNOSIS — J0141 Acute recurrent pansinusitis: Secondary | ICD-10-CM

## 2022-01-20 DIAGNOSIS — J301 Allergic rhinitis due to pollen: Secondary | ICD-10-CM | POA: Diagnosis not present

## 2022-01-20 MED ORDER — DOXYCYCLINE HYCLATE 100 MG PO TABS: 100.0000 mg | ORAL_TABLET | Freq: Two times a day (BID) | ORAL | 0 refills | Status: AC

## 2022-01-20 NOTE — Telephone Encounter (Signed)
Send message to alyssa 

## 2022-01-20 NOTE — Progress Notes (Incomplete)
[facility] ?[address] ?[place], [place] [address] ? ?Internal MEDICINE  ?Office Visit Note ? ?Patient Name: Paula [name] ? [id number]  ?[id number] ? ?Date of Service: 01/20/2022 ? ?Chief Complaint  ?Patient presents with  ?? Sinusitis  ?  Sinus infection has not improved, now has a cough that started Saturday  ? ? ? ?HPI ?Paula [name] presents for an acute sick visit for continued symptoms of a sinus infection that has not improved.  She was seen for a virtual visit approximately 5 days ago and was given a 5-day Z-Pak which did not help.  She switched her daytime allergy medication to Allegra which is not shown any significant improvement.  She continues to take generic Singulair at night and is using Nasonex nasal spray daily.  She reports that she still having nasal congestion and dry cough, runny nose, sinus drainage, ear pressure and watery eyes.  She reports that the sore throat has resolved.  She is using Opcon-A eye drops and her eyes are still very watery.  She denies any itchy eyes.  She reports that she does have environmental allergies and that the pollen has been pretty bad this season.  She also has a history of recurrent sinus infections and states that she does get 1 at least once a year but she has now been seen 3 times since September specifically for sinus issues. ? ? ? ? ?Current Medication: ? ?Outpatient Encounter Medications as of 01/20/2022  ?Medication Sig  ?? azelastine (ASTELIN) 0.1 % nasal spray Place 2 sprays into both nostrils 2 (two) times daily.  ?? celecoxib (CELEBREX) 100 MG capsule Take 100 mg by mouth 2 (two) times daily.  ?? chlorpheniramine-HYDROcodone (TUSSIONEX PENNKINETIC ER) 10-8 MG/5ML SUER Take 5 mLs by mouth every 12 (twelve) hours as needed for cough.  ?? cyclobenzaprine (FLEXERIL) 10 MG tablet Take 1 tablet (10 mg total) by mouth at bedtime. Take one tab po qhs for back spasm prn only  ?? doxycycline (VIBRA-TABS) 100 MG tablet Take 1 tablet (100 mg total) by mouth 2  (two) times daily for 21 days.  ?? erythromycin ophthalmic ointment 3 (three) times daily.  ?? ibuprofen (ADVIL) 600 MG tablet Take 1 tablet (600 mg total) by mouth every 6 (six) hours as needed.  ?? LORazepam (ATIVAN) 0.5 MG tablet Take one tab po qd as needed for anxiety  ?? meloxicam (MOBIC) 15 MG tablet Take 1 tablet (15 mg total) by mouth daily.  ?? mometasone (NASONEX) 50 MCG/ACT nasal spray Place 2 sprays into the nose daily.  ?? montelukast (SINGULAIR) 10 MG tablet TAKE ONE TABLET BY MOUTH AT BEDTIME  ?? pantoprazole (PROTONIX) 40 MG tablet Take 1 tablet (40 mg total) by mouth daily.  ?? rosuvastatin (CRESTOR) 5 MG tablet Take one tab twice a week  ?? spironolactone (ALDACTONE) 25 MG tablet Take 1 tablet (25 mg total) by mouth 2 (two) times daily.  ?? [DISCONTINUED] azithromycin (ZITHROMAX) 250 MG tablet Take 2 tablets on day 1, then 1 tablet daily on days 2 through 5  ?? [DISCONTINUED] fluticasone (FLONASE) 50 MCG/ACT nasal spray Place 2 sprays into both nostrils daily.  ? ?No facility-administered encounter medications on file as of 01/20/2022.  ? ? ? ? ?Medical History: ?Past Medical History:  ?Diagnosis Date  ?? Asthma   ?? Environmental allergies   ?? GERD (gastroesophageal reflux disease)   ?? Hypertension   ?? Migraine   ?? Sinusitis   ? ? ? ?Vital Signs: ?BP 129/73  Pulse 84   Temp 97.8 ?F (36.6 ?C)   Resp 16   Ht '5\' 3"'$  (1.6 m)   Wt 170 lb (77.1 kg)   SpO2 98%   BMI 30.11 kg/m?  ? ? ?Review of Systems ? ?Physical Exam ? ? ? ?Assessment/Plan: ? ? ?General Counseling: Paula [name] understanding of the findings of todays visit and agrees with plan of treatment. I have discussed any further diagnostic evaluation that may be needed or ordered today. We also reviewed her medications today. she has been encouraged to call the office with any questions or concerns that should arise related to todays visit. ? ? ? ?Counseling: ? ? ? ?No orders of the defined types were placed in this  encounter. ? ? ?Meds ordered this encounter  ?Medications  ?? doxycycline (VIBRA-TABS) 100 MG tablet  ?  Sig: Take 1 tablet (100 mg total) by mouth 2 (two) times daily for 21 days.  ?  Dispense:  42 tablet  ?  Refill:  0  ? ? ?Return in about 1 month (around 02/20/2022) for previously scheduled, F/U, Sanjuan Sawa PCP in june. ? ?[place] Controlled Substance Database was reviewed by me for overdose risk score (ORS) ? ?Time spent:*** Minutes ?Time spent with patient included reviewing progress notes, labs, imaging studies, and discussing plan for follow up.  ? ?This patient was seen by Jonetta Osgood, FNP-C in collaboration with Dr. Clayborn Bigness as a part of collaborative care agreement. ? ?Edem Tiegs R. Valetta Fuller, MSN, FNP-C ?Internal Medicine ?

## 2022-01-22 ENCOUNTER — Telehealth: Payer: Self-pay

## 2022-01-22 NOTE — Telephone Encounter (Signed)
lvm to move 03/02/22 u/s up to 5/15/ @ 4:00-Toni ?

## 2022-02-03 DIAGNOSIS — H02831 Dermatochalasis of right upper eyelid: Secondary | ICD-10-CM | POA: Diagnosis not present

## 2022-02-13 ENCOUNTER — Other Ambulatory Visit: Payer: Self-pay | Admitting: Nurse Practitioner

## 2022-02-13 DIAGNOSIS — J45991 Cough variant asthma: Secondary | ICD-10-CM

## 2022-02-19 ENCOUNTER — Telehealth: Payer: Self-pay

## 2022-02-19 NOTE — Telephone Encounter (Signed)
Left vm to confirm 02/25/22 appointment-Toni

## 2022-02-25 ENCOUNTER — Ambulatory Visit (INDEPENDENT_AMBULATORY_CARE_PROVIDER_SITE_OTHER): Payer: Medicare HMO

## 2022-02-25 DIAGNOSIS — R7989 Other specified abnormal findings of blood chemistry: Secondary | ICD-10-CM | POA: Diagnosis not present

## 2022-02-25 DIAGNOSIS — R944 Abnormal results of kidney function studies: Secondary | ICD-10-CM

## 2022-03-01 ENCOUNTER — Encounter: Payer: Self-pay | Admitting: Nurse Practitioner

## 2022-03-02 ENCOUNTER — Encounter: Payer: Self-pay | Admitting: Podiatry

## 2022-03-02 ENCOUNTER — Other Ambulatory Visit: Payer: Medicare HMO

## 2022-03-02 ENCOUNTER — Ambulatory Visit: Payer: Medicare HMO | Admitting: Podiatry

## 2022-03-02 DIAGNOSIS — M67471 Ganglion, right ankle and foot: Secondary | ICD-10-CM

## 2022-03-02 DIAGNOSIS — M898X7 Other specified disorders of bone, ankle and foot: Secondary | ICD-10-CM

## 2022-03-02 MED ORDER — TRIAMCINOLONE ACETONIDE 40 MG/ML IJ SUSP
20.0000 mg | Freq: Once | INTRAMUSCULAR | Status: AC
  Administered 2022-03-02: 20 mg

## 2022-03-02 NOTE — Progress Notes (Signed)
She presents today for follow-up of her ganglion cyst dorsal aspect of the right foot.  She states that his back she would like to have surgical correction to get rid of this ganglion cyst but she would like to have it drained today because she is going on multiple trips over the next several weeks.  Objective: Vital signs are stable alert and oriented x3 I reviewed her past medical history medications allergies surgeries and social history.  Pulses are palpable.  Neurologic sensorium is intact Deetjen reflexes are intact.  She has a dorsal ganglion measuring about 1.5 cm in total diameter.  It is fluctuant nonpulsatile.  Assessment ganglion cyst dorsal aspect right foot.  Plan: After local anesthetic was injected I then aspirated the ganglion today until it was flat.  Placed a compression dressing.  She tolerated this well.  She signed the consent today after reviewing 1 byline the right number I gave her ample time to ask which she suffered regarding excision ganglion dorsal aspect of the foot with a dorsal tarsal exostectomy.  She understands this is amenable to it signed out the patient's consent form we did discuss possible postop complications which may include but not limited to postop pain bleeding swelling recurrence need for further surgery overcorrection under correction loss of digit loss limb loss of life loss of sensation.  She understands that and is amenable to it.  We did discuss the surgery center and the anesthesia group.  We will follow-up with her in the near future for surgical intervention.

## 2022-03-05 ENCOUNTER — Ambulatory Visit (INDEPENDENT_AMBULATORY_CARE_PROVIDER_SITE_OTHER): Payer: Medicare HMO | Admitting: Physician Assistant

## 2022-03-05 ENCOUNTER — Encounter: Payer: Self-pay | Admitting: Nurse Practitioner

## 2022-03-05 DIAGNOSIS — N1330 Unspecified hydronephrosis: Secondary | ICD-10-CM

## 2022-03-05 DIAGNOSIS — Z0001 Encounter for general adult medical examination with abnormal findings: Secondary | ICD-10-CM

## 2022-03-05 DIAGNOSIS — H04203 Unspecified epiphora, bilateral lacrimal glands: Secondary | ICD-10-CM | POA: Diagnosis not present

## 2022-03-05 DIAGNOSIS — J45991 Cough variant asthma: Secondary | ICD-10-CM

## 2022-03-05 DIAGNOSIS — F411 Generalized anxiety disorder: Secondary | ICD-10-CM

## 2022-03-05 DIAGNOSIS — L659 Nonscarring hair loss, unspecified: Secondary | ICD-10-CM

## 2022-03-05 DIAGNOSIS — K219 Gastro-esophageal reflux disease without esophagitis: Secondary | ICD-10-CM

## 2022-03-05 MED ORDER — CELECOXIB 100 MG PO CAPS: 100.0000 mg | ORAL_CAPSULE | Freq: Two times a day (BID) | ORAL | 2 refills | Status: DC

## 2022-03-05 MED ORDER — MONTELUKAST SODIUM 10 MG PO TABS: 10.0000 mg | ORAL_TABLET | Freq: Every day | ORAL | 1 refills | Status: DC

## 2022-03-05 MED ORDER — SPIRONOLACTONE 25 MG PO TABS: 25.0000 mg | ORAL_TABLET | Freq: Two times a day (BID) | ORAL | 1 refills | Status: DC

## 2022-03-05 MED ORDER — LORAZEPAM 0.5 MG PO TABS: ORAL_TABLET | ORAL | 1 refills | Status: DC

## 2022-03-05 MED ORDER — PANTOPRAZOLE SODIUM 40 MG PO TBEC: 40.0000 mg | DELAYED_RELEASE_TABLET | Freq: Every day | ORAL | 1 refills | Status: DC

## 2022-03-05 NOTE — Progress Notes (Signed)
[facility] [place], [place] [address]  Internal MEDICINE  Office Visit Note  Patient Name: Paula [name]  [id number]  [id number]  Date of Service: 03/18/2022  Chief Complaint  Patient presents with   Follow-up    Watery eyes, has been going on for a couple of months    Gastroesophageal Reflux   Hypertension   Asthma   Results    HPI Pt is here for routine follow up and for renal US results -Pt had a sinus infection about a month ago, which has resolved but still has watery eyes -She is taking allegra in AM and singulair and uses nasal spray. She has tried some OTC eye drops but isnt sure which ones. Suggested trying Alaway if not already tried -Does need refills today -renal US did show mild hydronephrosis of the right kidney without evidence of renal mass or nephrolithiasis. Left renal normal. She denies any symptoms therefore will monitor  Current Medication: Outpatient Encounter Medications as of 03/05/2022  Medication Sig   azelastine (ASTELIN) 0.1 % nasal spray Place 2 sprays into both nostrils 2 (two) times daily.   chlorpheniramine-HYDROcodone (TUSSIONEX PENNKINETIC ER) 10-8 MG/5ML SUER Take 5 mLs by mouth every 12 (twelve) hours as needed for cough.   cyclobenzaprine (FLEXERIL) 10 MG tablet Take 1 tablet (10 mg total) by mouth at bedtime. Take one tab po qhs for back spasm prn only   erythromycin ophthalmic ointment 3 (three) times daily.   ibuprofen (ADVIL) 600 MG tablet Take 1 tablet (600 mg total) by mouth every 6 (six) hours as needed.   mometasone (NASONEX) 50 MCG/ACT nasal spray Place 2 sprays into the nose daily.   rosuvastatin (CRESTOR) 5 MG tablet Take one tab twice a week   [DISCONTINUED] celecoxib (CELEBREX) 100 MG capsule Take 100 mg by mouth 2 (two) times daily.   [DISCONTINUED] LORazepam (ATIVAN) 0.5 MG tablet Take one tab po qd as needed for anxiety   [DISCONTINUED] meloxicam (MOBIC) 15 MG tablet Take 1 tablet (15 mg total) by  mouth daily.   [DISCONTINUED] montelukast (SINGULAIR) 10 MG tablet TAKE 1 TABLET BY MOUTH AT BEDTIME   [DISCONTINUED] pantoprazole (PROTONIX) 40 MG tablet Take 1 tablet (40 mg total) by mouth daily.   [DISCONTINUED] spironolactone (ALDACTONE) 25 MG tablet Take 1 tablet (25 mg total) by mouth 2 (two) times daily.   celecoxib (CELEBREX) 100 MG capsule Take 1 capsule (100 mg total) by mouth 2 (two) times daily.   LORazepam (ATIVAN) 0.5 MG tablet Take one tab po qd as needed for anxiety   montelukast (SINGULAIR) 10 MG tablet Take 1 tablet (10 mg total) by mouth at bedtime.   pantoprazole (PROTONIX) 40 MG tablet Take 1 tablet (40 mg total) by mouth daily.   spironolactone (ALDACTONE) 25 MG tablet Take 1 tablet (25 mg total) by mouth 2 (two) times daily.   [DISCONTINUED] celecoxib (CELEBREX) 100 MG capsule Take 1 capsule (100 mg total) by mouth 2 (two) times daily.   [DISCONTINUED] fluticasone (FLONASE) 50 MCG/ACT nasal spray Place 2 sprays into both nostrils daily.   No facility-administered encounter medications on file as of 03/05/2022.    Surgical History: Past Surgical History:  Procedure Laterality Date   AUGMENTATION MAMMAPLASTY Bilateral [place]   skin cancer   08/11/2016   removal   TUBAL LIGATION  08/11/2016    Medical History: Past Medical History:  Diagnosis Date   Asthma    Environmental allergies  GERD (gastroesophageal reflux disease)    Hypertension    Migraine    Sinusitis     Family History: Family History  Problem Relation Age of Onset   Breast cancer Cousin    COPD Mother    Lung cancer Mother    Hypertension Mother    Ulcers Father    Bowel Disease Father        blockage, sepsis    Social History   Socioeconomic History   Marital status: Married    Spouse name: Not on file   Number of children: Not on file   Years of education: Not on file   Highest education level: Not on file  Occupational History   Not on file  Tobacco  Use   Smoking status: Former    Types: Cigarettes    Quit date: 12/01/2004    Years since quitting: 17.3   Smokeless tobacco: Never  Vaping Use   Vaping Use: Never used  Substance and Sexual Activity   Alcohol use: Yes    Comment: ocassionally   Drug use: No   Sexual activity: Not on file  Other Topics Concern   Not on file  Social History Narrative   Not on file   Social Determinants of Health   Financial Resource Strain: Not on file  Food Insecurity: Not on file  Transportation Needs: Not on file  Physical Activity: Not on file  Stress: Not on file  Social Connections: Not on file  Intimate Partner Violence: Not on file      Review of Systems  Constitutional:  Negative for chills, fatigue and unexpected weight change.  HENT:  Positive for postnasal drip. Negative for congestion, rhinorrhea, sneezing and sore throat.   Eyes:  Negative for redness.       Watery eyes  Respiratory:  Negative for cough, chest tightness and shortness of breath.   Cardiovascular:  Negative for chest pain and palpitations.  Gastrointestinal:  Negative for abdominal pain, constipation, diarrhea, nausea and vomiting.  Genitourinary:  Negative for dysuria and frequency.  Musculoskeletal:  Negative for arthralgias, back pain, joint swelling and neck pain.  Skin:  Negative for rash.  Allergic/Immunologic: Positive for environmental allergies.  Neurological: Negative.  Negative for tremors and numbness.  Hematological:  Negative for adenopathy. Does not bruise/bleed easily.  Psychiatric/Behavioral:  Negative for behavioral problems (Depression), sleep disturbance and suicidal ideas. The patient is not nervous/anxious.     Vital Signs: BP 133/67   Pulse 73   Temp 98 F (36.7 C)   Resp 16   Ht '5\' 3"'$  (1.6 m)   Wt 172 lb 12.8 oz (78.4 kg)   SpO2 98%   BMI 30.61 kg/m    Physical Exam Vitals and nursing note reviewed.  Constitutional:      General: She is not in acute distress.     Appearance: Normal appearance. She is obese. She is not ill-appearing.  HENT:     Head: Normocephalic and atraumatic.  Eyes:     Pupils: Pupils are equal, round, and reactive to light.  Cardiovascular:     Rate and Rhythm: Normal rate and regular rhythm.  Pulmonary:     Effort: Pulmonary effort is normal. No respiratory distress.  Musculoskeletal:        General: Normal range of motion.  Skin:    General: Skin is warm and dry.  Neurological:     Mental Status: She is alert and oriented to person, place, and time.  Cranial Nerves: No cranial nerve deficit.     Coordination: Coordination normal.     Gait: Gait normal.  Psychiatric:        Mood and Affect: Mood normal.        Behavior: Behavior normal.        Assessment/Plan: 1. Watery eyes Will try alternative eye drop such as Alaway and continue antihistamine and nasal spray  2. Hydronephrosis, right Mild right sided hydronephrosis on [place] that is asymptomatic without mass or stone seen, will continue to monitor  3. Gastro-esophageal reflux disease without esophagitis - pantoprazole (PROTONIX) 40 MG tablet; Take 1 tablet (40 mg total) by mouth daily.  Dispense: 90 tablet; Refill: 1  4. Cough variant asthma - montelukast (SINGULAIR) 10 MG tablet; Take 1 tablet (10 mg total) by mouth at bedtime.  Dispense: 90 tablet; Refill: 1  5. Generalized anxiety disorder May continue ativan as needed - LORazepam (ATIVAN) 0.5 MG tablet; Take one tab po qd as needed for anxiety  Dispense: 30 tablet; Refill: 1  6. Hair loss - spironolactone (ALDACTONE) 25 MG tablet; Take 1 tablet (25 mg total) by mouth 2 (two) times daily.  Dispense: 180 tablet; Refill: 1   General Counseling: Ellisha verbalizes understanding of the findings of todays visit and agrees with plan of treatment. I have discussed any further diagnostic evaluation that may be needed or ordered today. We also reviewed her medications today. she has been encouraged to call the  office with any questions or concerns that should arise related to todays visit.    No orders of the defined types were placed in this encounter.   Meds ordered this encounter  Medications   DISCONTD: celecoxib (CELEBREX) 100 MG capsule    Sig: Take 1 capsule (100 mg total) by mouth 2 (two) times daily.    Dispense:  30 capsule    Refill:  2   spironolactone (ALDACTONE) 25 MG tablet    Sig: Take 1 tablet (25 mg total) by mouth 2 (two) times daily.    Dispense:  180 tablet    Refill:  1   pantoprazole (PROTONIX) 40 MG tablet    Sig: Take 1 tablet (40 mg total) by mouth daily.    Dispense:  90 tablet    Refill:  1   montelukast (SINGULAIR) 10 MG tablet    Sig: Take 1 tablet (10 mg total) by mouth at bedtime.    Dispense:  90 tablet    Refill:  1   LORazepam (ATIVAN) 0.5 MG tablet    Sig: Take one tab po qd as needed for anxiety    Dispense:  30 tablet    Refill:  1   celecoxib (CELEBREX) 100 MG capsule    Sig: Take 1 capsule (100 mg total) by mouth 2 (two) times daily.    Dispense:  180 capsule    Refill:  1    This patient was seen by Drema Dallas, PA-C in collaboration with Dr. Clayborn Bigness as a part of collaborative care agreement.   Total time spent:30 Minutes Time spent includes review of chart, medications, test results, and follow up plan with the patient.      Dr Lavera Guise Internal medicine

## 2022-03-06 MED ORDER — CELECOXIB 100 MG PO CAPS: 100.0000 mg | ORAL_CAPSULE | Freq: Two times a day (BID) | ORAL | 1 refills | Status: DC

## 2022-03-09 ENCOUNTER — Encounter: Payer: Self-pay | Admitting: Nurse Practitioner

## 2022-04-15 ENCOUNTER — Telehealth: Payer: Self-pay | Admitting: Urology

## 2022-04-15 NOTE — Telephone Encounter (Signed)
DOS - 05/08/22  EXC GANGLION RIGHT --- 14840 TARSAL EXOSTECTOMY RIGHT --- 39795  [employer] EFFECTIVE DATE - 09/14/21  PLAN DEDUCTIBLE - $0.00 OUT OF POCKET - $3,400.00 W/ $3,216.08 REMAINING COINSURANCE -0% COPAY - $295.00   PER [employer] SITE FOR CPT CODES 36922 AND 30097 NO PRIOR AUTH IS REQUIRED.

## 2022-04-22 ENCOUNTER — Ambulatory Visit: Payer: Medicare HMO | Admitting: Nurse Practitioner

## 2022-05-06 ENCOUNTER — Other Ambulatory Visit: Payer: Self-pay | Admitting: Nurse Practitioner

## 2022-05-06 DIAGNOSIS — L659 Nonscarring hair loss, unspecified: Secondary | ICD-10-CM

## 2022-05-06 DIAGNOSIS — E782 Mixed hyperlipidemia: Secondary | ICD-10-CM

## 2022-05-06 DIAGNOSIS — K219 Gastro-esophageal reflux disease without esophagitis: Secondary | ICD-10-CM

## 2022-05-06 MED ORDER — PANTOPRAZOLE SODIUM 40 MG PO TBEC: 40.0000 mg | DELAYED_RELEASE_TABLET | Freq: Every day | ORAL | 1 refills | Status: DC

## 2022-05-06 MED ORDER — CELECOXIB 100 MG PO CAPS: 100.0000 mg | ORAL_CAPSULE | Freq: Two times a day (BID) | ORAL | 1 refills | Status: DC

## 2022-05-07 ENCOUNTER — Other Ambulatory Visit: Payer: Self-pay | Admitting: Podiatry

## 2022-05-07 MED ORDER — OXYCODONE-ACETAMINOPHEN 10-325 MG PO TABS: 1.0000 | ORAL_TABLET | Freq: Three times a day (TID) | ORAL | 0 refills | Status: AC | PRN

## 2022-05-07 MED ORDER — CEPHALEXIN 500 MG PO CAPS: 500.0000 mg | ORAL_CAPSULE | Freq: Three times a day (TID) | ORAL | 0 refills | Status: DC

## 2022-05-07 MED ORDER — ONDANSETRON HCL 4 MG PO TABS: 4.0000 mg | ORAL_TABLET | Freq: Three times a day (TID) | ORAL | 0 refills | Status: DC | PRN

## 2022-05-08 ENCOUNTER — Encounter: Payer: Self-pay | Admitting: Podiatry

## 2022-05-08 DIAGNOSIS — M19071 Primary osteoarthritis, right ankle and foot: Secondary | ICD-10-CM | POA: Diagnosis not present

## 2022-05-08 DIAGNOSIS — M25774 Osteophyte, right foot: Secondary | ICD-10-CM | POA: Diagnosis not present

## 2022-05-08 DIAGNOSIS — M898X7 Other specified disorders of bone, ankle and foot: Secondary | ICD-10-CM | POA: Diagnosis not present

## 2022-05-08 DIAGNOSIS — M67471 Ganglion, right ankle and foot: Secondary | ICD-10-CM | POA: Diagnosis not present

## 2022-05-13 ENCOUNTER — Encounter: Payer: Self-pay | Admitting: Podiatry

## 2022-05-13 ENCOUNTER — Ambulatory Visit (INDEPENDENT_AMBULATORY_CARE_PROVIDER_SITE_OTHER): Payer: Medicare HMO | Admitting: Podiatry

## 2022-05-13 ENCOUNTER — Ambulatory Visit (INDEPENDENT_AMBULATORY_CARE_PROVIDER_SITE_OTHER): Payer: Medicare HMO

## 2022-05-13 DIAGNOSIS — Z9889 Other specified postprocedural states: Secondary | ICD-10-CM

## 2022-05-13 DIAGNOSIS — M898X7 Other specified disorders of bone, ankle and foot: Secondary | ICD-10-CM | POA: Diagnosis not present

## 2022-05-13 DIAGNOSIS — M67471 Ganglion, right ankle and foot: Secondary | ICD-10-CM

## 2022-05-13 NOTE — Progress Notes (Signed)
She presents today for her first postop visit with her husband date of surgery 05/08/2022 dorsal tarsal exostectomy and excision ganglion cyst right foot.  She states that it really has not bothered me much at all after the first 36 hours.  Objective: Vital signs are stable she is alert and oriented x3 she denies fever chills nausea vomiting muscle aches pains calf pain back pain chest pain shortness of breath.  Dressing was intact once removed demonstrates sutures are tracked across the dorsal aspect of the foot radiographs demonstrating decreased reduction of the dorsal spurring.  Minimal soft tissue edema.  Assessment well-healing surgical foot.  Plan: I am going to redress today dressed a compressive dressing continue to keep it dry elevated stay off it is much as possible placed her in a Darco shoe follow-up in 1 to 2 weeks for suture removal.

## 2022-05-25 ENCOUNTER — Ambulatory Visit (INDEPENDENT_AMBULATORY_CARE_PROVIDER_SITE_OTHER): Payer: Medicare HMO | Admitting: Podiatry

## 2022-05-25 ENCOUNTER — Encounter: Payer: Self-pay | Admitting: Podiatry

## 2022-05-25 DIAGNOSIS — Z9889 Other specified postprocedural states: Secondary | ICD-10-CM

## 2022-05-25 DIAGNOSIS — M67471 Ganglion, right ankle and foot: Secondary | ICD-10-CM

## 2022-05-25 DIAGNOSIS — M898X7 Other specified disorders of bone, ankle and foot: Secondary | ICD-10-CM

## 2022-05-25 NOTE — Progress Notes (Signed)
She presents today for follow-up of her dorsal tarsal exostectomy and excision ganglion cyst right foot.  She denies fever chills nausea vomit muscle aches and pains states that seems to be doing pretty well.  Objective: Vital signs are stable alert oriented times 3 sutures are intact margins well coapted there is no erythema edema salines drainage or odor remove the sutures today margins remain well coapted.  Assessment: Well-healing surgical foot.  Plan: Redressed the foot with some compression today she may undress it and put a compression anklet and I did place her in a Darco shoe would like to follow-up with her in 2 to 3 weeks make sure she is doing well and consider discharge.

## 2022-06-08 ENCOUNTER — Encounter: Payer: Medicare HMO | Admitting: Podiatry

## 2022-06-16 ENCOUNTER — Encounter: Payer: Self-pay | Admitting: Nurse Practitioner

## 2022-06-16 ENCOUNTER — Ambulatory Visit (INDEPENDENT_AMBULATORY_CARE_PROVIDER_SITE_OTHER): Payer: Medicare HMO | Admitting: Nurse Practitioner

## 2022-06-16 VITALS — BP 124/70 | HR 97 | Temp 98.4°F | Resp 16 | Ht 63.0 in | Wt 168.6 lb

## 2022-06-16 DIAGNOSIS — F411 Generalized anxiety disorder: Secondary | ICD-10-CM | POA: Diagnosis not present

## 2022-06-16 DIAGNOSIS — N3001 Acute cystitis with hematuria: Secondary | ICD-10-CM

## 2022-06-16 DIAGNOSIS — R300 Dysuria: Secondary | ICD-10-CM | POA: Diagnosis not present

## 2022-06-16 DIAGNOSIS — J0141 Acute recurrent pansinusitis: Secondary | ICD-10-CM | POA: Diagnosis not present

## 2022-06-16 LAB — POCT URINALYSIS DIPSTICK
Glucose, UA: NEGATIVE
Ketones, UA: NEGATIVE
Nitrite, UA: POSITIVE
Protein, UA: NEGATIVE
Spec Grav, UA: 1.025 (ref 1.010–1.025)
Urobilinogen, UA: 0.2 E.U./dL
pH, UA: 6.5 (ref 5.0–8.0)

## 2022-06-16 MED ORDER — FLUTICASONE PROPIONATE 50 MCG/ACT NA SUSP: 2.0000 | Freq: Every day | NASAL | 6 refills | Status: DC

## 2022-06-16 MED ORDER — FLUCONAZOLE 150 MG PO TABS: 150.0000 mg | ORAL_TABLET | Freq: Once | ORAL | 0 refills | Status: AC

## 2022-06-16 MED ORDER — DOXYCYCLINE HYCLATE 100 MG PO TABS: 100.0000 mg | ORAL_TABLET | Freq: Two times a day (BID) | ORAL | 0 refills | Status: DC

## 2022-06-16 MED ORDER — LORAZEPAM 0.5 MG PO TABS: ORAL_TABLET | ORAL | 1 refills | Status: DC

## 2022-06-16 NOTE — Progress Notes (Signed)
[facility] [place],  [id number]  Internal MEDICINE  Office Visit Note  Patient Name: Paula [name]  [id number]  [id number]  Date of Service: 06/16/2022  Chief Complaint  Patient presents with   watery eyes   Sore Throat   Ear Pain   head pressure   Urinary Tract Infection    Odor, pressure ,frequency for few days     HPI Paula [name] presents for an acute sick visit for possible URI and UTI. URI Sx --having sore throat, ear pain/pressure, sinus pressure, watery eyes, nasal congestion, runny nose, red eyes, sinus drainage, headache and dizziness.  Denies fever, chills, fatigue, body aches, cough, shortness of breath, wheezing, and chest tightness. UTI Sx --having increased urinary urgency and frequency, pain and discomfort with urination, suprapubic pressure and an odor to her urine.  Onset was a few days ago Allergic rhinitis --also has chronic allergic rhinitis due to unknown triggers but most likely environmental allergies.  Takes allergy medication, tried her son's OTC Flonase nasal spray and wants to start using this regularly, needs prescription Requests lorazepam refill    Current Medication:  Outpatient Encounter Medications as of 06/16/2022  Medication Sig   doxycycline (VIBRA-TABS) 100 MG tablet Take 1 tablet (100 mg total) by mouth 2 (two) times daily.   fexofenadine-pseudoephedrine (ALLEGRA-D 24) 180-240 MG 24 hr tablet Take 1 tablet by mouth daily.   fluconazole (DIFLUCAN) 150 MG tablet Take 1 tablet (150 mg total) by mouth once for 1 dose. May take an additional dose after 3 days if still symptomatic.   fluticasone (FLONASE) 50 MCG/ACT nasal spray Place 2 sprays into both nostrils daily.   montelukast (SINGULAIR) 10 MG tablet Take 1 tablet (10 mg total) by mouth at bedtime.   pantoprazole (PROTONIX) 40 MG tablet Take 1 tablet (40 mg total) by mouth daily.   rosuvastatin (CRESTOR) 5 MG tablet TAKE 1 TABLET BY MOUTH TWICE A WEEK    spironolactone (ALDACTONE) 25 MG tablet TAKE 1 TABLET BY MOUTH 2 TIMES DAILY   celecoxib (CELEBREX) 100 MG capsule Take 1 capsule (100 mg total) by mouth 2 (two) times daily.   erythromycin ophthalmic ointment 3 (three) times daily.   LORazepam (ATIVAN) 0.5 MG tablet Take one tab po qd as needed for anxiety   [DISCONTINUED] azelastine (ASTELIN) 0.1 % nasal spray Place 2 sprays into both nostrils 2 (two) times daily. (Patient not taking: Reported on 06/16/2022)   [DISCONTINUED] cephALEXin (KEFLEX) 500 MG capsule Take 1 capsule (500 mg total) by mouth 3 (three) times daily. (Patient not taking: Reported on 06/16/2022)   [DISCONTINUED] chlorpheniramine-HYDROcodone (TUSSIONEX PENNKINETIC ER) 10-8 MG/5ML SUER Take 5 mLs by mouth every 12 (twelve) hours as needed for cough. (Patient not taking: Reported on 06/16/2022)   [DISCONTINUED] cyclobenzaprine (FLEXERIL) 10 MG tablet Take 1 tablet (10 mg total) by mouth at bedtime. Take one tab po qhs for back spasm prn only (Patient not taking: Reported on 06/16/2022)   [DISCONTINUED] fluticasone (FLONASE) 50 MCG/ACT nasal spray Place 2 sprays into both nostrils daily.   [DISCONTINUED] ibuprofen (ADVIL) 600 MG tablet Take 1 tablet (600 mg total) by mouth every 6 (six) hours as needed. (Patient not taking: Reported on 06/16/2022)   [DISCONTINUED] LORazepam (ATIVAN) 0.5 MG tablet Take one tab po qd as needed for anxiety   [DISCONTINUED] mometasone (NASONEX) 50 MCG/ACT nasal spray Place 2 sprays into the nose daily. (Patient not taking: Reported on 06/16/2022)   [DISCONTINUED] ondansetron (ZOFRAN) 4 MG tablet Take 1 tablet (  4 mg total) by mouth every 8 (eight) hours as needed. (Patient not taking: Reported on 06/16/2022)   No facility-administered encounter medications on file as of 06/16/2022.      Medical History: Past Medical History:  Diagnosis Date   Asthma    Environmental allergies    GERD (gastroesophageal reflux disease)    Hypertension    Migraine     Sinusitis      Vital Signs: BP 124/70   Pulse 97   Temp 98.4 F (36.9 C)   Resp 16   Ht '5\' 3"'$  (1.6 m)   Wt 168 lb 9.6 oz (76.5 kg)   SpO2 97%   BMI 29.87 kg/m    Review of Systems  Constitutional:  Negative for chills, fatigue and fever.  HENT:  Positive for congestion, ear pain, postnasal drip, sinus pressure, sinus pain and sore throat.   Eyes:  Positive for photophobia, discharge and redness. Negative for pain (pressure, not pain).  Respiratory: Negative.  Negative for cough, chest tightness, shortness of breath and wheezing.   Cardiovascular: Negative.  Negative for chest pain and palpitations.  Gastrointestinal:  Negative for diarrhea, nausea and vomiting.  Genitourinary:  Positive for difficulty urinating, dysuria, frequency and urgency. Negative for flank pain.  Musculoskeletal:  Negative for myalgias.  Neurological:  Positive for dizziness and headaches.    Physical Exam Vitals reviewed.  Constitutional:      General: She is not in acute distress.    Appearance: Normal appearance. She is well-developed. She is ill-appearing.  HENT:     Head: Normocephalic and atraumatic.     Right Ear: External ear normal. Swelling and tenderness present. No middle ear effusion.     Left Ear: External ear normal. Swelling and tenderness present.  No middle ear effusion.     Nose: Nasal tenderness, mucosal edema, congestion and rhinorrhea present. Rhinorrhea is clear.     Right Turbinates: Enlarged and pale.     Left Turbinates: Enlarged and pale.     Right Sinus: Maxillary sinus tenderness and frontal sinus tenderness present.     Left Sinus: Maxillary sinus tenderness and frontal sinus tenderness present.     Mouth/Throat:     Mouth: Mucous membranes are moist.     Pharynx: Oropharyngeal exudate and posterior oropharyngeal erythema present.  Eyes:     Pupils: Pupils are equal, round, and reactive to light.  Cardiovascular:     Rate and Rhythm: Normal rate and regular rhythm.   Pulmonary:     Effort: Pulmonary effort is normal. No respiratory distress.     Breath sounds: Normal breath sounds. No wheezing.  Neurological:     Mental Status: She is alert.  Psychiatric:        Behavior: Behavior is cooperative.       Assessment/Plan: 1. Acute recurrent pansinusitis Empiric antibiotic treatment prescribed, allergy medications changes as well.  - fexofenadine-pseudoephedrine (ALLEGRA-D 24) 180-240 MG 24 hr tablet; Take 1 tablet by mouth daily. - doxycycline (VIBRA-TABS) 100 MG tablet; Take 1 tablet (100 mg total) by mouth 2 (two) times daily.  Dispense: 20 tablet; Refill: 0 - fluticasone (FLONASE) 50 MCG/ACT nasal spray; Place 2 sprays into both nostrils daily.  Dispense: 16 g; Refill: 6  2. Acute cystitis with hematuria Doxycycline prescribed to treat URI and UTI, fluconazole prescribed to treat possible yeast infection.  - doxycycline (VIBRA-TABS) 100 MG tablet; Take 1 tablet (100 mg total) by mouth 2 (two) times daily.  Dispense: 20 tablet; Refill: 0 -  fluconazole (DIFLUCAN) 150 MG tablet; Take 1 tablet (150 mg total) by mouth once for 1 dose. May take an additional dose after 3 days if still symptomatic.  Dispense: 3 tablet; Refill: 0  3. Dysuria Urine positive for leukocytes, blood and nitrites, urine sent for culture.  - POCT Urinalysis Dipstick - CULTURE, URINE COMPREHENSIVE  4. Generalized anxiety disorder Continue lorazepam as prescribed, follow up in 2 months for refills - LORazepam (ATIVAN) 0.5 MG tablet; Take one tab po qd as needed for anxiety  Dispense: 30 tablet; Refill: 1   General Counseling: Paula [name] verbalizes understanding of the findings of todays visit and agrees with plan of treatment. I have discussed any further diagnostic evaluation that may be needed or ordered today. We also reviewed her medications today. she has been encouraged to call the office with any questions or concerns that should arise related to todays  visit.    Counseling:    Orders Placed This Encounter  Procedures   CULTURE, URINE COMPREHENSIVE   POCT Urinalysis Dipstick    Meds ordered this encounter  Medications   doxycycline (VIBRA-TABS) 100 MG tablet    Sig: Take 1 tablet (100 mg total) by mouth 2 (two) times daily.    Dispense:  20 tablet    Refill:  0   fluconazole (DIFLUCAN) 150 MG tablet    Sig: Take 1 tablet (150 mg total) by mouth once for 1 dose. May take an additional dose after 3 days if still symptomatic.    Dispense:  3 tablet    Refill:  0   fluticasone (FLONASE) 50 MCG/ACT nasal spray    Sig: Place 2 sprays into both nostrils daily.    Dispense:  16 g    Refill:  6   LORazepam (ATIVAN) 0.5 MG tablet    Sig: Take one tab po qd as needed for anxiety    Dispense:  30 tablet    Refill:  1    Return for previously scheduled, CPE, Brenly Trawick PCP in february.  [place] Controlled Substance Database was reviewed by me for overdose risk score (ORS)  Time spent:30 Minutes Time spent with patient included reviewing progress notes, labs, imaging studies, and discussing plan for follow up.   This patient was seen by Jonetta Osgood, FNP-C in collaboration with Dr. Clayborn Bigness as a part of collaborative care agreement.  Andre Swander R. Valetta Fuller, MSN, FNP-C Internal Medicine

## 2022-06-17 ENCOUNTER — Ambulatory Visit (INDEPENDENT_AMBULATORY_CARE_PROVIDER_SITE_OTHER): Payer: Medicare HMO | Admitting: Podiatry

## 2022-06-17 ENCOUNTER — Encounter: Payer: Self-pay | Admitting: Podiatry

## 2022-06-17 DIAGNOSIS — Z9889 Other specified postprocedural states: Secondary | ICD-10-CM

## 2022-06-17 DIAGNOSIS — M898X7 Other specified disorders of bone, ankle and foot: Secondary | ICD-10-CM

## 2022-06-17 DIAGNOSIS — M67471 Ganglion, right ankle and foot: Secondary | ICD-10-CM

## 2022-06-17 NOTE — Progress Notes (Signed)
She presents today date of surgery 05/08/2022 dorsal tarsal exostectomy and excision ganglion cyst right foot.  She states this good is a little tender the numbness to the toes and the shooting pain to the toe has resolved greatly as she refers to the right foot.  She states that they are about to leave for [place].  Objective: Vital signs are stable alert and oriented x3.  Pulses are palpable.  She has a little swelling at the small incision overlying the dorsal aspect of the right foot.  There is no tenderness on palpation no erythema cellulitis drainage or odor.  Assessment: Well-healing surgical foot.  Plan: I instructed her to continue to utilize her vitamin E oil and massage the foot on a daily basis to help reduce the swelling the inflammation and the scar tissue.  She understands this is amenable to it we will follow-up with me on an as-needed basis.

## 2022-06-19 LAB — CULTURE, URINE COMPREHENSIVE

## 2022-06-22 ENCOUNTER — Encounter: Payer: Medicare HMO | Admitting: Podiatry

## 2022-06-22 ENCOUNTER — Other Ambulatory Visit: Payer: Self-pay

## 2022-06-22 ENCOUNTER — Telehealth: Payer: Self-pay

## 2022-06-22 MED ORDER — OFLOXACIN 0.3 % OP SOLN: 1.0000 [drp] | Freq: Four times a day (QID) | OPHTHALMIC | 0 refills | Status: DC

## 2022-06-22 NOTE — Telephone Encounter (Signed)
Pt called that she had watery eye and red and Covid test is negative as per alyssa advised her to hold for allegra and Singulair and take Claritin 10 mg for 3 days and also we send ofloxacin eye drops

## 2022-06-24 DIAGNOSIS — M3501 Sicca syndrome with keratoconjunctivitis: Secondary | ICD-10-CM | POA: Diagnosis not present

## 2022-07-03 ENCOUNTER — Other Ambulatory Visit: Payer: Self-pay

## 2022-07-03 ENCOUNTER — Telehealth: Payer: Self-pay

## 2022-07-03 MED ORDER — NITROFURANTOIN MONOHYD MACRO 100 MG PO CAPS: 100.0000 mg | ORAL_CAPSULE | Freq: Two times a day (BID) | ORAL | 0 refills | Status: DC

## 2022-07-03 NOTE — Telephone Encounter (Signed)
Pt called that she still having UTI symptoms as per dr send macrobid for 5 days  and advised if she is not feeling better need appt and also follow up with eye dr for her eye

## 2022-07-07 DIAGNOSIS — H04563 Stenosis of bilateral lacrimal punctum: Secondary | ICD-10-CM | POA: Diagnosis not present

## 2022-07-07 DIAGNOSIS — H04202 Unspecified epiphora, left lacrimal gland: Secondary | ICD-10-CM | POA: Diagnosis not present

## 2022-07-27 DIAGNOSIS — H04202 Unspecified epiphora, left lacrimal gland: Secondary | ICD-10-CM | POA: Diagnosis not present

## 2022-07-27 DIAGNOSIS — H52223 Regular astigmatism, bilateral: Secondary | ICD-10-CM | POA: Diagnosis not present

## 2022-07-30 ENCOUNTER — Other Ambulatory Visit: Payer: Self-pay

## 2022-07-30 DIAGNOSIS — J45991 Cough variant asthma: Secondary | ICD-10-CM

## 2022-07-30 MED ORDER — MONTELUKAST SODIUM 10 MG PO TABS: 10.0000 mg | ORAL_TABLET | Freq: Every day | ORAL | 1 refills | Status: DC

## 2022-08-03 ENCOUNTER — Other Ambulatory Visit: Payer: Self-pay

## 2022-08-03 ENCOUNTER — Telehealth: Payer: Self-pay

## 2022-08-03 MED ORDER — CIPROFLOXACIN HCL 500 MG PO TABS: 500.0000 mg | ORAL_TABLET | Freq: Two times a day (BID) | ORAL | 0 refills | Status: DC

## 2022-08-03 NOTE — Telephone Encounter (Signed)
Pt called that Uti advised as per dr send cipro  for 10 days need a follow up appt to repeat UA

## 2022-08-10 ENCOUNTER — Ambulatory Visit (INDEPENDENT_AMBULATORY_CARE_PROVIDER_SITE_OTHER): Payer: Medicare HMO

## 2022-08-10 ENCOUNTER — Ambulatory Visit: Payer: Medicare HMO | Admitting: Podiatry

## 2022-08-10 ENCOUNTER — Encounter: Payer: Self-pay | Admitting: Podiatry

## 2022-08-10 DIAGNOSIS — M722 Plantar fascial fibromatosis: Secondary | ICD-10-CM | POA: Diagnosis not present

## 2022-08-10 MED ORDER — MELOXICAM 15 MG PO TABS: 15.0000 mg | ORAL_TABLET | Freq: Every day | ORAL | 0 refills | Status: DC | PRN

## 2022-08-10 NOTE — Progress Notes (Signed)
  Subjective:  Patient ID: Paula [name], female    DOB: [birth year],  MRN: [id number]  Chief Complaint  Patient presents with   Foot Pain    left foot/heel pain from stepping on rock barefooted    66 y.o. female presents with the above complaint. History confirmed with patient.  She had similar pain back in February and she saw Dr. Milinda Pointer for Planter fasciitis and he did an injection put her on prednisone and meloxicam and this helped eliminate it.  Was doing very well until a few weeks ago when she stepped on a rock and it has been hurting ever since then and has not gone away.  Objective:  Physical Exam: warm, good capillary refill, no trophic changes or ulcerative lesions, normal DP and PT pulses, normal sensory exam, and pain and tenderness to insertion of plantar fascia on medial calcaneus.   Radiographs: Multiple views x-ray of the left foot: no fracture, dislocation, swelling or degenerative changes noted, plantar calcaneal spur, and posterior calcaneal spur Assessment:   1. Plantar fasciitis of left foot      Plan:  Patient was evaluated and treated and all questions answered.  Discussed the etiology and treatment options for plantar fasciitis including stretching, formal physical therapy, supportive shoegears such as a running shoe or sneaker, pre fabricated orthoses, injection therapy, and oral medications. We also discussed the role of surgical treatment of this for patients who do not improve after exhausting non-surgical treatment options.   -XR reviewed with patient -Educated patient on stretching and icing of the affected limb -Injection delivered to the plantar fascia of the left foot. -Rx for meloxicam. Educated on use, risks and benefits of the medication  After sterile prep with povidone-iodine solution and alcohol, the left heel was injected with 0.5cc 2% xylocaine plain, 0.5cc 0.5% marcaine plain, '5mg'$  triamcinolone acetonide, and '2mg'$  dexamethasone was injected  along the medial plantar fascia at the insertion on the plantar calcaneus. The patient tolerated the procedure well without complication.  Return if symptoms worsen or fail to improve.

## 2022-08-10 NOTE — Patient Instructions (Signed)

## 2022-09-03 DIAGNOSIS — H04202 Unspecified epiphora, left lacrimal gland: Secondary | ICD-10-CM | POA: Diagnosis not present

## 2022-09-15 DIAGNOSIS — Z0100 Encounter for examination of eyes and vision without abnormal findings: Secondary | ICD-10-CM | POA: Diagnosis not present

## 2022-10-07 ENCOUNTER — Ambulatory Visit: Payer: Medicare HMO | Admitting: Nurse Practitioner

## 2022-10-14 ENCOUNTER — Telehealth: Payer: Self-pay | Admitting: Nurse Practitioner

## 2022-10-14 DIAGNOSIS — Z0000 Encounter for general adult medical examination without abnormal findings: Secondary | ICD-10-CM

## 2022-10-14 DIAGNOSIS — L659 Nonscarring hair loss, unspecified: Secondary | ICD-10-CM

## 2022-10-14 DIAGNOSIS — E782 Mixed hyperlipidemia: Secondary | ICD-10-CM

## 2022-10-14 DIAGNOSIS — E538 Deficiency of other specified B group vitamins: Secondary | ICD-10-CM

## 2022-10-14 DIAGNOSIS — E559 Vitamin D deficiency, unspecified: Secondary | ICD-10-CM

## 2022-10-16 NOTE — Telephone Encounter (Signed)
Paula [name] will call pt that lab ordered

## 2022-10-20 DIAGNOSIS — E559 Vitamin D deficiency, unspecified: Secondary | ICD-10-CM | POA: Diagnosis not present

## 2022-10-20 DIAGNOSIS — E538 Deficiency of other specified B group vitamins: Secondary | ICD-10-CM | POA: Diagnosis not present

## 2022-10-20 DIAGNOSIS — L659 Nonscarring hair loss, unspecified: Secondary | ICD-10-CM | POA: Diagnosis not present

## 2022-10-20 DIAGNOSIS — Z0000 Encounter for general adult medical examination without abnormal findings: Secondary | ICD-10-CM | POA: Diagnosis not present

## 2022-10-20 DIAGNOSIS — E782 Mixed hyperlipidemia: Secondary | ICD-10-CM | POA: Diagnosis not present

## 2022-10-21 ENCOUNTER — Ambulatory Visit: Payer: Medicare HMO | Admitting: Nurse Practitioner

## 2022-10-21 LAB — CBC WITH DIFFERENTIAL/PLATELET
Basophils Absolute: 0.1 10*3/uL (ref 0.0–0.2)
Basos: 2 %
EOS (ABSOLUTE): 0.2 10*3/uL (ref 0.0–0.4)
Eos: 6 %
Hematocrit: 35.5 % (ref 34.0–46.6)
Hemoglobin: 11.8 g/dL (ref 11.1–15.9)
Immature Grans (Abs): 0 10*3/uL (ref 0.0–0.1)
Immature Granulocytes: 0 %
Lymphocytes Absolute: 1.3 10*3/uL (ref 0.7–3.1)
Lymphs: 42 %
MCH: 31 pg (ref 26.6–33.0)
MCHC: 33.2 g/dL (ref 31.5–35.7)
MCV: 93 fL (ref 79–97)
Monocytes Absolute: 0.4 10*3/uL (ref 0.1–0.9)
Monocytes: 13 %
Neutrophils Absolute: 1.2 10*3/uL — ABNORMAL LOW (ref 1.4–7.0)
Neutrophils: 37 %
Platelets: 219 10*3/uL (ref 150–450)
RBC: 3.81 x10E6/uL (ref 3.77–5.28)
RDW: 12.5 % (ref 11.7–15.4)
WBC: 3.2 10*3/uL — ABNORMAL LOW (ref 3.4–10.8)

## 2022-10-21 LAB — LIPID PANEL
Chol/HDL Ratio: 2 ratio (ref 0.0–4.4)
Cholesterol, Total: 166 mg/dL (ref 100–199)
HDL: 82 mg/dL (ref >39)
LDL Chol Calc (NIH): 75 mg/dL (ref 0–99)
Triglycerides: 42 mg/dL (ref 0–149)
VLDL Cholesterol Cal: 9 mg/dL (ref 5–40)

## 2022-10-21 LAB — CMP14+EGFR
ALT: 17 IU/L (ref 0–32)
AST: 21 IU/L (ref 0–40)
Albumin/Globulin Ratio: 2.1 (ref 1.2–2.2)
Albumin: 4.2 g/dL (ref 3.9–4.9)
Alkaline Phosphatase: 55 IU/L (ref 44–121)
BUN/Creatinine Ratio: 15 (ref 12–28)
BUN: 13 mg/dL (ref 8–27)
Bilirubin Total: 0.3 mg/dL (ref 0.0–1.2)
CO2: 24 mmol/L (ref 20–29)
Calcium: 9 mg/dL (ref 8.7–10.3)
Chloride: 107 mmol/L — ABNORMAL HIGH (ref 96–106)
Creatinine, Ser: 0.85 mg/dL (ref 0.57–1.00)
Globulin, Total: 2 g/dL (ref 1.5–4.5)
Glucose: 101 mg/dL — ABNORMAL HIGH (ref 70–99)
Potassium: 4.2 mmol/L (ref 3.5–5.2)
Sodium: 143 mmol/L (ref 134–144)
Total Protein: 6.2 g/dL (ref 6.0–8.5)
eGFR: 75 mL/min/{1.73_m2} (ref >59)

## 2022-10-21 LAB — TSH+FREE T4
Free T4: 1.17 ng/dL (ref 0.82–1.77)
TSH: 2.43 u[IU]/mL (ref 0.450–4.500)

## 2022-10-21 LAB — VITAMIN D 25 HYDROXY (VIT D DEFICIENCY, FRACTURES): Vit D, 25-Hydroxy: 49.4 ng/mL (ref 30.0–100.0)

## 2022-10-21 LAB — B12 AND FOLATE PANEL
Folate: 8.8 ng/mL (ref >3.0)
Vitamin B-12: 655 pg/mL (ref 232–1245)

## 2022-10-22 ENCOUNTER — Encounter: Payer: Self-pay | Admitting: Nurse Practitioner

## 2022-10-22 ENCOUNTER — Ambulatory Visit (INDEPENDENT_AMBULATORY_CARE_PROVIDER_SITE_OTHER): Payer: Medicare HMO | Admitting: Nurse Practitioner

## 2022-10-22 VITALS — BP 140/80 | HR 70 | Temp 97.4°F | Resp 16 | Ht 63.0 in | Wt 168.0 lb

## 2022-10-22 DIAGNOSIS — Z0001 Encounter for general adult medical examination with abnormal findings: Secondary | ICD-10-CM | POA: Diagnosis not present

## 2022-10-22 DIAGNOSIS — R300 Dysuria: Secondary | ICD-10-CM | POA: Diagnosis not present

## 2022-10-22 DIAGNOSIS — F411 Generalized anxiety disorder: Secondary | ICD-10-CM | POA: Diagnosis not present

## 2022-10-22 DIAGNOSIS — Z1231 Encounter for screening mammogram for malignant neoplasm of breast: Secondary | ICD-10-CM

## 2022-10-22 MED ORDER — LORAZEPAM 0.5 MG PO TABS: ORAL_TABLET | ORAL | 1 refills | Status: DC

## 2022-10-22 NOTE — Progress Notes (Signed)
[facility] [place], [place] [address]  Internal MEDICINE  Office Visit Note  Patient Name: Paula [name]  [id number]  [id number]  Date of Service: 10/22/2022  Chief Complaint  Patient presents with   Medicare Wellness   Hypertension   Gastroesophageal Reflux    HPI Paula [name] presents for an annual well visit and physical exam.  Well-appearing 67 y.o. female with hypertension, GERD, allergic rhinitis, recurrent sinusitis,  Routine CRC screening: due in 2026 Routine mammogram: due now DEXA scan: postpone for now  Labs: discussed labs  Low WBC, the rest of her labs are grossly normal Normal cholesterol New or worsening pain: none  Other concerns: none      10/22/2022   11:06 AM 09/25/2019    9:10 AM  MMSE - Mini Mental State Exam  Orientation to time 5 5  Orientation to Place 5 5  Registration 3 3  Attention/ Calculation 5 5  Recall 3 3  Language- name 2 objects 2 2  Language- repeat 1 1  Language- follow 3 step command 3 3  Language- read & follow direction 1 1  Write a sentence 1 1  Copy design 1 1  Total score 30 30    Functional Status Survey: Is the patient deaf or have difficulty hearing?: No Does the patient have difficulty seeing, even when wearing glasses/contacts?: No Does the patient have difficulty concentrating, remembering, or making decisions?: No Does the patient have difficulty walking or climbing stairs?: No Does the patient have difficulty dressing or bathing?: No Does the patient have difficulty doing errands alone such as visiting a doctor's office or shopping?: No     04/21/2021   11:24 AM 05/20/2021    1:23 PM 10/15/2021   10:17 AM 03/05/2022   11:45 AM 10/22/2022   11:05 AM  [place] in the past year? 0 1 0 0 0  Was there an injury with Fall?  1   0  Fall Risk Category Calculator  2   0  Fall Risk Category (Retired)  Moderate     (RETIRED) Patient Fall Risk Level Low fall risk Low fall risk Low fall risk Low  fall risk   Patient at Risk for Falls Due to No Fall Risks History of fall(s) No Fall Risks No Fall Risks No Fall Risks  Fall risk Follow up Falls evaluation completed Falls evaluation completed Falls evaluation completed Falls evaluation completed Falls evaluation completed       10/22/2022   11:05 AM  Depression screen PHQ 2/9  Decreased Interest 0  Down, Depressed, Hopeless 0  PHQ - 2 Score 0        Current Medication: Outpatient Encounter Medications as of 10/22/2022  Medication Sig   celecoxib (CELEBREX) 100 MG capsule Take 1 capsule (100 mg total) by mouth 2 (two) times daily.   ciprofloxacin (CIPRO) 500 MG tablet Take 1 tablet (500 mg total) by mouth 2 (two) times daily.   erythromycin ophthalmic ointment 3 (three) times daily.   fexofenadine-pseudoephedrine (ALLEGRA-D 24) 180-240 MG 24 hr tablet Take 1 tablet by mouth daily.   fluticasone (FLONASE) 50 MCG/ACT nasal spray Place 2 sprays into both nostrils daily.   meloxicam (MOBIC) 15 MG tablet Take 1 tablet (15 mg total) by mouth daily as needed for pain (foot pain).   montelukast (SINGULAIR) 10 MG tablet Take 1 tablet (10 mg total) by mouth at bedtime.   ofloxacin (OCUFLOX) 0.3 % ophthalmic solution Place 1 drop into both  eyes 4 (four) times daily. For 5 days   pantoprazole (PROTONIX) 40 MG tablet Take 1 tablet (40 mg total) by mouth daily.   rosuvastatin (CRESTOR) 5 MG tablet TAKE 1 TABLET BY MOUTH TWICE A WEEK   spironolactone (ALDACTONE) 25 MG tablet TAKE 1 TABLET BY MOUTH 2 TIMES DAILY   [DISCONTINUED] LORazepam (ATIVAN) 0.5 MG tablet Take one tab po qd as needed for anxiety   [DISCONTINUED] nitrofurantoin, macrocrystal-monohydrate, (MACROBID) 100 MG capsule Take 1 capsule (100 mg total) by mouth 2 (two) times daily.   LORazepam (ATIVAN) 0.5 MG tablet Take one tab po qd as needed for anxiety   No facility-administered encounter medications on file as of 10/22/2022.    Surgical History: Past Surgical History:  Procedure  Laterality Date   AUGMENTATION MAMMAPLASTY Bilateral [place]   skin cancer   08/11/2016   removal   TUBAL LIGATION  08/11/2016    Medical History: Past Medical History:  Diagnosis Date   Asthma    Environmental allergies    GERD (gastroesophageal reflux disease)    Hypertension    Migraine    Sinusitis     Family History: Family History  Problem Relation Age of Onset   Breast cancer Cousin    COPD Mother    Lung cancer Mother    Hypertension Mother    Ulcers Father    Bowel Disease Father        blockage, sepsis    Social History   Socioeconomic History   Marital status: Married    Spouse name: Not on file   Number of children: Not on file   Years of education: Not on file   Highest education level: Not on file  Occupational History   Not on file  Tobacco Use   Smoking status: Former    Types: Cigarettes    Quit date: 12/01/2004    Years since quitting: 17.9   Smokeless tobacco: Never  Vaping Use   Vaping Use: Never used  Substance and Sexual Activity   Alcohol use: Yes    Comment: ocassionally   Drug use: No   Sexual activity: Not on file  Other Topics Concern   Not on file  Social History Narrative   Not on file   Social Determinants of Health   Financial Resource Strain: Not on file  Food Insecurity: Not on file  Transportation Needs: Not on file  Physical Activity: Not on file  Stress: Not on file  Social Connections: Not on file  Intimate Partner Violence: Not on file      Review of Systems  Constitutional:  Negative for activity change, appetite change, chills, fatigue, fever and unexpected weight change.  HENT: Negative.  Negative for congestion, ear pain, rhinorrhea, sore throat and trouble swallowing.   Eyes: Negative.   Respiratory: Negative.  Negative for cough, chest tightness, shortness of breath and wheezing.   Cardiovascular: Negative.  Negative for chest pain and palpitations.  Gastrointestinal: Negative.   Negative for abdominal pain, blood in stool, constipation, diarrhea, nausea and vomiting.  Endocrine: Negative.   Genitourinary:  Negative for difficulty urinating, dysuria, frequency, hematuria and urgency.  Musculoskeletal:  Positive for arthralgias. Negative for back pain, joint swelling, myalgias and neck pain.       Cyst of right foot and left heel pain  Skin: Negative.  Negative for rash and wound.  Allergic/Immunologic: Negative.  Negative for immunocompromised state.  Neurological: Negative.  Negative for dizziness, seizures, numbness  and headaches.  Hematological: Negative.   Psychiatric/Behavioral: Negative.  Negative for behavioral problems, self-injury, sleep disturbance and suicidal ideas. The patient is not nervous/anxious.     Vital Signs: BP (!) 140/80   Pulse 70   Temp (!) 97.4 F (36.3 C)   Resp 16   Ht 5' 3"$  (1.6 m)   Wt 168 lb (76.2 kg)   SpO2 98%   BMI 29.76 kg/m    Physical Exam Vitals reviewed.  Constitutional:      General: She is awake. She is not in acute distress.    Appearance: Normal appearance. She is well-developed and well-groomed. She is obese. She is not ill-appearing or diaphoretic.  HENT:     Head: Normocephalic and atraumatic.     Right Ear: Tympanic membrane, ear canal and external ear normal.     Left Ear: Tympanic membrane, ear canal and external ear normal.     Nose: Nose normal. No congestion or rhinorrhea.     Mouth/Throat:     Lips: Pink.     Mouth: Mucous membranes are moist.     Pharynx: Oropharynx is clear. Uvula midline. No oropharyngeal exudate or posterior oropharyngeal erythema.  Eyes:     General: Lids are normal. Vision grossly intact. Gaze aligned appropriately. No scleral icterus.       Right eye: No discharge.        Left eye: No discharge.     Extraocular Movements: Extraocular movements intact.     Conjunctiva/sclera: Conjunctivae normal.     Pupils: Pupils are equal, round, and reactive to light.     Funduscopic  exam:    Right eye: Red reflex present.        Left eye: Red reflex present. Neck:     Thyroid: No thyromegaly.     Vascular: No JVD.     Trachea: No tracheal deviation.  Cardiovascular:     Rate and Rhythm: Normal rate and regular rhythm.     Pulses: Normal pulses.     Heart sounds: Normal heart sounds, S1 normal and S2 normal. No murmur heard.    No friction rub. No gallop.  Pulmonary:     Effort: Pulmonary effort is normal. No accessory muscle usage or respiratory distress.     Breath sounds: Normal breath sounds and air entry. No stridor. No wheezing or rales.  Chest:     Chest wall: No tenderness.     Comments: Declined clinical breast exam Abdominal:     General: Bowel sounds are normal. There is no distension.     Palpations: Abdomen is soft. There is no shifting dullness, fluid wave, mass or pulsatile mass.     Tenderness: There is no abdominal tenderness. There is no guarding or rebound.  Musculoskeletal:        General: No tenderness or deformity. Normal range of motion.     Cervical back: Normal range of motion and neck supple.     Right lower leg: No edema.     Left lower leg: No edema.  Lymphadenopathy:     Cervical: No cervical adenopathy.  Skin:    General: Skin is warm and dry.     Capillary Refill: Capillary refill takes less than 2 seconds.     Coloration: Skin is not pale.     Findings: No erythema or rash.  Neurological:     Mental Status: She is alert and oriented to person, place, and time.     Cranial Nerves: No cranial nerve  deficit.     Motor: No abnormal muscle tone.     Coordination: Coordination normal.     Gait: Gait normal.     Deep Tendon Reflexes: Reflexes are normal and symmetric.  Psychiatric:        Mood and Affect: Mood and affect normal.        Behavior: Behavior normal. Behavior is cooperative.        Thought Content: Thought content normal.        Judgment: Judgment normal.        Assessment/Plan: 1. Encounter for routine  adult health examination with abnormal findings Age-appropriate preventive screenings and vaccinations discussed, annual physical exam completed. Routine labs for health maintenance done prior to office visit, results discussed with patient today. Marland Kitchen PHM updated.   2. Dysuria Routine urinalysis done  - UA/M w/rflx Culture, Routine - Microscopic Examination - Urine Culture, Reflex  3. Encounter for screening mammogram for malignant neoplasm of breast Routine mammogram ordered.  - MM 3D SCREEN BREAST W/IMPLANT BILATERAL; Future  4. Generalized anxiety disorder Lorazepam refills ordered.  - LORazepam (ATIVAN) 0.5 MG tablet; Take one tab po qd as needed for anxiety  Dispense: 30 tablet; Refill: 1      General Counseling: Tiki verbalizes understanding of the findings of todays visit and agrees with plan of treatment. I have discussed any further diagnostic evaluation that may be needed or ordered today. We also reviewed her medications today. she has been encouraged to call the office with any questions or concerns that should arise related to todays visit.    Orders Placed This Encounter  Procedures   Microscopic Examination   Urine Culture, Reflex   MM 3D SCREEN BREAST W/IMPLANT BILATERAL   UA/M w/rflx Culture, Routine    Meds ordered this encounter  Medications   LORazepam (ATIVAN) 0.5 MG tablet    Sig: Take one tab po qd as needed for anxiety    Dispense:  30 tablet    Refill:  1    Return in about 1 year (around 10/23/2023) for CPE, [place] PCP and as needed otherwise .   Total time spent:30 Minutes Time spent includes review of chart, medications, test results, and follow up plan with the patient.   [place] Controlled Substance Database was reviewed by me.  This patient was seen by Jonetta Osgood, FNP-C in collaboration with Dr. Clayborn Bigness as a part of collaborative care agreement.  Bryceton Hantz R. Valetta Fuller, MSN, FNP-C Internal medicine

## 2022-10-25 ENCOUNTER — Encounter: Payer: Self-pay | Admitting: Nurse Practitioner

## 2022-10-27 LAB — UA/M W/RFLX CULTURE, ROUTINE
Bilirubin, UA: NEGATIVE
Glucose, UA: NEGATIVE
Ketones, UA: NEGATIVE
Nitrite, UA: NEGATIVE
Protein,UA: NEGATIVE
RBC, UA: NEGATIVE
Specific Gravity, UA: 1.007 (ref 1.005–1.030)
Urobilinogen, Ur: 0.2 mg/dL (ref 0.2–1.0)
pH, UA: 7 (ref 5.0–7.5)

## 2022-10-27 LAB — URINE CULTURE, REFLEX

## 2022-10-27 LAB — MICROSCOPIC EXAMINATION
Bacteria, UA: NONE SEEN
Casts: NONE SEEN /lpf
RBC, Urine: NONE SEEN /hpf (ref 0–2)
WBC, UA: NONE SEEN /hpf (ref 0–5)

## 2022-11-02 DIAGNOSIS — Z08 Encounter for follow-up examination after completed treatment for malignant neoplasm: Secondary | ICD-10-CM | POA: Diagnosis not present

## 2022-11-02 DIAGNOSIS — D2272 Melanocytic nevi of left lower limb, including hip: Secondary | ICD-10-CM | POA: Diagnosis not present

## 2022-11-02 DIAGNOSIS — D225 Melanocytic nevi of trunk: Secondary | ICD-10-CM | POA: Diagnosis not present

## 2022-11-02 DIAGNOSIS — Z85828 Personal history of other malignant neoplasm of skin: Secondary | ICD-10-CM | POA: Diagnosis not present

## 2022-11-02 DIAGNOSIS — L821 Other seborrheic keratosis: Secondary | ICD-10-CM | POA: Diagnosis not present

## 2022-11-02 DIAGNOSIS — D2261 Melanocytic nevi of right upper limb, including shoulder: Secondary | ICD-10-CM | POA: Diagnosis not present

## 2022-11-02 DIAGNOSIS — D2271 Melanocytic nevi of right lower limb, including hip: Secondary | ICD-10-CM | POA: Diagnosis not present

## 2022-11-02 DIAGNOSIS — D2262 Melanocytic nevi of left upper limb, including shoulder: Secondary | ICD-10-CM | POA: Diagnosis not present

## 2022-11-24 ENCOUNTER — Encounter: Payer: Self-pay | Admitting: Podiatry

## 2022-11-24 ENCOUNTER — Ambulatory Visit: Payer: Medicare HMO | Admitting: Podiatry

## 2022-11-24 DIAGNOSIS — S93692A Other sprain of left foot, initial encounter: Secondary | ICD-10-CM | POA: Diagnosis not present

## 2022-11-24 MED ORDER — TRIAMCINOLONE ACETONIDE 40 MG/ML IJ SUSP
20.0000 mg | Freq: Once | INTRAMUSCULAR | Status: AC
  Administered 2022-11-24: 20 mg

## 2022-11-24 NOTE — Progress Notes (Signed)
She presents today states that this left foot is giving me if it.  She like to consider surgery.  She goes on to ask about a Lapa plasty and hammertoe repairs as well.  Though the majority of her foot pain is in her calcaneus.  She states that is just relentless does not seem that that shots helped much at all.  Objective: Vital signs stable alert oriented x 3 pulses are palpable.  She has severe pain on palpation MucoClear tubercle of the left heel.  Moderate to severe hallux valgus deformity with hammertoe deformities.  Assessment: Probable tear of the plantar fascia left.  Hallux valgus deformity hammertoe deformities left.  Plan: Discussed etiology pathology conservative surgical therapies at this point I am requesting MRI for evaluation and surgical intervention regarding a possible tear of the plantar fascia.  If she would like to have this fixed with a Lapa plasty I will refer her to Dr. Sherryle Lis.

## 2022-12-10 ENCOUNTER — Encounter: Payer: Self-pay | Admitting: Podiatry

## 2022-12-12 ENCOUNTER — Ambulatory Visit
Admission: RE | Admit: 2022-12-12 | Discharge: 2022-12-12 | Disposition: A | Discharge status: Home / Self Care | Payer: Medicare HMO | Source: Ambulatory Visit | Attending: Podiatry | Admitting: Podiatry

## 2022-12-12 DIAGNOSIS — S93692A Other sprain of left foot, initial encounter: Secondary | ICD-10-CM

## 2022-12-12 DIAGNOSIS — M7732 Calcaneal spur, left foot: Secondary | ICD-10-CM | POA: Diagnosis not present

## 2022-12-12 DIAGNOSIS — S86312A Strain of muscle(s) and tendon(s) of peroneal muscle group at lower leg level, left leg, initial encounter: Secondary | ICD-10-CM | POA: Diagnosis not present

## 2022-12-16 ENCOUNTER — Other Ambulatory Visit: Payer: Self-pay | Admitting: Podiatry

## 2022-12-16 ENCOUNTER — Other Ambulatory Visit: Payer: Self-pay | Admitting: Physician Assistant

## 2022-12-16 DIAGNOSIS — E782 Mixed hyperlipidemia: Secondary | ICD-10-CM

## 2022-12-30 ENCOUNTER — Ambulatory Visit (INDEPENDENT_AMBULATORY_CARE_PROVIDER_SITE_OTHER): Payer: Medicare HMO | Admitting: Podiatry

## 2022-12-30 ENCOUNTER — Encounter: Payer: Self-pay | Admitting: Podiatry

## 2022-12-30 DIAGNOSIS — M722 Plantar fascial fibromatosis: Secondary | ICD-10-CM | POA: Diagnosis not present

## 2022-12-30 NOTE — Progress Notes (Signed)
She presents today with her husband for follow-up of her MRI which does not demonstrate any type of plantar fascial injury.  So she is very happy with that.  She is already read the results online.  She would like to consider surgical intervention regarding her heel to make it quit hurting as well as her bunion deformity.  Objective: Vital signs stable she is alert oriented x 3 pulses are palpable.  She still has moderate to severe pain on palpation medial calcaneal tubercle of her left heel with moderate to severe hallux abductovalgus deformity with some hypermobility at the first tarsometatarsal joint.  She did discuss Lapa plasty with me and I explained to her that we have 1 doctor here that does that I would like to consider using him.  Assessment: Chronic intractable [place] fasciitis of her left heel severe hallux abductovalgus deformity left.  Plan: She will follow-up with Dr. Lilian Kapur in June for consult.

## 2023-02-10 ENCOUNTER — Ambulatory Visit: Payer: Medicare HMO | Admitting: Podiatry

## 2023-02-10 ENCOUNTER — Ambulatory Visit
Admission: RE | Admit: 2023-02-10 | Discharge: 2023-02-10 | Disposition: A | Discharge status: Home / Self Care | Payer: Medicare HMO | Source: Ambulatory Visit | Attending: Nurse Practitioner | Admitting: Nurse Practitioner

## 2023-02-10 ENCOUNTER — Telehealth: Payer: Self-pay

## 2023-02-10 DIAGNOSIS — M722 Plantar fascial fibromatosis: Secondary | ICD-10-CM

## 2023-02-10 DIAGNOSIS — L84 Corns and callosities: Secondary | ICD-10-CM | POA: Diagnosis not present

## 2023-02-10 DIAGNOSIS — E559 Vitamin D deficiency, unspecified: Secondary | ICD-10-CM

## 2023-02-10 DIAGNOSIS — M2012 Hallux valgus (acquired), left foot: Secondary | ICD-10-CM

## 2023-02-10 DIAGNOSIS — M21612 Bunion of left foot: Secondary | ICD-10-CM | POA: Diagnosis not present

## 2023-02-10 DIAGNOSIS — M2042 Other hammer toe(s) (acquired), left foot: Secondary | ICD-10-CM | POA: Diagnosis not present

## 2023-02-10 DIAGNOSIS — Z1231 Encounter for screening mammogram for malignant neoplasm of breast: Secondary | ICD-10-CM | POA: Diagnosis not present

## 2023-02-10 NOTE — Telephone Encounter (Signed)
Received surgery paperwork from the [place] office. Left a message for Deaudra to call and schedule surgery with Dr. Lilian Kapur.

## 2023-02-11 NOTE — Telephone Encounter (Signed)
Paula [name] called back to schedule her surgery. She stated she cruises a lot and was not available for the dates Dr. Lilian Kapur had available. She stated she will call back later in the year to get on next years surgery schedule.

## 2023-02-12 NOTE — Progress Notes (Signed)
  Subjective:  Patient ID: Paula [name], female    DOB: August 03, 1956,  MRN: [id number]  Painful bunion and plantar fasciitis left foot  67 y.o. female presents with the above complaint. History confirmed with patient.  She returns for follow-up, her bunion is becoming more more painful, wearing shoes and ambulating is becoming more difficult due to this.  It is starting to impact her daily activities.  Will plan to fasciitis is doing better after the last injection but has been an ongoing issue.  She also notes pain corn between the fourth and fifth toes  Objective:  Physical Exam: warm, good capillary refill, no trophic changes or ulcerative lesions, normal DP and PT pulses, normal sensory exam, and bilaterally with left worse than right she has significant hallux valgus with painful bunion deformity to palpation, good range of motion of the joint, hypermobility of the first ray is noted,, mild tenderness to palpation of the plantar fascia on the left foot.  Heloma molle lateral fourth interspace    Radiographs: Prior left foot radiographs taken 08/10/2022 show moderate to severe hallux valgus forming large bunion medially, deviation of the sesamoids and hallux laterally, large increase in intermetatarsal angle Assessment:   1. Hallux valgus with bunions, left   2. Vitamin D deficiency   3. Hammertoe of left foot   4. Callus of foot   5. Plantar fasciitis of left foot      Plan:  Patient was evaluated and treated and all questions answered.  Discussed the etiology and treatment including surgical and non surgical treatment for painful bunions, the interdigital corn, plantar fasciitis and hammertoes. She has exhausted all non surgical treatment prior to this visit including shoe gear changes and padding. She desires surgical intervention. We discussed all risks including but not limited to: pain, swelling, infection, scar, numbness which may be temporary or permanent, chronic pain,  stiffness, nerve pain or damage, wound healing problems, bone healing problems including delayed or non-union and recurrence. Specifically we discussed the following procedures: Lapidus bunionectomy, possible Akin, arthroplasty of the fourth and/or fifth toes to alleviate interdigital corn formation, possible EPF pending her symptoms at the time. Informed consent was signed today. Surgery will be scheduled at a mutually agreeable date. Information regarding this will be forwarded to our surgery scheduler. In the interim until surgery I recommended utilizing as wide of shoes as possible, take NSAIDs or tylenol as tolerated for pain, and a bunion padding shield which can be purchased online.  Vitamin D ordered and she will obtain this and eventually supplementation.  We discussed timing of the surgery as well and advised her that she will need at least 8 weeks for recovery before she will go back to a regular shoe and should not plan any trips during this time period.   Surgical plan:  Procedure: -Lapidus bunionectomy, possible Akin, possible arthroplasty of fourth and fifth toes with excision of corn, possible EPF  Location: -[facility]  Anesthesia plan: -IV sedation with regional block  Postoperative pain plan: - Tylenol 1000 mg every 6 hours, ibuprofen 600 mg every 6 hours, gabapentin 300 mg every 8 hours x5 days, oxycodone 5 mg 1-2 tabs every 6 hours only as needed  DVT prophylaxis: -None required  WB Restrictions / DME needs: -NWB in boot postop  No follow-ups on file.

## 2023-03-04 ENCOUNTER — Other Ambulatory Visit: Payer: Self-pay | Admitting: Podiatry

## 2023-04-11 DIAGNOSIS — T63441A Toxic effect of venom of bees, accidental (unintentional), initial encounter: Secondary | ICD-10-CM | POA: Diagnosis not present

## 2023-04-11 DIAGNOSIS — R2242 Localized swelling, mass and lump, left lower limb: Secondary | ICD-10-CM | POA: Diagnosis not present

## 2023-04-11 DIAGNOSIS — I10 Essential (primary) hypertension: Secondary | ICD-10-CM | POA: Diagnosis not present

## 2023-04-11 DIAGNOSIS — R2233 Localized swelling, mass and lump, upper limb, bilateral: Secondary | ICD-10-CM | POA: Diagnosis not present

## 2023-04-11 DIAGNOSIS — Z79899 Other long term (current) drug therapy: Secondary | ICD-10-CM | POA: Diagnosis not present

## 2023-04-11 DIAGNOSIS — T63461A Toxic effect of venom of wasps, accidental (unintentional), initial encounter: Secondary | ICD-10-CM | POA: Diagnosis not present

## 2023-04-13 ENCOUNTER — Telehealth: Payer: Self-pay | Admitting: Nurse Practitioner

## 2023-04-13 NOTE — Telephone Encounter (Signed)
Patient declined ED follow up-Paula [name]

## 2023-05-06 ENCOUNTER — Other Ambulatory Visit: Payer: Self-pay | Admitting: Podiatry

## 2023-05-13 ENCOUNTER — Other Ambulatory Visit: Payer: Self-pay

## 2023-05-13 ENCOUNTER — Telehealth: Payer: Self-pay

## 2023-05-13 MED ORDER — AZITHROMYCIN 250 MG PO TABS: ORAL_TABLET | ORAL | 0 refills | Status: DC

## 2023-05-13 NOTE — Telephone Encounter (Signed)
Pt called that she is having  sinus infection ,sinus drainage ,ear pain ,no fever and no coughing as per dr Welton Flakes advised her that we send zpak  and take Claritin D or Zyrtec D OTC and she can use Flonase  and if she not feeling better go to urgent care

## 2023-05-18 ENCOUNTER — Other Ambulatory Visit: Payer: Self-pay

## 2023-05-18 ENCOUNTER — Telehealth: Payer: Self-pay

## 2023-05-18 MED ORDER — PREDNISONE 10 MG (21) PO TBPK: ORAL_TABLET | ORAL | 0 refills | Status: DC

## 2023-05-18 NOTE — Telephone Encounter (Signed)
Pt called that she had poison oak and she try otc is not helping as per alyssa sent prednisone advised if not feeling better need appt

## 2023-06-01 ENCOUNTER — Ambulatory Visit: Payer: Medicare HMO | Admitting: Podiatry

## 2023-06-01 DIAGNOSIS — M722 Plantar fascial fibromatosis: Secondary | ICD-10-CM | POA: Diagnosis not present

## 2023-06-01 DIAGNOSIS — M62462 Contracture of muscle, left lower leg: Secondary | ICD-10-CM

## 2023-06-01 NOTE — Progress Notes (Signed)
Subjective:  Patient ID: Paula [name], female    DOB: 1955-10-12,  MRN: [id number]  Chief Complaint  Patient presents with   Injections    67 y.o. female presents with the above complaint.  Patient presents with continuous pain to the left plantar fascia.  Patient states painful to touch is progressive gotten worse worse with ambulation worse with pressure in the past injection has helped.  She would like to discuss another injection.  Is been many months she is he got 1.  Pain scale 7 out of 10 dull aching nature worse with taking for step in the morning   Review of Systems: Negative except as noted in the HPI. Denies N/V/F/Ch.  Past Medical History:  Diagnosis Date   Asthma    Environmental allergies    GERD (gastroesophageal reflux disease)    Hypertension    Migraine    Sinusitis     Current Outpatient Medications:    azithromycin (ZITHROMAX) 250 MG tablet, Use as directed for 5 days, Disp: 6 each, Rfl: 0   celecoxib (CELEBREX) 100 MG capsule, Take 1 capsule (100 mg total) by mouth 2 (two) times daily., Disp: 180 capsule, Rfl: 1   ciprofloxacin (CIPRO) 500 MG tablet, Take 1 tablet (500 mg total) by mouth 2 (two) times daily., Disp: 20 tablet, Rfl: 0   erythromycin ophthalmic ointment, 3 (three) times daily., Disp: , Rfl:    fexofenadine-pseudoephedrine (ALLEGRA-D 24) 180-240 MG 24 hr tablet, Take 1 tablet by mouth daily., Disp: , Rfl:    fluticasone (FLONASE) 50 MCG/ACT nasal spray, Place 2 sprays into both nostrils daily., Disp: 16 g, Rfl: 6   LORazepam (ATIVAN) 0.5 MG tablet, Take one tab po qd as needed for anxiety, Disp: 30 tablet, Rfl: 1   meloxicam (MOBIC) 15 MG tablet, TAKE 1 TABLET BY MOUTH DAILY AS NEEDED FOR FOOT PAIN, Disp: 30 tablet, Rfl: 0   montelukast (SINGULAIR) 10 MG tablet, Take 1 tablet (10 mg total) by mouth at bedtime., Disp: 90 tablet, Rfl: 1   ofloxacin (OCUFLOX) 0.3 % ophthalmic solution, Place 1 drop into both eyes 4 (four) times daily. For 5 days,  Disp: 5 mL, Rfl: 0   pantoprazole (PROTONIX) 40 MG tablet, Take 1 tablet (40 mg total) by mouth daily., Disp: 90 tablet, Rfl: 1   predniSONE (STERAPRED UNI-PAK 21 TAB) 10 MG (21) TBPK tablet, Use as directed  for 6 days, Disp: 21 tablet, Rfl: 0   rosuvastatin (CRESTOR) 5 MG tablet, TAKE 1 TABLET BY MOUTH TWICE A WEEK, Disp: 24 tablet, Rfl: 1   spironolactone (ALDACTONE) 25 MG tablet, TAKE 1 TABLET BY MOUTH 2 TIMES DAILY, Disp: 180 tablet, Rfl: 1  Social History   Tobacco Use  Smoking Status Former   Current packs/day: 0.00   Types: Cigarettes   Quit date: 12/01/2004   Years since quitting: 18.5  Smokeless Tobacco Never    No Known Allergies Objective:  There were no vitals filed for this visit. There is no height or weight on file to calculate BMI. Constitutional Well developed. Well nourished.  Vascular Dorsalis pedis pulses palpable bilaterally. Posterior tibial pulses palpable bilaterally. Capillary refill normal to all digits.  No cyanosis or clubbing noted. Pedal hair growth normal.  Neurologic Normal speech. Oriented to person, place, and time. Epicritic sensation to light touch grossly present bilaterally.  Dermatologic Nails well groomed and normal in appearance. No open wounds. No skin lesions.  Orthopedic: Normal joint ROM without pain or crepitus bilaterally. No visible deformities. Tender  to palpation at the calcaneal tuber left. No pain with calcaneal squeeze left. Ankle ROM diminished range of motion left. Silf[place]old Test: positive left.   Radiographs: None  Assessment:   1. Plantar fasciitis of left foot   2. Gastrocnemius equinus, left    Plan:  Patient was evaluated and treated and all questions answered.  Plantar Fasciitis, left with underlying gastrocnemius equinus - XR reviewed as above.  - Educated on icing and stretching. Instructions given.  - Injection delivered to the plantar fascia as below. - DME: Plantar fascial brace dispensed to  support the medial longitudinal arch of the foot and offload pressure from the heel and prevent arch collapse during weightbearing - Pharmacologic management: None  Procedure: Injection Tendon/Ligament Location: Left plantar fascia at the glabrous junction; medial approach. Skin Prep: alcohol Injectate: 0.5 cc 0.5% marcaine plain, 0.5 cc of 1% Lidocaine, 0.5 cc kenalog 10. Disposition: Patient tolerated procedure well. Injection site dressed with a band-aid.  No follow-ups on file.

## 2023-06-02 ENCOUNTER — Encounter: Payer: Self-pay | Admitting: Nurse Practitioner

## 2023-06-02 ENCOUNTER — Ambulatory Visit (INDEPENDENT_AMBULATORY_CARE_PROVIDER_SITE_OTHER): Payer: Medicare HMO | Admitting: Nurse Practitioner

## 2023-06-02 VITALS — BP 138/70 | HR 72 | Temp 98.5°F | Resp 16 | Ht 63.0 in | Wt 183.3 lb

## 2023-06-02 DIAGNOSIS — L659 Nonscarring hair loss, unspecified: Secondary | ICD-10-CM | POA: Diagnosis not present

## 2023-06-02 DIAGNOSIS — J0100 Acute maxillary sinusitis, unspecified: Secondary | ICD-10-CM

## 2023-06-02 DIAGNOSIS — K219 Gastro-esophageal reflux disease without esophagitis: Secondary | ICD-10-CM

## 2023-06-02 DIAGNOSIS — B379 Candidiasis, unspecified: Secondary | ICD-10-CM

## 2023-06-02 DIAGNOSIS — Z79899 Other long term (current) drug therapy: Secondary | ICD-10-CM

## 2023-06-02 DIAGNOSIS — E559 Vitamin D deficiency, unspecified: Secondary | ICD-10-CM

## 2023-06-02 DIAGNOSIS — Z0000 Encounter for general adult medical examination without abnormal findings: Secondary | ICD-10-CM

## 2023-06-02 DIAGNOSIS — R7989 Other specified abnormal findings of blood chemistry: Secondary | ICD-10-CM | POA: Diagnosis not present

## 2023-06-02 DIAGNOSIS — E538 Deficiency of other specified B group vitamins: Secondary | ICD-10-CM | POA: Diagnosis not present

## 2023-06-02 DIAGNOSIS — J45991 Cough variant asthma: Secondary | ICD-10-CM

## 2023-06-02 DIAGNOSIS — E782 Mixed hyperlipidemia: Secondary | ICD-10-CM

## 2023-06-02 DIAGNOSIS — F411 Generalized anxiety disorder: Secondary | ICD-10-CM

## 2023-06-02 DIAGNOSIS — J0141 Acute recurrent pansinusitis: Secondary | ICD-10-CM

## 2023-06-02 MED ORDER — CELECOXIB 100 MG PO CAPS
100.0000 mg | ORAL_CAPSULE | Freq: Two times a day (BID) | ORAL | 1 refills | Status: DC
Start: 2023-06-02 — End: 2023-08-02

## 2023-06-02 MED ORDER — LORAZEPAM 0.5 MG PO TABS
ORAL_TABLET | ORAL | 2 refills | Status: DC
Start: 2023-06-02 — End: 2023-10-28

## 2023-06-02 MED ORDER — PANTOPRAZOLE SODIUM 40 MG PO TBEC
40.0000 mg | DELAYED_RELEASE_TABLET | Freq: Every day | ORAL | 1 refills | Status: DC
Start: 2023-06-02 — End: 2023-10-28

## 2023-06-02 MED ORDER — ROSUVASTATIN CALCIUM 5 MG PO TABS
ORAL_TABLET | ORAL | 1 refills | Status: DC
Start: 2023-06-02 — End: 2023-10-28

## 2023-06-02 MED ORDER — FLUCONAZOLE 150 MG PO TABS
150.0000 mg | ORAL_TABLET | Freq: Once | ORAL | 0 refills | Status: AC
Start: 2023-06-02 — End: 2023-06-02

## 2023-06-02 MED ORDER — AMOXICILLIN-POT CLAVULANATE 875-125 MG PO TABS
1.0000 | ORAL_TABLET | Freq: Two times a day (BID) | ORAL | 0 refills | Status: DC
Start: 2023-06-02 — End: 2023-08-02

## 2023-06-02 MED ORDER — MONTELUKAST SODIUM 10 MG PO TABS
10.0000 mg | ORAL_TABLET | Freq: Every day | ORAL | 1 refills | Status: DC
Start: 2023-06-02 — End: 2023-10-28

## 2023-06-02 MED ORDER — FLUTICASONE PROPIONATE 50 MCG/ACT NA SUSP
2.0000 | Freq: Every day | NASAL | 6 refills | Status: DC
Start: 2023-06-02 — End: 2024-07-24

## 2023-06-02 NOTE — Progress Notes (Signed)
[facility] [address] [place], [place] [address]  Internal MEDICINE  Office Visit Note  Patient Name: Paula [name]  [id number]  [id number]  Date of Service: 06/02/2023  Chief Complaint  Patient presents with   Acute Visit    Sore throat and left ear pain. X 3 days.      HPI Stellah presents for an acute sick visit for symptoms of sinusitis  --onset was about 4 days ago  Covid test was negative Reports nasal congestion, ear pain, sinus pressure, sore throat, slight cough,  Due for multiple routine medication refills  Due for routine labs for upcoming annual wellness visit in February.    Current Medication:  Outpatient Encounter Medications as of 06/02/2023  Medication Sig   amoxicillin-clavulanate (AUGMENTIN) 875-125 MG tablet Take 1 tablet by mouth 2 (two) times daily.   erythromycin ophthalmic ointment 3 (three) times daily.   fexofenadine-pseudoephedrine (ALLEGRA-D 24) 180-240 MG 24 hr tablet Take 1 tablet by mouth daily.   [EXPIRED] fluconazole (DIFLUCAN) 150 MG tablet Take 1 tablet (150 mg total) by mouth once for 1 dose. May take an additional dose after 3 days if still symptomatic.   meloxicam (MOBIC) 15 MG tablet TAKE 1 TABLET BY MOUTH DAILY AS NEEDED FOR FOOT PAIN   [DISCONTINUED] azithromycin (ZITHROMAX) 250 MG tablet Use as directed for 5 days   [DISCONTINUED] celecoxib (CELEBREX) 100 MG capsule Take 1 capsule (100 mg total) by mouth 2 (two) times daily.   [DISCONTINUED] ciprofloxacin (CIPRO) 500 MG tablet Take 1 tablet (500 mg total) by mouth 2 (two) times daily.   [DISCONTINUED] fluticasone (FLONASE) 50 MCG/ACT nasal spray Place 2 sprays into both nostrils daily.   [DISCONTINUED] LORazepam (ATIVAN) 0.5 MG tablet Take one tab po qd as needed for anxiety   [DISCONTINUED] montelukast (SINGULAIR) 10 MG tablet Take 1 tablet (10 mg total) by mouth at bedtime.   [DISCONTINUED] ofloxacin (OCUFLOX) 0.3 % ophthalmic solution Place 1 drop into both eyes 4  (four) times daily. For 5 days   [DISCONTINUED] pantoprazole (PROTONIX) 40 MG tablet Take 1 tablet (40 mg total) by mouth daily.   [DISCONTINUED] predniSONE (STERAPRED UNI-PAK 21 TAB) 10 MG (21) TBPK tablet Use as directed  for 6 days   [DISCONTINUED] rosuvastatin (CRESTOR) 5 MG tablet TAKE 1 TABLET BY MOUTH TWICE A WEEK   [DISCONTINUED] spironolactone (ALDACTONE) 25 MG tablet TAKE 1 TABLET BY MOUTH 2 TIMES DAILY   celecoxib (CELEBREX) 100 MG capsule Take 1 capsule (100 mg total) by mouth 2 (two) times daily.   fluticasone (FLONASE) 50 MCG/ACT nasal spray Place 2 sprays into both nostrils daily.   LORazepam (ATIVAN) 0.5 MG tablet Take one tab po qd as needed for anxiety   montelukast (SINGULAIR) 10 MG tablet Take 1 tablet (10 mg total) by mouth at bedtime.   pantoprazole (PROTONIX) 40 MG tablet Take 1 tablet (40 mg total) by mouth daily.   rosuvastatin (CRESTOR) 5 MG tablet TAKE 1 TABLET BY MOUTH TWICE A WEEK   No facility-administered encounter medications on file as of 06/02/2023.      Medical History: Past Medical History:  Diagnosis Date   Asthma    Environmental allergies    GERD (gastroesophageal reflux disease)    Hypertension    Migraine    Sinusitis      Vital Signs: BP 138/70 Comment: 160/78  Pulse 72   Temp 98.5 F (36.9 C)   Resp 16   Ht 5\' 3"  (1.6 m)   Wt 183  lb 4.8 oz (83.1 kg)   SpO2 94%   BMI 32.47 kg/m    Review of Systems  Constitutional:  Positive for fatigue.  HENT:  Positive for congestion, ear pain, postnasal drip, sinus pressure, sinus pain and sore throat.   Respiratory:  Positive for cough. Negative for chest tightness, shortness of breath and wheezing.   Cardiovascular: Negative.  Negative for chest pain and palpitations.  Neurological:  Positive for headaches.    Physical Exam Vitals reviewed.  Constitutional:      General: She is not in acute distress.    Appearance: Normal appearance. She is obese. She is not ill-appearing.  HENT:      Head: Normocephalic and atraumatic.     Right Ear: Swelling and tenderness present. Tympanic membrane is bulging.     Left Ear: Tympanic membrane normal. Swelling present.     Nose: Mucosal edema, congestion and rhinorrhea present.     Right Turbinates: Enlarged and swollen.     Left Turbinates: Enlarged and swollen.     Right Sinus: Maxillary sinus tenderness present. No frontal sinus tenderness.     Left Sinus: Maxillary sinus tenderness present. No frontal sinus tenderness.     Mouth/Throat:     Lips: Pink.     Pharynx: Uvula midline. Pharyngeal swelling and posterior oropharyngeal erythema present.  Eyes:     Pupils: Pupils are equal, round, and reactive to light.  Cardiovascular:     Rate and Rhythm: Normal rate and regular rhythm.  Pulmonary:     Effort: Pulmonary effort is normal. No respiratory distress.  Neurological:     Mental Status: She is alert and oriented to person, place, and time.  Psychiatric:        Mood and Affect: Mood normal.        Behavior: Behavior normal.       Assessment/Plan: 1. Acute non-recurrent maxillary sinusitis Empiric antibiotic treatment prescribed.  - amoxicillin-clavulanate (AUGMENTIN) 875-125 MG tablet; Take 1 tablet by mouth 2 (two) times daily.  Dispense: 20 tablet; Refill: 0  2. Antibiotic-induced yeast infection Fluconazole ordered  - fluconazole (DIFLUCAN) 150 MG tablet; Take 1 tablet (150 mg total) by mouth once for 1 dose. May take an additional dose after 3 days if still symptomatic.  Dispense: 3 tablet; Refill: 0  3. Mixed hyperlipidemia Routine labs ordered  - CBC with Differential/Platelet - CMP14+EGFR - Lipid Profile - TSH + free T4 - Vitamin D (25 hydroxy) - B12 and Folate Panel  4. Hair loss Routine labs ordered  - CBC with Differential/Platelet - CMP14+EGFR - Lipid Profile - TSH + free T4 - Vitamin D (25 hydroxy) - B12 and Folate Panel  5. Elevated serum creatinine Routine labs ordered - CBC with  Differential/Platelet - CMP14+EGFR - Lipid Profile - TSH + free T4 - Vitamin D (25 hydroxy) - B12 and Folate Panel  6. B12 deficiency Routine labs ordered  - CBC with Differential/Platelet - B12 and Folate Panel  7. Vitamin D deficiency Routine lab ordered  - Vitamin D (25 hydroxy)  8. Routine health maintenance Routine labs ordered - CBC with Differential/Platelet - CMP14+EGFR - Lipid Profile - TSH + free T4 - Vitamin D (25 hydroxy) - B12 and Folate Panel  9. Encounter for medication review Medication list reviewed, updated and refills ordered  - LORazepam (ATIVAN) 0.5 MG tablet; Take one tab po qd as needed for anxiety  Dispense: 30 tablet; Refill: 2 - rosuvastatin (CRESTOR) 5 MG tablet; TAKE 1 TABLET BY MOUTH  TWICE A WEEK  Dispense: 24 tablet; Refill: 1 - montelukast (SINGULAIR) 10 MG tablet; Take 1 tablet (10 mg total) by mouth at bedtime.  Dispense: 90 tablet; Refill: 1 - celecoxib (CELEBREX) 100 MG capsule; Take 1 capsule (100 mg total) by mouth 2 (two) times daily.  Dispense: 180 capsule; Refill: 1 - fluticasone (FLONASE) 50 MCG/ACT nasal spray; Place 2 sprays into both nostrils daily.  Dispense: 16 g; Refill: 6 - pantoprazole (PROTONIX) 40 MG tablet; Take 1 tablet (40 mg total) by mouth daily.  Dispense: 90 tablet; Refill: 1   General Counseling: Seth verbalizes understanding of the findings of todays visit and agrees with plan of treatment. I have discussed any further diagnostic evaluation that may be needed or ordered today. We also reviewed her medications today. she has been encouraged to call the office with any questions or concerns that should arise related to todays visit.    Counseling:    Orders Placed This Encounter  Procedures   CBC with Differential/Platelet   CMP14+EGFR   Lipid Profile   TSH + free T4   Vitamin D (25 hydroxy)   B12 and Folate Panel    Meds ordered this encounter  Medications   amoxicillin-clavulanate (AUGMENTIN) 875-125  MG tablet    Sig: Take 1 tablet by mouth 2 (two) times daily.    Dispense:  20 tablet    Refill:  0   fluconazole (DIFLUCAN) 150 MG tablet    Sig: Take 1 tablet (150 mg total) by mouth once for 1 dose. May take an additional dose after 3 days if still symptomatic.    Dispense:  3 tablet    Refill:  0   LORazepam (ATIVAN) 0.5 MG tablet    Sig: Take one tab po qd as needed for anxiety    Dispense:  30 tablet    Refill:  2   rosuvastatin (CRESTOR) 5 MG tablet    Sig: TAKE 1 TABLET BY MOUTH TWICE A WEEK    Dispense:  24 tablet    Refill:  1   montelukast (SINGULAIR) 10 MG tablet    Sig: Take 1 tablet (10 mg total) by mouth at bedtime.    Dispense:  90 tablet    Refill:  1   celecoxib (CELEBREX) 100 MG capsule    Sig: Take 1 capsule (100 mg total) by mouth 2 (two) times daily.    Dispense:  180 capsule    Refill:  1   fluticasone (FLONASE) 50 MCG/ACT nasal spray    Sig: Place 2 sprays into both nostrils daily.    Dispense:  16 g    Refill:  6   pantoprazole (PROTONIX) 40 MG tablet    Sig: Take 1 tablet (40 mg total) by mouth daily.    Dispense:  90 tablet    Refill:  1    Return if symptoms worsen or fail to improve.  [place] Controlled Substance Database was reviewed by me for overdose risk score (ORS)  Time spent:30 Minutes Time spent with patient included reviewing progress notes, labs, imaging studies, and discussing plan for follow up.   This patient was seen by Sallyanne Kuster, FNP-C in collaboration with Dr. Beverely Risen as a part of collaborative care agreement.  Lexa Coronado R. Tedd Sias, MSN, FNP-C Internal Medicine

## 2023-06-03 ENCOUNTER — Other Ambulatory Visit: Payer: Self-pay

## 2023-06-03 DIAGNOSIS — L659 Nonscarring hair loss, unspecified: Secondary | ICD-10-CM

## 2023-06-03 MED ORDER — SPIRONOLACTONE 25 MG PO TABS: 25.0000 mg | ORAL_TABLET | Freq: Two times a day (BID) | ORAL | 1 refills | Status: DC

## 2023-07-25 DIAGNOSIS — R197 Diarrhea, unspecified: Secondary | ICD-10-CM | POA: Diagnosis not present

## 2023-07-25 DIAGNOSIS — R509 Fever, unspecified: Secondary | ICD-10-CM | POA: Diagnosis not present

## 2023-07-25 DIAGNOSIS — R7989 Other specified abnormal findings of blood chemistry: Secondary | ICD-10-CM | POA: Diagnosis not present

## 2023-08-02 ENCOUNTER — Encounter: Payer: Self-pay | Admitting: Physician Assistant

## 2023-08-02 ENCOUNTER — Ambulatory Visit (INDEPENDENT_AMBULATORY_CARE_PROVIDER_SITE_OTHER): Payer: Medicare HMO | Admitting: Physician Assistant

## 2023-08-02 VITALS — BP 110/70 | HR 86 | Temp 98.3°F | Resp 16 | Ht 63.0 in | Wt 179.8 lb

## 2023-08-02 DIAGNOSIS — H2513 Age-related nuclear cataract, bilateral: Secondary | ICD-10-CM | POA: Diagnosis not present

## 2023-08-02 DIAGNOSIS — H52223 Regular astigmatism, bilateral: Secondary | ICD-10-CM | POA: Diagnosis not present

## 2023-08-02 DIAGNOSIS — R197 Diarrhea, unspecified: Secondary | ICD-10-CM

## 2023-08-02 DIAGNOSIS — H5213 Myopia, bilateral: Secondary | ICD-10-CM | POA: Diagnosis not present

## 2023-08-02 DIAGNOSIS — H2511 Age-related nuclear cataract, right eye: Secondary | ICD-10-CM | POA: Diagnosis not present

## 2023-08-02 DIAGNOSIS — H2512 Age-related nuclear cataract, left eye: Secondary | ICD-10-CM | POA: Diagnosis not present

## 2023-08-02 MED ORDER — MELOXICAM 15 MG PO TABS: 15.0000 mg | ORAL_TABLET | Freq: Every day | ORAL | 2 refills | Status: DC | PRN

## 2023-08-02 MED ORDER — MUPIROCIN 2 % EX OINT: 1.0000 | TOPICAL_OINTMENT | Freq: Two times a day (BID) | CUTANEOUS | 0 refills | Status: AC | PRN

## 2023-08-02 NOTE — Progress Notes (Signed)
[facility] [address] [place], [place] [address]  Internal MEDICINE  Office Visit Note  Patient Name: Paula [name]  [id number]  [id number]  Date of Service: 08/17/2023  Chief Complaint  Patient presents with   Follow-up   Diarrhea   Gastroesophageal Reflux   Hypertension    HPI Pt is here for ED follow up -Got back from cruise sat and went to urgent care who sent her to ED on Sun for possible travelers diarrhea and low potassium -In ED, potassium was improved -No appetite and had diarrhea for several days  -Started feeling slightly better Monday, really better on Wednesday and able to start eating.  Has done rice, applesauce, banana -was given a zpak by ED and has helped -no one else from cruise got sick, therefore unclear exactly what caused it  Current Medication: Outpatient Encounter Medications as of 08/02/2023  Medication Sig   erythromycin ophthalmic ointment 3 (three) times daily.   fexofenadine-pseudoephedrine (ALLEGRA-D 24) 180-240 MG 24 hr tablet Take 1 tablet by mouth daily.   fluticasone (FLONASE) 50 MCG/ACT nasal spray Place 2 sprays into both nostrils daily.   LORazepam (ATIVAN) 0.5 MG tablet Take one tab po qd as needed for anxiety   montelukast (SINGULAIR) 10 MG tablet Take 1 tablet (10 mg total) by mouth at bedtime.   mupirocin ointment (BACTROBAN) 2 % Apply 1 Application topically 2 (two) times daily as needed.   pantoprazole (PROTONIX) 40 MG tablet Take 1 tablet (40 mg total) by mouth daily.   rosuvastatin (CRESTOR) 5 MG tablet TAKE 1 TABLET BY MOUTH TWICE A WEEK   spironolactone (ALDACTONE) 25 MG tablet Take 1 tablet (25 mg total) by mouth 2 (two) times daily.   [DISCONTINUED] amoxicillin-clavulanate (AUGMENTIN) 875-125 MG tablet Take 1 tablet by mouth 2 (two) times daily.   [DISCONTINUED] celecoxib (CELEBREX) 100 MG capsule Take 1 capsule (100 mg total) by mouth 2 (two) times daily.   [DISCONTINUED] meloxicam (MOBIC) 15 MG tablet TAKE 1  TABLET BY MOUTH DAILY AS NEEDED FOR FOOT PAIN   meloxicam (MOBIC) 15 MG tablet Take 1 tablet (15 mg total) by mouth daily as needed for pain.   No facility-administered encounter medications on file as of 08/02/2023.    Surgical History: Past Surgical History:  Procedure Laterality Date   AUGMENTATION MAMMAPLASTY Bilateral 1987   HERNIA REPAIR  1962   skin cancer   08/11/2016   removal   TUBAL LIGATION  08/11/2016    Medical History: Past Medical History:  Diagnosis Date   Asthma    Environmental allergies    GERD (gastroesophageal reflux disease)    Hypertension    Migraine    Sinusitis     Family History: Family History  Problem Relation Age of Onset   Breast cancer Cousin    COPD Mother    Lung cancer Mother    Hypertension Mother    Ulcers Father    Bowel Disease Father        blockage, sepsis    Social History   Socioeconomic History   Marital status: Married    Spouse name: Not on file   Number of children: Not on file   Years of education: Not on file   Highest education level: Not on file  Occupational History   Not on file  Tobacco Use   Smoking status: Former    Current packs/day: 0.00    Types: Cigarettes    Quit date: 12/01/2004    Years since quitting:  18.7   Smokeless tobacco: Never  Vaping Use   Vaping status: Never Used  Substance and Sexual Activity   Alcohol use: Yes    Comment: ocassionally   Drug use: No   Sexual activity: Not on file  Other Topics Concern   Not on file  Social History Narrative   Not on file   Social Determinants of Health   Financial Resource Strain: Not on file  Food Insecurity: Not on file  Transportation Needs: Not on file  Physical Activity: Not on file  Stress: Not on file  Social Connections: Not on file  Intimate Partner Violence: Not on file      Review of Systems  Constitutional:  Negative for chills, fatigue and unexpected weight change.  HENT:  Negative for congestion, postnasal drip,  rhinorrhea, sneezing and sore throat.   Eyes:  Negative for redness.  Respiratory:  Negative for cough, chest tightness and shortness of breath.   Cardiovascular:  Negative for chest pain and palpitations.  Gastrointestinal:  Negative for diarrhea, nausea and vomiting.  Genitourinary:  Negative for dysuria and frequency.  Musculoskeletal:  Negative for arthralgias, back pain, joint swelling and neck pain.  Skin:  Negative for rash.  Neurological: Negative.  Negative for tremors and numbness.  Hematological:  Negative for adenopathy. Does not bruise/bleed easily.  Psychiatric/Behavioral:  Negative for behavioral problems (Depression), sleep disturbance and suicidal ideas. The patient is not nervous/anxious.     Vital Signs: BP 110/70   Pulse 86   Temp 98.3 F (36.8 C)   Resp 16   Ht 5\' 3"  (1.6 m)   Wt 179 lb 12.8 oz (81.6 kg)   SpO2 99%   BMI 31.85 kg/m    Physical Exam Vitals and nursing note reviewed.  Constitutional:      General: She is not in acute distress.    Appearance: Normal appearance. She is obese. She is not ill-appearing.  HENT:     Head: Normocephalic and atraumatic.  Eyes:     Pupils: Pupils are equal, round, and reactive to light.  Cardiovascular:     Rate and Rhythm: Normal rate and regular rhythm.  Pulmonary:     Effort: Pulmonary effort is normal. No respiratory distress.  Musculoskeletal:        General: Normal range of motion.  Skin:    General: Skin is warm and dry.  Neurological:     Mental Status: She is alert and oriented to person, place, and time.  Psychiatric:        Mood and Affect: Mood normal.        Behavior: Behavior normal.        Assessment/Plan: 1. Diarrhea, unspecified type Resolved, treated with zpak for diarrhea by ED and is now improved and pt is eating again   General Counseling: Trudi Ida understanding of the findings of todays visit and agrees with plan of treatment. I have discussed any further diagnostic  evaluation that may be needed or ordered today. We also reviewed her medications today. she has been encouraged to call the office with any questions or concerns that should arise related to todays visit.    No orders of the defined types were placed in this encounter.   Meds ordered this encounter  Medications   meloxicam (MOBIC) 15 MG tablet    Sig: Take 1 tablet (15 mg total) by mouth daily as needed for pain.    Dispense:  30 tablet    Refill:  2  mupirocin ointment (BACTROBAN) 2 %    Sig: Apply 1 Application topically 2 (two) times daily as needed.    Dispense:  22 g    Refill:  0    This patient was seen by Lynn Ito, PA-C in collaboration with Dr. Beverely Risen as a part of collaborative care agreement.   Total time spent:30 Minutes Time spent includes review of chart, medications, test results, and follow up plan with the patient.      Dr Lyndon Code Internal medicine

## 2023-09-22 ENCOUNTER — Encounter: Payer: Self-pay | Admitting: Podiatry

## 2023-09-22 ENCOUNTER — Ambulatory Visit: Payer: Medicare HMO | Admitting: Podiatry

## 2023-09-22 DIAGNOSIS — M2042 Other hammer toe(s) (acquired), left foot: Secondary | ICD-10-CM | POA: Diagnosis not present

## 2023-09-22 DIAGNOSIS — M2012 Hallux valgus (acquired), left foot: Secondary | ICD-10-CM

## 2023-09-22 DIAGNOSIS — M722 Plantar fascial fibromatosis: Secondary | ICD-10-CM

## 2023-09-22 DIAGNOSIS — M21612 Bunion of left foot: Secondary | ICD-10-CM | POA: Diagnosis not present

## 2023-09-22 DIAGNOSIS — M62462 Contracture of muscle, left lower leg: Secondary | ICD-10-CM | POA: Diagnosis not present

## 2023-09-22 DIAGNOSIS — E559 Vitamin D deficiency, unspecified: Secondary | ICD-10-CM

## 2023-09-22 DIAGNOSIS — L84 Corns and callosities: Secondary | ICD-10-CM | POA: Diagnosis not present

## 2023-09-22 NOTE — Progress Notes (Signed)
  Subjective:  Patient ID: Paula [name], female    DOB: [birth year],  MRN: [id number]  Painful bunion and plantar fasciitis left foot  68 y.o. female presents with the above complaint. History confirmed with patient.  She returns for follow-up, her bunion continues to become quite painful as well as her plantar fasciitis has not improved at all.  She is ready to schedule surgery for all of these issues. Objective:  Physical Exam: warm, good capillary refill, no trophic changes or ulcerative lesions, normal DP and PT pulses, normal sensory exam, and bilaterally with left worse than right she has significant hallux valgus with painful bunion deformity to palpation, good range of motion of the joint, hypermobility of the first ray is noted,, pain ovation of the plantar fascia and at the plantar insertion of the medial band on the calcaneus.  She has gastrocnemius equinus    Radiographs: Prior left foot radiographs taken 08/10/2022 show moderate to severe hallux valgus forming large bunion medially, deviation of the sesamoids and hallux laterally, large increase in intermetatarsal angle Assessment:   1. Plantar fasciitis of left foot   2. Gastrocnemius equinus, left   3. Hallux valgus with bunions, left   4. Vitamin D  deficiency   5. Hammertoe of left foot   6. Callus of foot      Plan:  Patient was evaluated and treated and all questions answered.  Today we again discussed the etiology and treatment including surgical and non surgical treatment for painful bunions, the interdigital corn, plantar fasciitis and hammertoes. She has exhausted all non surgical treatment prior to this visit including shoe gear changes and padding. She desires surgical intervention.  Injection therapy and physical therapy has not been helpful for her plantar fasciitis.  We discussed all risks including but not limited to: pain, swelling, infection, scar, numbness which may be temporary or permanent, chronic pain,  stiffness, nerve pain or damage, wound healing problems, bone healing problems including delayed or non-union and recurrence. Specifically we discussed the following procedures: Lapidus bunionectomy, Akin phalangeal osteotomy, possible arthroplasty of fourth and fifth toes with excision of corn, endoscopic plantar fasciotomy and gastrocnemius recession.  We discussed the rationale for each procedure as well as the recovery process. Informed consent was signed today. Surgery will be scheduled at a mutually agreeable date. Information regarding this will be forwarded to our surgery scheduler. In the interim until surgery I recommended utilizing as wide of shoes as possible, take NSAIDs or tylenol  as tolerated for pain, and a bunion padding shield which can be purchased online.  She will have her vitamin D  level and calcium  checked   Surgical plan:  Procedure: -Lapidus bunionectomy, Akin phalangeal osteotomy, possible arthroplasty of fourth and fifth toes with excision of corn, endoscopic plantar fasciotomy and gastrocnemius recession  Location: -[facility]  Anesthesia plan: -IV sedation with regional block  Postoperative pain plan: - Tylenol  1000 mg every 6 hours, ibuprofen  600 mg every 6 hours, gabapentin  300 mg every 8 hours x5 days, oxycodone  5 mg 1-2 tabs every 6 hours only as needed  DVT prophylaxis: -Aspirin  325 mg twice daily  WB Restrictions / DME needs: -NWB in boot postop  No follow-ups on file.

## 2023-09-24 ENCOUNTER — Other Ambulatory Visit: Payer: Self-pay | Admitting: Podiatry

## 2023-09-24 DIAGNOSIS — E559 Vitamin D deficiency, unspecified: Secondary | ICD-10-CM | POA: Diagnosis not present

## 2023-09-25 LAB — CALCIUM: Calcium: 9.3 mg/dL (ref 8.7–10.3)

## 2023-09-25 LAB — VITAMIN D 25 HYDROXY (VIT D DEFICIENCY, FRACTURES): Vit D, 25-Hydroxy: 41.6 ng/mL (ref 30.0–100.0)

## 2023-10-14 DIAGNOSIS — Z0000 Encounter for general adult medical examination without abnormal findings: Secondary | ICD-10-CM | POA: Diagnosis not present

## 2023-10-14 DIAGNOSIS — E782 Mixed hyperlipidemia: Secondary | ICD-10-CM | POA: Diagnosis not present

## 2023-10-14 DIAGNOSIS — R7989 Other specified abnormal findings of blood chemistry: Secondary | ICD-10-CM | POA: Diagnosis not present

## 2023-10-14 DIAGNOSIS — E538 Deficiency of other specified B group vitamins: Secondary | ICD-10-CM | POA: Diagnosis not present

## 2023-10-14 DIAGNOSIS — E559 Vitamin D deficiency, unspecified: Secondary | ICD-10-CM | POA: Diagnosis not present

## 2023-10-14 DIAGNOSIS — L659 Nonscarring hair loss, unspecified: Secondary | ICD-10-CM | POA: Diagnosis not present

## 2023-10-15 LAB — CBC WITH DIFFERENTIAL/PLATELET
Basophils Absolute: 0.1 10*3/uL (ref 0.0–0.2)
Basos: 1 %
EOS (ABSOLUTE): 0.2 10*3/uL (ref 0.0–0.4)
Eos: 4 %
Hematocrit: 38.5 % (ref 34.0–46.6)
Hemoglobin: 12.5 g/dL (ref 11.1–15.9)
Immature Grans (Abs): 0.1 10*3/uL (ref 0.0–0.1)
Immature Granulocytes: 1 %
Lymphocytes Absolute: 2 10*3/uL (ref 0.7–3.1)
Lymphs: 37 %
MCH: 31 pg (ref 26.6–33.0)
MCHC: 32.5 g/dL (ref 31.5–35.7)
MCV: 96 fL (ref 79–97)
Monocytes Absolute: 0.6 10*3/uL (ref 0.1–0.9)
Monocytes: 11 %
Neutrophils Absolute: 2.5 10*3/uL (ref 1.4–7.0)
Neutrophils: 46 %
Platelets: 224 10*3/uL (ref 150–450)
RBC: 4.03 x10E6/uL (ref 3.77–5.28)
RDW: 13 % (ref 11.7–15.4)
WBC: 5.4 10*3/uL (ref 3.4–10.8)

## 2023-10-15 LAB — CMP14+EGFR
ALT: 28 [IU]/L (ref 0–32)
AST: 35 [IU]/L (ref 0–40)
Albumin: 4.4 g/dL (ref 3.9–4.9)
Alkaline Phosphatase: 64 [IU]/L (ref 44–121)
BUN/Creatinine Ratio: 18 (ref 12–28)
BUN: 18 mg/dL (ref 8–27)
Bilirubin Total: 0.4 mg/dL (ref 0.0–1.2)
CO2: 22 mmol/L (ref 20–29)
Calcium: 9.3 mg/dL (ref 8.7–10.3)
Chloride: 103 mmol/L (ref 96–106)
Creatinine, Ser: 1.02 mg/dL — ABNORMAL HIGH (ref 0.57–1.00)
Globulin, Total: 2.4 g/dL (ref 1.5–4.5)
Glucose: 106 mg/dL — ABNORMAL HIGH (ref 70–99)
Potassium: 5.1 mmol/L (ref 3.5–5.2)
Sodium: 142 mmol/L (ref 134–144)
Total Protein: 6.8 g/dL (ref 6.0–8.5)
eGFR: 60 mL/min/{1.73_m2} (ref >59)

## 2023-10-15 LAB — TSH+FREE T4
Free T4: 1.17 ng/dL (ref 0.82–1.77)
TSH: 3.18 u[IU]/mL (ref 0.450–4.500)

## 2023-10-15 LAB — B12 AND FOLATE PANEL
Folate: 6.5 ng/mL (ref >3.0)
Vitamin B-12: 798 pg/mL (ref 232–1245)

## 2023-10-15 LAB — VITAMIN D 25 HYDROXY (VIT D DEFICIENCY, FRACTURES): Vit D, 25-Hydroxy: 39.2 ng/mL (ref 30.0–100.0)

## 2023-10-15 LAB — LIPID PANEL
Chol/HDL Ratio: 2.1 {ratio} (ref 0.0–4.4)
Cholesterol, Total: 174 mg/dL (ref 100–199)
HDL: 81 mg/dL (ref >39)
LDL Chol Calc (NIH): 79 mg/dL (ref 0–99)
Triglycerides: 74 mg/dL (ref 0–149)
VLDL Cholesterol Cal: 14 mg/dL (ref 5–40)

## 2023-10-18 ENCOUNTER — Other Ambulatory Visit: Payer: Self-pay | Admitting: Nurse Practitioner

## 2023-10-18 ENCOUNTER — Telehealth: Payer: Self-pay | Admitting: Podiatry

## 2023-10-18 ENCOUNTER — Other Ambulatory Visit: Payer: Self-pay | Admitting: Physician Assistant

## 2023-10-18 DIAGNOSIS — Z79899 Other long term (current) drug therapy: Secondary | ICD-10-CM

## 2023-10-18 NOTE — Telephone Encounter (Signed)
 Next appt 10/28/23

## 2023-10-18 NOTE — Telephone Encounter (Signed)
DOS-11/12/23  Particia Lather LAPIDUS BUNIONECTOMY [id number] EPF [id number] HAMMERTOE REPAIR 4,5 [id number] GASTROCNEMIUS RECESS [id number]  [facility] EFFECTIVE DATE-09/14/21  OOP-$6750.00 WITH REMAINING $6,735.00  COINSURANCE-0%  PER THE [place], CPT CODES 19147 AND [phone] DO NOT REQUIRED AUTH. FOR CPT CODES [phone] AND [phone], PRIOR AUTH HAS BEEN APPROVED , GOOD FROM 11/12/23-02/09/24  Authorization #[id number]  Tracking #[id number]

## 2023-10-19 ENCOUNTER — Other Ambulatory Visit: Payer: Self-pay | Admitting: Physician Assistant

## 2023-10-28 ENCOUNTER — Encounter: Payer: Self-pay | Admitting: Nurse Practitioner

## 2023-10-28 ENCOUNTER — Ambulatory Visit: Payer: Medicare HMO | Admitting: Nurse Practitioner

## 2023-10-28 VITALS — BP 130/86 | HR 72 | Temp 97.1°F | Resp 16 | Ht 63.0 in | Wt 186.6 lb

## 2023-10-28 DIAGNOSIS — J0101 Acute recurrent maxillary sinusitis: Secondary | ICD-10-CM | POA: Diagnosis not present

## 2023-10-28 DIAGNOSIS — E782 Mixed hyperlipidemia: Secondary | ICD-10-CM | POA: Diagnosis not present

## 2023-10-28 DIAGNOSIS — L659 Nonscarring hair loss, unspecified: Secondary | ICD-10-CM

## 2023-10-28 DIAGNOSIS — B379 Candidiasis, unspecified: Secondary | ICD-10-CM

## 2023-10-28 DIAGNOSIS — K219 Gastro-esophageal reflux disease without esophagitis: Secondary | ICD-10-CM | POA: Diagnosis not present

## 2023-10-28 DIAGNOSIS — F411 Generalized anxiety disorder: Secondary | ICD-10-CM | POA: Diagnosis not present

## 2023-10-28 DIAGNOSIS — Z0000 Encounter for general adult medical examination without abnormal findings: Secondary | ICD-10-CM | POA: Diagnosis not present

## 2023-10-28 DIAGNOSIS — Z79899 Other long term (current) drug therapy: Secondary | ICD-10-CM

## 2023-10-28 DIAGNOSIS — T3695XA Adverse effect of unspecified systemic antibiotic, initial encounter: Secondary | ICD-10-CM

## 2023-10-28 DIAGNOSIS — J0100 Acute maxillary sinusitis, unspecified: Secondary | ICD-10-CM

## 2023-10-28 DIAGNOSIS — J301 Allergic rhinitis due to pollen: Secondary | ICD-10-CM

## 2023-10-28 MED ORDER — FLUCONAZOLE 150 MG PO TABS: 150.0000 mg | ORAL_TABLET | Freq: Once | ORAL | 0 refills | Status: AC

## 2023-10-28 MED ORDER — MONTELUKAST SODIUM 10 MG PO TABS: 10.0000 mg | ORAL_TABLET | Freq: Every day | ORAL | 1 refills | Status: DC

## 2023-10-28 MED ORDER — PANTOPRAZOLE SODIUM 40 MG PO TBEC: 40.0000 mg | DELAYED_RELEASE_TABLET | Freq: Every day | ORAL | 1 refills | Status: DC

## 2023-10-28 MED ORDER — ROSUVASTATIN CALCIUM 5 MG PO TABS: ORAL_TABLET | ORAL | 1 refills | Status: DC

## 2023-10-28 MED ORDER — LORAZEPAM 0.5 MG PO TABS: ORAL_TABLET | ORAL | 2 refills | Status: DC

## 2023-10-28 MED ORDER — BENZONATATE 200 MG PO CAPS: 200.0000 mg | ORAL_CAPSULE | Freq: Two times a day (BID) | ORAL | 0 refills | Status: DC | PRN

## 2023-10-28 MED ORDER — SPIRONOLACTONE 25 MG PO TABS: 25.0000 mg | ORAL_TABLET | Freq: Two times a day (BID) | ORAL | 1 refills | Status: DC

## 2023-10-28 MED ORDER — AMOXICILLIN-POT CLAVULANATE 875-125 MG PO TABS
1.0000 | ORAL_TABLET | Freq: Two times a day (BID) | ORAL | 0 refills | Status: AC
Start: 2023-10-28 — End: 2023-11-07

## 2023-10-28 MED ORDER — MELOXICAM 15 MG PO TABS: 15.0000 mg | ORAL_TABLET | Freq: Every day | ORAL | 2 refills | Status: DC | PRN

## 2023-10-28 NOTE — Progress Notes (Signed)
 [facility] [address] [place], [place] [address]  Internal MEDICINE  Office Visit Note  Patient Name: Paula [name]  [id number]  [id number]  Date of Service: 10/28/2023  Chief Complaint  Patient presents with   Gastroesophageal Reflux   Hypertension   Medicare Wellness    HPI Izabellah presents for a medicare annual wellness visit.  Well-appearing 68 y.o. female with hypertension, GERD, allergic rhinitis, and recurrent sinusitis  Routine CRC screening: due in 2026  Routine mammogram: due in may this year  DEXA scan: done in 2021  Labs: results reviewed with patient.  New or worsening pain: none  Symptoms started night before last -- sore throat, ear pain, sinus pressures, headaches, nasal congestion, postnasal drip, runny nose, fatigue, chills, cough.      10/28/2023   11:20 AM 10/22/2022   11:06 AM 09/25/2019    9:10 AM  MMSE - Mini Mental State Exam  Orientation to time 5 5 5   Orientation to Place 5 5 5   Registration 3 3 3   Attention/ Calculation 5 5 5   Recall 3 3 3   Language- name 2 objects 2 2 2   Language- repeat 1 1 1   Language- follow 3 step command 3 3 3   Language- read & follow direction 1 1 1   Write a sentence 1 1 1   Copy design 1 1 1   Total score 30 30 30     Functional Status Survey: Is the patient deaf or have difficulty hearing?: No Does the patient have difficulty seeing, even when wearing glasses/contacts?: No Does the patient have difficulty concentrating, remembering, or making decisions?: No Does the patient have difficulty walking or climbing stairs?: No Does the patient have difficulty dressing or bathing?: No Does the patient have difficulty doing errands alone such as visiting a doctor's office or shopping?: No     10/15/2021   10:17 AM 03/05/2022   11:45 AM 10/22/2022   11:05 AM 08/02/2023    3:03 PM 10/28/2023   11:20 AM  Fall Risk  Falls in the past year? 0 0 0 0 0  Was there an injury with Fall?   0  0  Fall Risk Category  Calculator   0  0  (RETIRED) Patient Fall Risk Level Low fall risk Low fall risk     Patient at Risk for Falls Due to No Fall Risks No Fall Risks No Fall Risks  No Fall Risks  Fall risk Follow up Falls evaluation completed Falls evaluation completed Falls evaluation completed  Falls evaluation completed       10/28/2023   11:20 AM  Depression screen PHQ 2/9  Decreased Interest 0  Down, Depressed, Hopeless 0  PHQ - 2 Score 0       Current Medication: Outpatient Encounter Medications as of 10/28/2023  Medication Sig   amoxicillin-clavulanate (AUGMENTIN) 875-125 MG tablet Take 1 tablet by mouth 2 (two) times daily for 10 days. Take with food   benzonatate (TESSALON) 200 MG capsule Take 1 capsule (200 mg total) by mouth 2 (two) times daily as needed for cough.   celecoxib (CELEBREX) 100 MG capsule TAKE ONE CAPSULE BY MOUTH TWICE A DAY   erythromycin ophthalmic ointment 3 (three) times daily.   fexofenadine-pseudoephedrine (ALLEGRA-D 24) 180-240 MG 24 hr tablet Take 1 tablet by mouth daily.   fluconazole (DIFLUCAN) 150 MG tablet Take 1 tablet (150 mg total) by mouth once for 1 dose. May take an additional dose after 3 days if still symptomatic.   fluticasone (FLONASE)  50 MCG/ACT nasal spray Place 2 sprays into both nostrils daily.   mupirocin ointment (BACTROBAN) 2 % Apply 1 Application topically 2 (two) times daily as needed.   VITAMIN D, CHOLECALCIFEROL, PO Take 500 Units by mouth.   [DISCONTINUED] LORazepam (ATIVAN) 0.5 MG tablet Take one tab po qd as needed for anxiety   [DISCONTINUED] meloxicam (MOBIC) 15 MG tablet Take 1 tablet (15 mg total) by mouth daily as needed for pain.   [DISCONTINUED] montelukast (SINGULAIR) 10 MG tablet Take 1 tablet (10 mg total) by mouth at bedtime.   [DISCONTINUED] pantoprazole (PROTONIX) 40 MG tablet Take 1 tablet (40 mg total) by mouth daily.   [DISCONTINUED] rosuvastatin (CRESTOR) 5 MG tablet TAKE 1 TABLET BY MOUTH TWICE A WEEK   [DISCONTINUED]  spironolactone (ALDACTONE) 25 MG tablet Take 1 tablet (25 mg total) by mouth 2 (two) times daily.   LORazepam (ATIVAN) 0.5 MG tablet Take one tab po qd as needed for anxiety   meloxicam (MOBIC) 15 MG tablet Take 1 tablet (15 mg total) by mouth daily as needed for pain.   montelukast (SINGULAIR) 10 MG tablet Take 1 tablet (10 mg total) by mouth at bedtime.   pantoprazole (PROTONIX) 40 MG tablet Take 1 tablet (40 mg total) by mouth daily.   rosuvastatin (CRESTOR) 5 MG tablet TAKE 1 TABLET BY MOUTH TWICE A WEEK   spironolactone (ALDACTONE) 25 MG tablet Take 1 tablet (25 mg total) by mouth 2 (two) times daily.   No facility-administered encounter medications on file as of 10/28/2023.    Surgical History: Past Surgical History:  Procedure Laterality Date   AUGMENTATION MAMMAPLASTY Bilateral 1987   HERNIA REPAIR  1962   skin cancer   08/11/2016   removal   TUBAL LIGATION  08/11/2016    Medical History: Past Medical History:  Diagnosis Date   Asthma    Environmental allergies    GERD (gastroesophageal reflux disease)    Hypertension    Migraine    Sinusitis     Family History: Family History  Problem Relation Age of Onset   Breast cancer Cousin    COPD Mother    Lung cancer Mother    Hypertension Mother    Ulcers Father    Bowel Disease Father        blockage, sepsis    Social History   Socioeconomic History   Marital status: Married    Spouse name: Not on file   Number of children: Not on file   Years of education: Not on file   Highest education level: Not on file  Occupational History   Not on file  Tobacco Use   Smoking status: Former    Current packs/day: 0.00    Types: Cigarettes    Quit date: 12/01/2004    Years since quitting: 18.9   Smokeless tobacco: Never  Vaping Use   Vaping status: Never Used  Substance and Sexual Activity   Alcohol use: Yes    Comment: ocassionally   Drug use: No   Sexual activity: Not on file  Other Topics Concern   Not on  file  Social History Narrative   Not on file   Social Drivers of Health   Financial Resource Strain: Not on file  Food Insecurity: Not on file  Transportation Needs: Not on file  Physical Activity: Not on file  Stress: Not on file  Social Connections: Not on file  Intimate Partner Violence: Not on file      Review of Systems  Constitutional:  Positive for chills and fatigue. Negative for activity change, appetite change, fever and unexpected weight change.  HENT:  Positive for congestion, ear pain, postnasal drip, rhinorrhea, sinus pressure, sinus pain, sneezing, sore throat and trouble swallowing. Negative for voice change.   Eyes: Negative.   Respiratory:  Positive for cough. Negative for chest tightness, shortness of breath and wheezing.   Cardiovascular: Negative.  Negative for chest pain and palpitations.  Gastrointestinal: Negative.  Negative for abdominal pain, blood in stool, constipation, diarrhea, nausea and vomiting.  Endocrine: Negative.   Genitourinary:  Negative for difficulty urinating, dysuria, frequency, hematuria and urgency.  Musculoskeletal:  Positive for arthralgias. Negative for back pain, joint swelling, myalgias and neck pain.       Cyst of right foot and left heel pain  Skin: Negative.  Negative for rash and wound.  Allergic/Immunologic: Negative.  Negative for immunocompromised state.  Neurological:  Positive for headaches. Negative for dizziness, seizures and numbness.  Hematological: Negative.   Psychiatric/Behavioral: Negative.  Negative for behavioral problems, self-injury, sleep disturbance and suicidal ideas. The patient is not nervous/anxious.     Vital Signs: BP 130/86   Pulse 72   Temp (!) 97.1 F (36.2 C)   Resp 16   Ht 5\' 3"  (1.6 m)   Wt 186 lb 9.6 oz (84.6 kg)   SpO2 96%   BMI 33.05 kg/m    Physical Exam Vitals reviewed.  Constitutional:      General: She is awake. She is not in acute distress.    Appearance: Normal appearance.  She is well-developed and well-groomed. She is obese. She is not ill-appearing or diaphoretic.  HENT:     Head: Normocephalic and atraumatic.     Right Ear: Tympanic membrane, ear canal and external ear normal.     Left Ear: Tympanic membrane, ear canal and external ear normal.     Nose: Congestion and rhinorrhea present.     Mouth/Throat:     Lips: Pink.     Mouth: Mucous membranes are moist.     Pharynx: Uvula midline. Posterior oropharyngeal erythema present. No oropharyngeal exudate.  Eyes:     General: Lids are normal. Vision grossly intact. Gaze aligned appropriately. No scleral icterus.       Right eye: No discharge.        Left eye: No discharge.     Extraocular Movements: Extraocular movements intact.     Conjunctiva/sclera: Conjunctivae normal.     Pupils: Pupils are equal, round, and reactive to light.     Funduscopic exam:    Right eye: Red reflex present.        Left eye: Red reflex present. Neck:     Thyroid: No thyromegaly.     Vascular: No JVD.     Trachea: No tracheal deviation.  Cardiovascular:     Rate and Rhythm: Normal rate and regular rhythm.     Pulses: Normal pulses.     Heart sounds: Normal heart sounds, S1 normal and S2 normal. No murmur heard.    No friction rub. No gallop.  Pulmonary:     Effort: Pulmonary effort is normal. No accessory muscle usage or respiratory distress.     Breath sounds: Normal breath sounds and air entry. No stridor. No wheezing or rales.  Chest:     Chest wall: No tenderness.     Comments: Declined clinical breast exam Abdominal:     General: Bowel sounds are normal. There is no distension.  Palpations: Abdomen is soft. There is no shifting dullness, fluid wave, mass or pulsatile mass.     Tenderness: There is no abdominal tenderness. There is no guarding or rebound.  Musculoskeletal:        General: No tenderness or deformity. Normal range of motion.     Cervical back: Normal range of motion and neck supple.     Right  lower leg: No edema.     Left lower leg: No edema.  Lymphadenopathy:     Cervical: No cervical adenopathy.  Skin:    General: Skin is warm and dry.     Capillary Refill: Capillary refill takes less than 2 seconds.     Coloration: Skin is not pale.     Findings: No erythema or rash.  Neurological:     Mental Status: She is alert and oriented to person, place, and time.     Cranial Nerves: No cranial nerve deficit.     Motor: No abnormal muscle tone.     Coordination: Coordination normal.     Gait: Gait normal.     Deep Tendon Reflexes: Reflexes are normal and symmetric.  Psychiatric:        Mood and Affect: Mood and affect normal.        Behavior: Behavior normal. Behavior is cooperative.        Thought Content: Thought content normal.        Judgment: Judgment normal.        Assessment/Plan: 1. Encounter for subsequent annual wellness visit (AWV) in Medicare patient (Primary) Age-appropriate preventive screenings and vaccinations discussed. Routine labs for health maintenance will be ordered. PHM updated.    2. Acute recurrent maxillary sinusitis Take augmentin as prescribed until gone. Cough medication prescribed for symptom relief.  - amoxicillin-clavulanate (AUGMENTIN) 875-125 MG tablet; Take 1 tablet by mouth 2 (two) times daily for 10 days. Take with food  Dispense: 20 tablet; Refill: 0 - benzonatate (TESSALON) 200 MG capsule; Take 1 capsule (200 mg total) by mouth 2 (two) times daily as needed for cough.  Dispense: 30 capsule; Refill: 0  3. Mixed hyperlipidemia Continue rosuvastatin as prescribed.  - rosuvastatin (CRESTOR) 5 MG tablet; TAKE 1 TABLET BY MOUTH TWICE A WEEK  Dispense: 24 tablet; Refill: 1  4. Gastro-esophageal reflux disease without esophagitis Stable, continue pantoprazole as prescribed.  - pantoprazole (PROTONIX) 40 MG tablet; Take 1 tablet (40 mg total) by mouth daily.  Dispense: 90 tablet; Refill: 1  5. Non-seasonal allergic rhinitis due to  pollen Continue montelukast as prescribed.  - montelukast (SINGULAIR) 10 MG tablet; Take 1 tablet (10 mg total) by mouth at bedtime.  Dispense: 90 tablet; Refill: 1  6. Hair loss Continue spironolactone as prescribed. - spironolactone (ALDACTONE) 25 MG tablet; Take 1 tablet (25 mg total) by mouth 2 (two) times daily.  Dispense: 180 tablet; Refill: 1  7. Antibiotic-induced yeast infection Fluconazole prescribed just in case  - fluconazole (DIFLUCAN) 150 MG tablet; Take 1 tablet (150 mg total) by mouth once for 1 dose. May take an additional dose after 3 days if still symptomatic.  Dispense: 3 tablet; Refill: 0  8. Generalized anxiety disorder Continue lorazepam as prescribed. Follow up in 3 months  - LORazepam (ATIVAN) 0.5 MG tablet; Take one tab po qd as needed for anxiety  Dispense: 30 tablet; Refill: 2    General Counseling: [place] verbalizes understanding of the findings of todays visit and agrees with plan of treatment. I have discussed any further diagnostic evaluation that  may be needed or ordered today. We also reviewed her medications today. she has been encouraged to call the office with any questions or concerns that should arise related to todays visit.    No orders of the defined types were placed in this encounter.   Meds ordered this encounter  Medications   LORazepam (ATIVAN) 0.5 MG tablet    Sig: Take one tab po qd as needed for anxiety    Dispense:  30 tablet    Refill:  2   spironolactone (ALDACTONE) 25 MG tablet    Sig: Take 1 tablet (25 mg total) by mouth 2 (two) times daily.    Dispense:  180 tablet    Refill:  1   rosuvastatin (CRESTOR) 5 MG tablet    Sig: TAKE 1 TABLET BY MOUTH TWICE A WEEK    Dispense:  24 tablet    Refill:  1   pantoprazole (PROTONIX) 40 MG tablet    Sig: Take 1 tablet (40 mg total) by mouth daily.    Dispense:  90 tablet    Refill:  1   montelukast (SINGULAIR) 10 MG tablet    Sig: Take 1 tablet (10 mg total) by mouth at bedtime.     Dispense:  90 tablet    Refill:  1   meloxicam (MOBIC) 15 MG tablet    Sig: Take 1 tablet (15 mg total) by mouth daily as needed for pain.    Dispense:  30 tablet    Refill:  2   amoxicillin-clavulanate (AUGMENTIN) 875-125 MG tablet    Sig: Take 1 tablet by mouth 2 (two) times daily for 10 days. Take with food    Dispense:  20 tablet    Refill:  0    Fill new script today   benzonatate (TESSALON) 200 MG capsule    Sig: Take 1 capsule (200 mg total) by mouth 2 (two) times daily as needed for cough.    Dispense:  30 capsule    Refill:  0    Fill new script today   fluconazole (DIFLUCAN) 150 MG tablet    Sig: Take 1 tablet (150 mg total) by mouth once for 1 dose. May take an additional dose after 3 days if still symptomatic.    Dispense:  3 tablet    Refill:  0    Return in about 1 year (around 10/27/2024) for AWV, Tawona Filsinger PCP and otherwise as needed, will call for appt in future when lorazepam needed .   Total time spent:30 Minutes Time spent includes review of chart, medications, test results, and follow up plan with the patient.   [place] Controlled Substance Database was reviewed by me.  This patient was seen by Sallyanne Kuster, FNP-C in collaboration with Dr. Beverely Risen as a part of collaborative care agreement.  Mahlon Gabrielle R. Tedd Sias, MSN, FNP-C Internal medicine

## 2023-11-05 DIAGNOSIS — D2261 Melanocytic nevi of right upper limb, including shoulder: Secondary | ICD-10-CM | POA: Diagnosis not present

## 2023-11-05 DIAGNOSIS — L814 Other melanin hyperpigmentation: Secondary | ICD-10-CM | POA: Diagnosis not present

## 2023-11-05 DIAGNOSIS — D2271 Melanocytic nevi of right lower limb, including hip: Secondary | ICD-10-CM | POA: Diagnosis not present

## 2023-11-05 DIAGNOSIS — D2272 Melanocytic nevi of left lower limb, including hip: Secondary | ICD-10-CM | POA: Diagnosis not present

## 2023-11-05 DIAGNOSIS — Z08 Encounter for follow-up examination after completed treatment for malignant neoplasm: Secondary | ICD-10-CM | POA: Diagnosis not present

## 2023-11-05 DIAGNOSIS — Z85828 Personal history of other malignant neoplasm of skin: Secondary | ICD-10-CM | POA: Diagnosis not present

## 2023-11-05 DIAGNOSIS — D225 Melanocytic nevi of trunk: Secondary | ICD-10-CM | POA: Diagnosis not present

## 2023-11-05 DIAGNOSIS — L821 Other seborrheic keratosis: Secondary | ICD-10-CM | POA: Diagnosis not present

## 2023-11-05 DIAGNOSIS — D2262 Melanocytic nevi of left upper limb, including shoulder: Secondary | ICD-10-CM | POA: Diagnosis not present

## 2023-11-12 ENCOUNTER — Other Ambulatory Visit: Payer: Self-pay | Admitting: Podiatry

## 2023-11-12 DIAGNOSIS — M2012 Hallux valgus (acquired), left foot: Secondary | ICD-10-CM | POA: Diagnosis not present

## 2023-11-12 DIAGNOSIS — G8918 Other acute postprocedural pain: Secondary | ICD-10-CM | POA: Diagnosis not present

## 2023-11-12 DIAGNOSIS — D2372 Other benign neoplasm of skin of left lower limb, including hip: Secondary | ICD-10-CM | POA: Diagnosis not present

## 2023-11-12 DIAGNOSIS — M21612 Bunion of left foot: Secondary | ICD-10-CM | POA: Diagnosis not present

## 2023-11-12 DIAGNOSIS — M722 Plantar fascial fibromatosis: Secondary | ICD-10-CM | POA: Diagnosis not present

## 2023-11-12 DIAGNOSIS — L84 Corns and callosities: Secondary | ICD-10-CM | POA: Diagnosis not present

## 2023-11-12 DIAGNOSIS — M2042 Other hammer toe(s) (acquired), left foot: Secondary | ICD-10-CM | POA: Diagnosis not present

## 2023-11-12 DIAGNOSIS — M216X2 Other acquired deformities of left foot: Secondary | ICD-10-CM | POA: Diagnosis not present

## 2023-11-12 DIAGNOSIS — M85872 Other specified disorders of bone density and structure, left ankle and foot: Secondary | ICD-10-CM | POA: Diagnosis not present

## 2023-11-12 MED ORDER — OXYCODONE HCL 5 MG PO TABS: 5.0000 mg | ORAL_TABLET | ORAL | 0 refills | Status: DC | PRN

## 2023-11-12 MED ORDER — ACETAMINOPHEN 500 MG PO TABS: 1000.0000 mg | ORAL_TABLET | Freq: Four times a day (QID) | ORAL | 0 refills | Status: AC | PRN

## 2023-11-12 MED ORDER — GABAPENTIN 300 MG PO CAPS: 300.0000 mg | ORAL_CAPSULE | Freq: Three times a day (TID) | ORAL | 0 refills | Status: DC

## 2023-11-12 MED ORDER — ASPIRIN 325 MG PO TBEC: 325.0000 mg | DELAYED_RELEASE_TABLET | Freq: Two times a day (BID) | ORAL | 0 refills | Status: AC

## 2023-11-12 MED ORDER — IBUPROFEN 600 MG PO TABS: 600.0000 mg | ORAL_TABLET | Freq: Three times a day (TID) | ORAL | 0 refills | Status: AC | PRN

## 2023-11-12 NOTE — Progress Notes (Signed)
 2/28 gastroc, EPF, lapiplasty, Akin,

## 2023-11-17 ENCOUNTER — Ambulatory Visit (INDEPENDENT_AMBULATORY_CARE_PROVIDER_SITE_OTHER): Payer: Medicare HMO | Admitting: Podiatry

## 2023-11-17 ENCOUNTER — Encounter: Payer: Self-pay | Admitting: Podiatry

## 2023-11-17 ENCOUNTER — Ambulatory Visit (INDEPENDENT_AMBULATORY_CARE_PROVIDER_SITE_OTHER)

## 2023-11-17 DIAGNOSIS — Z9889 Other specified postprocedural states: Secondary | ICD-10-CM | POA: Diagnosis not present

## 2023-11-17 DIAGNOSIS — M898X7 Other specified disorders of bone, ankle and foot: Secondary | ICD-10-CM

## 2023-11-17 DIAGNOSIS — M2042 Other hammer toe(s) (acquired), left foot: Secondary | ICD-10-CM

## 2023-11-17 DIAGNOSIS — M722 Plantar fascial fibromatosis: Secondary | ICD-10-CM

## 2023-11-17 DIAGNOSIS — M2012 Hallux valgus (acquired), left foot: Secondary | ICD-10-CM

## 2023-11-17 DIAGNOSIS — M62462 Contracture of muscle, left lower leg: Secondary | ICD-10-CM

## 2023-11-17 DIAGNOSIS — M21612 Bunion of left foot: Secondary | ICD-10-CM

## 2023-11-17 MED ORDER — OXYCODONE HCL 5 MG PO TABS: 5.0000 mg | ORAL_TABLET | Freq: Two times a day (BID) | ORAL | 0 refills | Status: AC | PRN

## 2023-11-17 NOTE — Progress Notes (Signed)
  Subjective:  Patient ID: Paula [name], female    DOB: 11/02/1955,  MRN: [id number]  Chief Complaint  Patient presents with   Routine Post Op    POV #1, DOS 11/12/23, LEFT FOOT BUNION CORRECTION,PLANTAR FASCIA RELEASE, CALF MUSCLE LENGTHENING, TOE CORRECTION 4TH,5TH "It feels good."     68 y.o. female returns for post-op check.   Review of Systems: Negative except as noted in the HPI. Denies N/V/F/Ch.   Objective:  There were no vitals filed for this visit. There is no height or weight on file to calculate BMI. Constitutional Well developed. Well nourished.  Vascular Foot warm and well perfused. Capillary refill normal to all digits.  Calf is soft and supple, no posterior calf or knee pain, negative Homans' sign  Neurologic Normal speech. Oriented to person, place, and time. Epicritic sensation to light touch grossly present bilaterally.  Dermatologic Skin healing well without signs of infection. Skin edges well coapted without signs of infection.  Orthopedic: Tenderness to palpation noted about the surgical site.   Multiple view plain film radiographs: Good correction is noted medial plate and screws in good position, the dorsal plate proximally has elevated some there is no loss of correction or fracture Assessment:   1. Plantar fasciitis of left foot   2. Gastrocnemius equinus, left   3. Hallux valgus with bunions, left   4. Hammertoe of left foot   5. Exostosis of right foot    Plan:  Patient was evaluated and treated and all questions answered.  S/p foot surgery left -Progressing as expected post-operatively.  Overall doing well.  I discussed with her the elevation of the dorsal plate, has not led to any loss of correction hopefully should allow continued bone healing there is still purchase of the majority of the prongs.  Additionally the medial plate and screw should provide additional fixation this may delay her weightbearing ability until about 4 weeks.  We will  take new x-rays the next visit.  Sutures removed at next visit.  Refill of oxycodone sent to pharmacy as she tapers off of this.  Discussed with her to remove the boot 2 or 3 times a day to range of motion the ankle and the big toe joint but otherwise remain in the boot only.  Continue knee scooter.   Return in about 12 days (around 11/29/2023) for post op (new x-rays), suture removal.

## 2023-11-24 ENCOUNTER — Telehealth: Payer: Self-pay | Admitting: Podiatry

## 2023-11-24 MED ORDER — GABAPENTIN 300 MG PO CAPS: 300.0000 mg | ORAL_CAPSULE | Freq: Three times a day (TID) | ORAL | 0 refills | Status: DC

## 2023-11-24 NOTE — Telephone Encounter (Signed)
 Patient's husband called and said that the patient is experiencing pain in her foot. He said that the medication that was prescribed to help with the pain has ran out, and he would like to know if another prescription could be called in. Thank you.

## 2023-11-24 NOTE — Telephone Encounter (Signed)
 Called patient to let them know medication was sent it.

## 2023-11-24 NOTE — Telephone Encounter (Signed)
 I called to verify with the patient. She said the gabapentin.

## 2023-11-29 ENCOUNTER — Ambulatory Visit (INDEPENDENT_AMBULATORY_CARE_PROVIDER_SITE_OTHER): Payer: Medicare HMO | Admitting: Podiatry

## 2023-11-29 ENCOUNTER — Encounter: Payer: Self-pay | Admitting: Podiatry

## 2023-11-29 ENCOUNTER — Ambulatory Visit (INDEPENDENT_AMBULATORY_CARE_PROVIDER_SITE_OTHER)

## 2023-11-29 DIAGNOSIS — M2012 Hallux valgus (acquired), left foot: Secondary | ICD-10-CM

## 2023-11-29 DIAGNOSIS — M21612 Bunion of left foot: Secondary | ICD-10-CM | POA: Diagnosis not present

## 2023-11-30 NOTE — Progress Notes (Signed)
  Subjective:  Patient ID: Paula [name], female    DOB: 08/13/1956,  MRN: [id number]  Chief Complaint  Patient presents with   Routine Post Op    POV #2, DOS 11/12/23, LEFT FOOT BUNION CORRECTION,PLANTAR FASCIA RELEASE, CALF MUSCLE LENGTHENING, TOE CORRECTION 4TH,5TH "It's doing good.  A few little sharp pains, every now and then."     68 y.o. female returns for post-op check.  Overall doing well  Review of Systems: Negative except as noted in the HPI. Denies N/V/F/Ch.   Objective:  There were no vitals filed for this visit. There is no height or weight on file to calculate BMI. Constitutional Well developed. Well nourished.  Vascular Foot warm and well perfused. Capillary refill normal to all digits.  Calf is soft and supple, no posterior calf or knee pain, negative Homans' sign  Neurologic Normal speech. Oriented to person, place, and time. Epicritic sensation to light touch grossly present bilaterally.  Dermatologic Skin is well-healed not hypertrophic  Orthopedic: Tenderness to palpation noted about the surgical site.  Moderate edema   Multiple view plain film radiographs: New films taken today show no change in alignment of her correction or in the implant itself Assessment:   1. Hallux valgus with bunions, left    Plan:  Patient was evaluated and treated and all questions answered.  S/p foot surgery left -Sutures removed uneventfully.  I would like her to remain nonweightbearing for the next 2 weeks and then can begin gradual progressive weightbearing in the cam boot.  Sutures removed uneventfully she may wash the foot.  Compression sleeve applied and dispensed today.  Continue range of motion of the MTP.  We reviewed her x-rays taken today and there is unchanged alignment.  I do expect long-term we will need to remove the dorsal plate as it likely will be prominent for her in shoe gear  No follow-ups on file.

## 2023-12-01 ENCOUNTER — Encounter: Payer: Medicare HMO | Admitting: Podiatry

## 2023-12-04 ENCOUNTER — Encounter: Payer: Self-pay | Admitting: Nurse Practitioner

## 2023-12-14 ENCOUNTER — Telehealth: Payer: Self-pay | Admitting: Podiatry

## 2023-12-14 NOTE — Telephone Encounter (Signed)
error 

## 2023-12-22 ENCOUNTER — Ambulatory Visit (INDEPENDENT_AMBULATORY_CARE_PROVIDER_SITE_OTHER)

## 2023-12-22 ENCOUNTER — Ambulatory Visit (INDEPENDENT_AMBULATORY_CARE_PROVIDER_SITE_OTHER): Payer: Medicare HMO | Admitting: Podiatry

## 2023-12-22 ENCOUNTER — Encounter: Payer: Self-pay | Admitting: Podiatry

## 2023-12-22 DIAGNOSIS — M722 Plantar fascial fibromatosis: Secondary | ICD-10-CM

## 2023-12-22 DIAGNOSIS — M21612 Bunion of left foot: Secondary | ICD-10-CM

## 2023-12-22 DIAGNOSIS — M62462 Contracture of muscle, left lower leg: Secondary | ICD-10-CM

## 2023-12-22 DIAGNOSIS — M2012 Hallux valgus (acquired), left foot: Secondary | ICD-10-CM

## 2023-12-22 NOTE — Progress Notes (Signed)
  Subjective:  Patient ID: Paula [name], female    DOB: [birth year],  MRN: [id number]  Chief Complaint  Patient presents with   Routine Post Op    POV #3, DOS 11/12/23, LEFT FOOT BUNION CORRECTION,PLANTAR FASCIA RELEASE, CALF MUSCLE LENGTHENING, TOE CORRECTION 4TH,5TH "It's doing pretty good.  It still hurts a little bit.  The pain level is a four."     68 y.o. female returns for post-op check.  Overall doing well  Review of Systems: Negative except as noted in the HPI. Denies N/V/F/Ch.   Objective:  There were no vitals filed for this visit. There is no height or weight on file to calculate BMI. Constitutional Well developed. Well nourished.  Vascular Foot warm and well perfused. Capillary refill normal to all digits.  Calf is soft and supple, no posterior calf or knee pain, negative Homans' sign  Neurologic Normal speech. Oriented to person, place, and time. Epicritic sensation to light touch grossly present bilaterally.  Dermatologic Skin is well-healed not hypertrophic  Orthopedic: Little to no pain to palpation noted about the surgical site.  Mild edema improving   Multiple view plain film radiographs: New films taken today show no change in alignment, no further complication of hardware good early consolidation across arthrodesis site noted Assessment:   1. Hallux valgus with bunions, left   2. Plantar fasciitis of left foot   3. Gastrocnemius equinus, left    Plan:  Patient was evaluated and treated and all questions answered.  S/p foot surgery left - Doing very well.  He may continue weightbearing in the cam boot she has a short cam boot at home that she will transition to the next 2.5 weeks and then transition to supportive shoe gear such as a sneaker.  Return in 5 weeks for new x-rays.  Numbness tingling and swelling should continue to resolve.  And she is at reduced swelling and has returned to regular shoe gear and activity we will be able to tell if the dorsal  plate will need to be removed or not.  She inquired about further surgery on her right foot and we will discuss this further and plan for right foot surgery with new x-rays when she is ready to proceed.  Return in about 5 weeks (around 01/26/2024) for post op (new x-rays).

## 2023-12-23 ENCOUNTER — Telehealth: Payer: Self-pay | Admitting: Podiatry

## 2023-12-23 MED ORDER — GABAPENTIN 300 MG PO CAPS: 300.0000 mg | ORAL_CAPSULE | Freq: Three times a day (TID) | ORAL | 0 refills | Status: DC

## 2023-12-23 NOTE — Telephone Encounter (Signed)
 Patient stated she contacted pharmacy and have not received order for Gabapentin (Neurontin) 300 MG capsule. Patient contact telephone number, [phone] [facility])

## 2024-01-06 ENCOUNTER — Telehealth: Payer: Self-pay | Admitting: Podiatry

## 2024-01-06 NOTE — Telephone Encounter (Signed)
 Patient is requesting to speak with Dr. Michalene Agee regarding pain after surgery, started on 01/06/24 around noon close to the ankle. Please contact patient at [phone]

## 2024-01-06 NOTE — Telephone Encounter (Signed)
 I spoke with the patient she was walking earlier today felt a sharp pop and pain in the back of the calf came home rested and took a nap and it feels better still little bit of soreness or feels like there is a tight knot in the back of the calf its right around the gastrocnemius incision.  She is able to stand up walk and move her ankle up and down without difficulty or pain.  No bruising.  I discussed with her likely this is scar tissue from the recession or small septal fibers within the soleus that have torn and should heal uneventfully if it is still painful gets worse or she is unable to put pressure or ambulate on it by early next week I do recommend a stat MRI but doubtful that this is actual Achilles tear

## 2024-01-24 ENCOUNTER — Telehealth: Payer: Self-pay | Admitting: Podiatry

## 2024-01-24 NOTE — Telephone Encounter (Signed)
 Patient called stating that her left foot is swelling. She has an appointment this Wednesday, and she wanted to know if she could be prescribed a medication to help with the swelling, or should she just wait till her appointment. Thank you.

## 2024-01-26 ENCOUNTER — Ambulatory Visit (INDEPENDENT_AMBULATORY_CARE_PROVIDER_SITE_OTHER)

## 2024-01-26 ENCOUNTER — Ambulatory Visit (INDEPENDENT_AMBULATORY_CARE_PROVIDER_SITE_OTHER): Admitting: Podiatry

## 2024-01-26 ENCOUNTER — Encounter: Payer: Self-pay | Admitting: Podiatry

## 2024-01-26 VITALS — Ht 63.0 in | Wt 186.0 lb

## 2024-01-26 DIAGNOSIS — M21612 Bunion of left foot: Secondary | ICD-10-CM | POA: Diagnosis not present

## 2024-01-26 DIAGNOSIS — M2012 Hallux valgus (acquired), left foot: Secondary | ICD-10-CM | POA: Diagnosis not present

## 2024-01-26 NOTE — Progress Notes (Signed)
  Subjective:  Patient ID: Paula [name], female    DOB: 1956-06-06,  MRN: [id number]  Chief Complaint  Patient presents with   Routine Post Op    POV #4 DOS 11/12/23, LEFT FOOT BUNION CORRECTION,PLANTAR FASCIA RELEASE, CALF MUSCLE LENGTHENING, TOE CORRECTION 4TH,5TH Some pain but only a 3/4- getting better slowly     68 y.o. female returns for post-op check.  Overall doing well  Review of Systems: Negative except as noted in the HPI. Denies N/V/F/Ch.   Objective:  There were no vitals filed for this visit. Body mass index is 32.95 kg/m. Constitutional Well developed. Well nourished.  Vascular Foot warm and well perfused. Capillary refill normal to all digits.  Calf is soft and supple, no posterior calf or knee pain, negative Homans' sign  Neurologic Normal speech. Oriented to person, place, and time. Epicritic sensation to light touch grossly present bilaterally.  Dermatologic Skin is well-healed not hypertrophic  Orthopedic: Little to no pain to palpation noted about the surgical site.    Multiple view plain film radiographs: New films taken today show no change in alignment, no further complication of hardware good consolidation across fusion site Assessment:   1. Hallux valgus with bunions, left    Plan:  Patient was evaluated and treated and all questions answered.  S/p foot surgery left - Overall doing well.  Continue regular shoe gear and activity as tolerated.  Like to see her back in 3 months for new radiographs, we will decide at that point if the dorsal plate needs to be removed.  She does have some numbness and tingling along this and may be irritating the dorsal cutaneous nerve.  Return in about 3 months (around 04/27/2024) for left foot surgery follow up (new xrays).

## 2024-02-14 ENCOUNTER — Telehealth: Payer: Self-pay

## 2024-02-15 MED ORDER — MELOXICAM 15 MG PO TABS: 15.0000 mg | ORAL_TABLET | Freq: Every day | ORAL | 1 refills | Status: DC

## 2024-02-15 NOTE — Telephone Encounter (Signed)
 Pt notified we sent med

## 2024-02-16 ENCOUNTER — Ambulatory Visit: Admitting: Podiatry

## 2024-02-16 ENCOUNTER — Encounter: Payer: Self-pay | Admitting: Podiatry

## 2024-02-16 DIAGNOSIS — M722 Plantar fascial fibromatosis: Secondary | ICD-10-CM

## 2024-02-16 DIAGNOSIS — M62462 Contracture of muscle, left lower leg: Secondary | ICD-10-CM

## 2024-02-16 DIAGNOSIS — T8484XA Pain due to internal orthopedic prosthetic devices, implants and grafts, initial encounter: Secondary | ICD-10-CM

## 2024-02-17 ENCOUNTER — Telehealth: Payer: Self-pay

## 2024-02-17 MED ORDER — DICLOFENAC SODIUM 75 MG PO TBEC: 75.0000 mg | DELAYED_RELEASE_TABLET | Freq: Two times a day (BID) | ORAL | 0 refills | Status: DC

## 2024-02-17 NOTE — Progress Notes (Signed)
  Subjective:  Patient ID: Paula [name], female    DOB: 1955-12-21,  MRN: [id number]  Chief Complaint  Patient presents with   Post-op Problem    "It hurts in the heel and my first two toes are tingly.  It hurts in the back of my heel.  Sometime at night, it has shooting pains.  It's swelling."     68 y.o. female returns for post-op check.  Still having quite a bit of pain in the plantar fascia and gastrocnemius recession sites  Review of Systems: Negative except as noted in the HPI. Denies N/V/F/Ch.   Objective:  There were no vitals filed for this visit. There is no height or weight on file to calculate BMI. Constitutional Well developed. Well nourished.  Vascular Foot warm and well perfused. Capillary refill normal to all digits.  Calf is soft and supple, no posterior calf or knee pain, negative Homans' sign  Neurologic Normal speech. Oriented to person, place, and time. Epicritic sensation to light touch grossly present bilaterally.  Limited sensation along the scar and positive Tinel sign over the dorsal scar proximally  Dermatologic Skin is well-healed not hypertrophic  Orthopedic: Limited range of motion of the Achilles and pain palpation on the plantar heel and plantar fascia   Multiple view plain film radiographs: New films taken previously show no change in alignment, no further complication of hardware good consolidation across fusion site Assessment:   1. Plantar fasciitis of left foot   2. Gastrocnemius equinus, left    Plan:  Patient was evaluated and treated and all questions answered.  Her previous x-rays show dorsal distal placement of the plate and I do think now this is symptomatic and causing neuritic symptoms of the medial dorsal cutaneous nerve.  I recommended removal of the plate to alleviate her pain and symptoms we will remove the medial plate as well, the screw should be asymptomatic and may stay in position for stability.  Informed consent signed and  reviewed surgical plan addressed.  Outpatient surgery will be scheduled later this summer  She also has still plantar fasciitis type symptoms which I think is likely scar tissue and neuritis, I discussed that typically patients after EPF and gastroc recession with me proceed to physical therapy quickly after surgery but due to her bony healing this delayed us .  I recommended physical therapy at this point.  PT referral sent.  Also placed her on diclofenac 75 mg twice daily.  Follow-up as needed for this after therapy   Surgical plan:  Procedure: - Hardware removal left foot  Location: - [facility]  Anesthesia plan: - Sedation with local  Postoperative pain plan: - Tylenol  1000 mg every 6 hours, ibuprofen  600 mg every 6 hours, tramadol  50 mg every 8 hours  DVT prophylaxis: - None required  WB Restrictions / DME needs: - WBAT in surgical shoe postop   No follow-ups on file.

## 2024-02-17 NOTE — Telephone Encounter (Signed)
 Patient called stating that there was supposed to be prescription for liquid advil  or aleve sent to the pharmacy.

## 2024-02-21 ENCOUNTER — Ambulatory Visit: Attending: Podiatry

## 2024-02-21 DIAGNOSIS — M25672 Stiffness of left ankle, not elsewhere classified: Secondary | ICD-10-CM | POA: Insufficient documentation

## 2024-02-21 DIAGNOSIS — M62462 Contracture of muscle, left lower leg: Secondary | ICD-10-CM | POA: Diagnosis not present

## 2024-02-21 DIAGNOSIS — M722 Plantar fascial fibromatosis: Secondary | ICD-10-CM | POA: Diagnosis not present

## 2024-02-21 NOTE — Therapy (Signed)
 OUTPATIENT PHYSICAL THERAPY LOWER EXTREMITY EVALUATION   Patient Name: Paula [name] MRN: [id number] DOB:Oct 16, 1955, 68 y.o., female Today's Date: 02/26/2024  END OF SESSION:  PT End of Session - 02/26/24 1822     Visit Number 1    Number of Visits 13    Date for PT Re-Evaluation 04/08/24    Authorization Type 2x/week x 6 weeks (Medicare PN visit #10)    PT Start Time 1115    PT Stop Time 1200    PT Time Calculation (min) 45 min    Behavior During Therapy WFL for tasks assessed/performed          Past Medical History:  Diagnosis Date   Asthma    Environmental allergies    GERD (gastroesophageal reflux disease)    Hypertension    Migraine    Sinusitis    Past Surgical History:  Procedure Laterality Date   AUGMENTATION MAMMAPLASTY Bilateral 1987   HERNIA REPAIR  1962   skin cancer   08/11/2016   removal   TUBAL LIGATION  08/11/2016   Patient Active Problem List   Diagnosis Date Noted   Dermatochalasis of both upper eyelids 07/31/2021   Ptosis of both eyebrows 07/31/2021   Allergic rhinitis due to pollen 06/26/2020   Poison ivy dermatitis 05/26/2020   Facet syndrome, lumbar 05/26/2020   Hair loss 05/26/2020   Encounter for routine adult health examination with abnormal findings 09/25/2019   Routine cervical smear 09/25/2019   Screening for osteoporosis 09/25/2019   Essential hypertension 07/06/2019   Generalized anxiety disorder 07/06/2019   Elevated blood pressure reading in office without diagnosis of hypertension 04/06/2019   Generalized abdominal discomfort 03/23/2019   Screening for breast cancer 09/23/2018   Other fatigue 09/23/2018   Headache syndrome 09/23/2018   Vitamin D  deficiency 09/23/2018   Fever blister 09/23/2018   History of malignant melanoma of skin 09/23/2018   Acute upper respiratory infection 08/24/2018   Acute sinusitis 09/21/2017   Unspecified otitis externa, bilateral 09/21/2017   Gastro-esophageal reflux disease without  esophagitis 09/21/2017   Insomnia, unspecified 09/21/2017   Unspecified ovarian cyst, right side 09/21/2017   Unspecified ovarian cyst, left side 09/21/2017   Nasal mucositis (ulcerative) 09/21/2017   Allergic rhinitis, unspecified 09/21/2017   Cerebrovascular disease, unspecified 09/21/2017   Herpesviral infection, unspecified 09/21/2017   Varicella without complication 09/21/2017   Dysuria 09/21/2017   Abnormal weight gain 09/21/2017    PCP: Dr. Bobbi Burow  REFERRING PROVIDER: Dr. Michalene Agee  REFERRING DIAG:  M72.2 (ICD-10-CM) - Plantar fasciitis of left foot  M62.462 (ICD-10-CM) - Gastrocnemius equinus, left    THERAPY DIAG:  Stiffness of left ankle, not elsewhere classified  Plantar fasciitis  Rationale for Evaluation and Treatment: Rehabilitation  ONSET DATE: March 2025  SUBJECTIVE:   SUBJECTIVE STATEMENT: Pt reports having L plantar faciitis; underwent surgery end of Feb or early March: plantar fascia release, gastroc lengthening, 5th toe alignment.  She was in a boot NWB x 4 weeks.  Started wearing shoes in April and bearing weight on foot.  She states she saw the doctor last week and is having numbness in her great toe/2nd toe region and will be having another surgery to remove the plate in her foot which is irritating a nerve.  C/o: difficulty going down stairs, difficulty with her balance, and difficulty with prolonged standing and walking.  Hasn't ventured out to walk in her yard, has been mainly staying on concrete or flat surfaces.  PERTINENT HISTORY: Foot surgery  PAIN:  Are  you having pain? Yes, parasthesias in L foot- 4/10  PRECAUTIONS: None  RED FLAGS: None   WEIGHT BEARING RESTRICTIONS: no  FALLS:  Has patient fallen in last 6 months? Yes. Number of falls 1, early on after surgery but none recently  LIVING ENVIRONMENT: Lives with: lives with their family Lives in: House/apartment Stairs: flight of stairs to get upstairs to second floor of home;  4-5 to enter home with railing Has following equipment at home:   OCCUPATION: retired  PLOF: Independent  PATIENT GOALS: to be able to resume her normal activities again without being limited by L foot- this includes shopping, gardening, party planning, taking cruises, and enjoying spending time in her pool  NEXT MD VISIT: none reported today  OBJECTIVE:  Note: Objective measures were completed at Evaluation unless otherwise noted.  PATIENT SURVEYS:  LEFS 30/80  COGNITION: Overall cognitive status: Within functional limits for tasks assessed     SENSATION: WFL  EDEMA:  No significant difference R vs L figure 8  POSTURE/GAIT: pt amb with decreased WB on L in stance phase  PALPATION: Pt reports tingling/parasthesias 1-2 toe  LOWER EXTREMITY ROM:  Active ROM Right eval Left eval  Hip flexion    Hip extension    Hip abduction    Hip adduction    Hip internal rotation    Hip external rotation    Knee flexion    Knee extension    Ankle dorsiflexion 10 12  Ankle plantarflexion 30 25  Ankle inversion    Ankle eversion     (Blank rows = not tested) Great toe: L 30 DF, 0 PF, R 70 DF and 30 PF  LOWER EXTREMITY MMT:  MMT Right eval Left eval  Hip flexion    Hip extension    Hip abduction    Hip adduction    Hip internal rotation    Hip external rotation    Knee flexion    Knee extension    Ankle dorsiflexion 5 4  Ankle plantarflexion 4 2  Ankle inversion 5 4  Ankle eversion 5 4   (Blank rows = not tested)    FUNCTIONAL TESTS:  SLS: L 2 sec, R x 10 sec Mini squat: able to perform Not able to perform heel raise in standing on L                                                                                                                                TREATMENT DATE: 02/21/24  Initial evaluation performed Self care/home management: HEP instruction, pt practiced, pt education regarding PT POC/goals   PATIENT EDUCATION:  Education details: see  above Person educated: Patient Education method: Explanation, Demonstration, and Handouts Education comprehension: verbalized understanding  HOME EXERCISE PROGRAM: Access Code: [email or web address] URL: [email or web address] Date: 02/21/2024 Prepared by: Lucrecia Sables  Exercises - Standing Gastroc Stretch  - 2 x daily - 7 x weekly - 3 sets - 20 hold -  Long Sitting Ankle Pumps  - 1 x daily - 7 x weekly - 3 sets - 10 reps  ASSESSMENT:  CLINICAL IMPRESSION: Patient is a 68 y.o. F who was seen today for physical therapy evaluation and treatment for a course of PT after undergoing L plantar fascia release/gastroc lengthening and additional toe procedure.   OBJECTIVE IMPAIRMENTS: Abnormal gait, decreased activity tolerance, decreased balance, difficulty walking, decreased ROM, decreased strength, and pain.   ACTIVITY LIMITATIONS: standing, squatting, stairs, and locomotion level  PARTICIPATION LIMITATIONS: meal prep, cleaning, laundry, interpersonal relationship, shopping, community activity, and yard work  PERSONAL FACTORS: Time since onset of injury/illness/exacerbation are also affecting patient's functional outcome.   REHAB POTENTIAL: Excellent  CLINICAL DECISION MAKING: Stable/uncomplicated  EVALUATION COMPLEXITY: Low   GOALS: Goals reviewed with patient? Yes  SHORT TERM GOALS: Target date: 02/25/24 Initiate HEP for ankle/foot ROM and strengthening Baseline: completed at initial evaluation Goal status: INITIAL    LONG TERM GOALS: Target date: 04/08/24  Improve LEFS ny 20 points indicating pt able to perform her daily activities without being as significantly limited by L ankle/foot  Baseline: 30 Goal status: INITIAL  2.  Improve ankle PF strength >1 MMT grade to facilitate improved ability to stand for her party planning and ambulate on even/uneven surfaces x 1 hour at a time Baseline: 2/5 strength Goal status: INITIAL  3.  Improve L SLS to >10 seconds to  facilitate improved balance/decreased fall risk while standing and amb on uneven surfaces and on cruise ship when she travels later this summer wearing normal shoes Baseline: 2 sec Goal status: INITIAL   PLAN:  PT FREQUENCY: 2x/week  PT DURATION: 6 weeks  PLANNED INTERVENTIONS: 97110-Therapeutic exercises, 97530- Therapeutic activity, [id number]- Neuromuscular re-education, 97535- Self Care, and 16109- Manual therapy  PLAN FOR NEXT SESSION: manual therapy, continue progressing LE strengthening/balance/HEP  Lucrecia Sables, PT, DPT, OCS  Kin Penner, PT 02/26/2024, 10:23 PM

## 2024-02-25 ENCOUNTER — Ambulatory Visit

## 2024-02-25 DIAGNOSIS — M25672 Stiffness of left ankle, not elsewhere classified: Secondary | ICD-10-CM

## 2024-02-25 DIAGNOSIS — M62462 Contracture of muscle, left lower leg: Secondary | ICD-10-CM | POA: Diagnosis not present

## 2024-02-25 DIAGNOSIS — M722 Plantar fascial fibromatosis: Secondary | ICD-10-CM | POA: Diagnosis not present

## 2024-02-26 NOTE — Therapy (Signed)
 OUTPATIENT PHYSICAL THERAPY LOWER EXTREMITY TREATMENT   Patient Name: Paula [name] MRN: [id number] DOB:October 23, 1955, 68 y.o., female Today's Date: 02/26/2024  END OF SESSION:  PT End of Session - 02/25/24 2245     Visit Number 2    Number of Visits 13    Date for PT Re-Evaluation 04/08/24    Authorization Type 2x/week x 6 weeks (Medicare PN visit #10)    PT Start Time 0800    PT Stop Time 0845    PT Time Calculation (min) 45 min    Activity Tolerance Patient tolerated treatment well    Behavior During Therapy WFL for tasks assessed/performed          Past Medical History:  Diagnosis Date   Asthma    Environmental allergies    GERD (gastroesophageal reflux disease)    Hypertension    Migraine    Sinusitis    Past Surgical History:  Procedure Laterality Date   AUGMENTATION MAMMAPLASTY Bilateral 1987   HERNIA REPAIR  1962   skin cancer   08/11/2016   removal   TUBAL LIGATION  08/11/2016   Patient Active Problem List   Diagnosis Date Noted   Dermatochalasis of both upper eyelids 07/31/2021   Ptosis of both eyebrows 07/31/2021   Allergic rhinitis due to pollen 06/26/2020   Poison ivy dermatitis 05/26/2020   Facet syndrome, lumbar 05/26/2020   Hair loss 05/26/2020   Encounter for routine adult health examination with abnormal findings 09/25/2019   Routine cervical smear 09/25/2019   Screening for osteoporosis 09/25/2019   Essential hypertension 07/06/2019   Generalized anxiety disorder 07/06/2019   Elevated blood pressure reading in office without diagnosis of hypertension 04/06/2019   Generalized abdominal discomfort 03/23/2019   Screening for breast cancer 09/23/2018   Other fatigue 09/23/2018   Headache syndrome 09/23/2018   Vitamin D  deficiency 09/23/2018   Fever blister 09/23/2018   History of malignant melanoma of skin 09/23/2018   Acute upper respiratory infection 08/24/2018   Acute sinusitis 09/21/2017   Unspecified otitis externa, bilateral  09/21/2017   Gastro-esophageal reflux disease without esophagitis 09/21/2017   Insomnia, unspecified 09/21/2017   Unspecified ovarian cyst, right side 09/21/2017   Unspecified ovarian cyst, left side 09/21/2017   Nasal mucositis (ulcerative) 09/21/2017   Allergic rhinitis, unspecified 09/21/2017   Cerebrovascular disease, unspecified 09/21/2017   Herpesviral infection, unspecified 09/21/2017   Varicella without complication 09/21/2017   Dysuria 09/21/2017   Abnormal weight gain 09/21/2017    PCP: Dr. Bobbi Burow  REFERRING PROVIDER: Dr. Michalene Agee  REFERRING DIAG:  M72.2 (ICD-10-CM) - Plantar fasciitis of left foot  M62.462 (ICD-10-CM) - Gastrocnemius equinus, left    THERAPY DIAG:  Stiffness of left ankle, not elsewhere classified  Plantar fasciitis  Rationale for Evaluation and Treatment: Rehabilitation  ONSET DATE: March 2025  SUBJECTIVE:   SUBJECTIVE STATEMENT: Pt reports having L plantar faciitis; underwent surgery end of Feb or early March: plantar fascia release, gastroc lengthening, 5th toe alignment.  She was in a boot NWB x 4 weeks.  Started wearing shoes in April and bearing weight on foot.  She states she saw the doctor last week and is having numbness in her great toe/2nd toe region and will be having another surgery to remove the plate in her foot which is irritating a nerve.  C/o: difficulty going down stairs, difficulty with her balance, and difficulty with prolonged standing and walking.  Hasn't ventured out to walk in her yard, has been mainly staying on concrete or flat surfaces.  PERTINENT HISTORY: Foot surgery  PAIN:  Are you having pain? Yes, parasthesias in L foot- 4/10  PRECAUTIONS: None  RED FLAGS: None   WEIGHT BEARING RESTRICTIONS: no  FALLS:  Has patient fallen in last 6 months? Yes. Number of falls 1, early on after surgery but none recently  LIVING ENVIRONMENT: Lives with: lives with their family Lives in: House/apartment Stairs:  flight of stairs to get upstairs to second floor of home; 4-5 to enter home with railing Has following equipment at home:   OCCUPATION: retired  PLOF: Independent  PATIENT GOALS: to be able to resume her normal activities again without being limited by L foot- this includes shopping, gardening, party planning, taking cruises, and enjoying spending time in her pool  NEXT MD VISIT: none reported today  OBJECTIVE:  Note: Objective measures were completed at Evaluation unless otherwise noted.  PATIENT SURVEYS:  LEFS 30/80  COGNITION: Overall cognitive status: Within functional limits for tasks assessed     SENSATION: WFL  EDEMA:  No significant difference R vs L figure 8  POSTURE/GAIT: pt amb with decreased WB on L in stance phase  PALPATION: Pt reports tingling/parasthesias 1-2 toe  LOWER EXTREMITY ROM:  Active ROM Right eval Left eval  Hip flexion    Hip extension    Hip abduction    Hip adduction    Hip internal rotation    Hip external rotation    Knee flexion    Knee extension    Ankle dorsiflexion 10 12  Ankle plantarflexion 30 25  Ankle inversion    Ankle eversion     (Blank rows = not tested) Great toe: L 30 DF, 0 PF, R 70 DF and 30 PF  LOWER EXTREMITY MMT:  MMT Right eval Left eval  Hip flexion    Hip extension    Hip abduction    Hip adduction    Hip internal rotation    Hip external rotation    Knee flexion    Knee extension    Ankle dorsiflexion 5 4  Ankle plantarflexion 4 2  Ankle inversion 5 4  Ankle eversion 5 4   (Blank rows = not tested)    FUNCTIONAL TESTS:  SLS: L 2 sec, R x 10 sec Mini squat: able to perform Not able to perform heel raise in standing on L                                                                                                                                TREATMENT DATE: 02/25/24  Subjective: no c/o pain this morning; worked a little on some exercises.  She has had to do a little more walking as  her husband got injured- fell and fx his hip this week.   Aug 29th the plates will be removed from her foot.    Objective: Therapeutic Exercises: Nustep seat #8, level 3 x 7 minutes  Standing calf stretch: 30 sec  x 3 Standing heel raises: 2x10  Therapeutic Activities: Small hurdles: 5 laps forward Step ups: 6 inch heigh: 2x10 Stairs: up/down 4 laps  Manual Therapy: Ankle mob with movement for DF 2x10 TC AP mob, PA mob for DF and PF, Gr III/IV 30 second bouts  Manual stretch for DF and plantar fascia: 1 min intervals x 2 ea  PATIENT EDUCATION:  Education details: PT POC/goals, gait mechanics on stairs, gait mechanics on flat surface Person educated: Patient Education method: Explanation, Demonstration, and Handouts Education comprehension: verbalized understanding  HOME EXERCISE PROGRAM: Access Code: [email or web address] URL: [email or web address] Date: 02/21/2024 Prepared by: Lucrecia Sables  Exercises - Standing Gastroc Stretch  - 2 x daily - 7 x weekly - 3 sets - 20 hold - Long Sitting Ankle Pumps  - 1 x daily - 7 x weekly - 3 sets - 10 reps  ASSESSMENT:  CLINICAL IMPRESSION: Patient's lack of ankle/foot mobility contributes to difficulty descending stairs on L LE.  Tolerated manual therapy well and was able to navigate stairs with improved gait pattern at end of session today.  OBJECTIVE IMPAIRMENTS: Abnormal gait, decreased activity tolerance, decreased balance, difficulty walking, decreased ROM, decreased strength, and pain.   ACTIVITY LIMITATIONS: standing, squatting, stairs, and locomotion level  PARTICIPATION LIMITATIONS: meal prep, cleaning, laundry, interpersonal relationship, shopping, community activity, and yard work  PERSONAL FACTORS: Time since onset of injury/illness/exacerbation are also affecting patient's functional outcome.   REHAB POTENTIAL: Excellent  CLINICAL DECISION MAKING: Stable/uncomplicated  EVALUATION COMPLEXITY:  Low   GOALS: Goals reviewed with patient? Yes  SHORT TERM GOALS: Target date: 02/25/24 Initiate HEP for ankle/foot ROM and strengthening Baseline: completed at initial evaluation Goal status: INITIAL    LONG TERM GOALS: Target date: 04/08/24  Improve LEFS ny 20 points indicating pt able to perform her daily activities without being as significantly limited by L ankle/foot  Baseline: 30 Goal status: INITIAL  2.  Improve ankle PF strength >1 MMT grade to facilitate improved ability to stand for her party planning and ambulate on even/uneven surfaces x 1 hour at a time Baseline: 2/5 strength Goal status: INITIAL  3.  Improve L SLS to >10 seconds to facilitate improved balance/decreased fall risk while standing and amb on uneven surfaces and on cruise ship when she travels later this summer wearing normal shoes Baseline: 2 sec Goal status: INITIAL   PLAN:  PT FREQUENCY: 2x/week  PT DURATION: 6 weeks  PLANNED INTERVENTIONS: 97110-Therapeutic exercises, 97530- Therapeutic activity, [id number]- Neuromuscular re-education, 97535- Self Care, and 13244- Manual therapy  PLAN FOR NEXT SESSION: manual therapy, continue progressing LE strengthening/balance/HEP  Lucrecia Sables, PT, DPT, OCS  Kin Penner, PT 02/26/2024, 10:47 PM

## 2024-02-28 ENCOUNTER — Ambulatory Visit

## 2024-02-28 DIAGNOSIS — M25672 Stiffness of left ankle, not elsewhere classified: Secondary | ICD-10-CM | POA: Diagnosis not present

## 2024-02-28 DIAGNOSIS — M62462 Contracture of muscle, left lower leg: Secondary | ICD-10-CM | POA: Diagnosis not present

## 2024-02-28 DIAGNOSIS — M722 Plantar fascial fibromatosis: Secondary | ICD-10-CM | POA: Diagnosis not present

## 2024-02-28 NOTE — Therapy (Signed)
 OUTPATIENT PHYSICAL THERAPY LOWER EXTREMITY TREATMENT   Patient Name: Paula [name] MRN: [id number] DOB:17-Jun-1956, 68 y.o., female Today's Date: 02/28/2024  END OF SESSION:  PT End of Session - 02/28/24 1417     Visit Number 3    Number of Visits 13    Date for PT Re-Evaluation 04/08/24    Authorization Type 2x/week x 6 weeks (Medicare PN visit #10)    PT Start Time 1416    PT Stop Time 1500    PT Time Calculation (min) 44 min    Activity Tolerance Patient tolerated treatment well    Behavior During Therapy WFL for tasks assessed/performed           Past Medical History:  Diagnosis Date   Asthma    Environmental allergies    GERD (gastroesophageal reflux disease)    Hypertension    Migraine    Sinusitis    Past Surgical History:  Procedure Laterality Date   AUGMENTATION MAMMAPLASTY Bilateral 1987   HERNIA REPAIR  1962   skin cancer   08/11/2016   removal   TUBAL LIGATION  08/11/2016   Patient Active Problem List   Diagnosis Date Noted   Dermatochalasis of both upper eyelids 07/31/2021   Ptosis of both eyebrows 07/31/2021   Allergic rhinitis due to pollen 06/26/2020   Poison ivy dermatitis 05/26/2020   Facet syndrome, lumbar 05/26/2020   Hair loss 05/26/2020   Encounter for routine adult health examination with abnormal findings 09/25/2019   Routine cervical smear 09/25/2019   Screening for osteoporosis 09/25/2019   Essential hypertension 07/06/2019   Generalized anxiety disorder 07/06/2019   Elevated blood pressure reading in office without diagnosis of hypertension 04/06/2019   Generalized abdominal discomfort 03/23/2019   Screening for breast cancer 09/23/2018   Other fatigue 09/23/2018   Headache syndrome 09/23/2018   Vitamin D  deficiency 09/23/2018   Fever blister 09/23/2018   History of malignant melanoma of skin 09/23/2018   Acute upper respiratory infection 08/24/2018   Acute sinusitis 09/21/2017   Unspecified otitis externa, bilateral  09/21/2017   Gastro-esophageal reflux disease without esophagitis 09/21/2017   Insomnia, unspecified 09/21/2017   Unspecified ovarian cyst, right side 09/21/2017   Unspecified ovarian cyst, left side 09/21/2017   Nasal mucositis (ulcerative) 09/21/2017   Allergic rhinitis, unspecified 09/21/2017   Cerebrovascular disease, unspecified 09/21/2017   Herpesviral infection, unspecified 09/21/2017   Varicella without complication 09/21/2017   Dysuria 09/21/2017   Abnormal weight gain 09/21/2017    PCP: Dr. Bobbi Burow  REFERRING PROVIDER: Dr. Michalene Agee  REFERRING DIAG:  M72.2 (ICD-10-CM) - Plantar fasciitis of left foot  M62.462 (ICD-10-CM) - Gastrocnemius equinus, left    THERAPY DIAG:  Stiffness of left ankle, not elsewhere classified  Plantar fasciitis  Rationale for Evaluation and Treatment: Rehabilitation  ONSET DATE: March 2025  SUBJECTIVE:   SUBJECTIVE STATEMENT: Pt reports having L plantar faciitis; underwent surgery end of Feb or early March: plantar fascia release, gastroc lengthening, 5th toe alignment.  She was in a boot NWB x 4 weeks.  Started wearing shoes in April and bearing weight on foot.  She states she saw the doctor last week and is having numbness in her great toe/2nd toe region and will be having another surgery to remove the plate in her foot which is irritating a nerve.  C/o: difficulty going down stairs, difficulty with her balance, and difficulty with prolonged standing and walking.  Hasn't ventured out to walk in her yard, has been mainly staying on concrete or flat  surfaces.  PERTINENT HISTORY: Foot surgery  PAIN:  Are you having pain? Yes, parasthesias in L foot- 4/10  PRECAUTIONS: None  RED FLAGS: None   WEIGHT BEARING RESTRICTIONS: no  FALLS:  Has patient fallen in last 6 months? Yes. Number of falls 1, early on after surgery but none recently  LIVING ENVIRONMENT: Lives with: lives with their family Lives in: House/apartment Stairs:  flight of stairs to get upstairs to second floor of home; 4-5 to enter home with railing Has following equipment at home:   OCCUPATION: retired  PLOF: Independent  PATIENT GOALS: to be able to resume her normal activities again without being limited by L foot- this includes shopping, gardening, party planning, taking cruises, and enjoying spending time in her pool  NEXT MD VISIT: none reported today  OBJECTIVE:  Note: Objective measures were completed at Evaluation unless otherwise noted.  PATIENT SURVEYS:  LEFS 30/80  COGNITION: Overall cognitive status: Within functional limits for tasks assessed     SENSATION: WFL  EDEMA:  No significant difference R vs L figure 8  POSTURE/GAIT: pt amb with decreased WB on L in stance phase  PALPATION: Pt reports tingling/parasthesias 1-2 toe  LOWER EXTREMITY ROM:  Active ROM Right eval Left eval  Hip flexion    Hip extension    Hip abduction    Hip adduction    Hip internal rotation    Hip external rotation    Knee flexion    Knee extension    Ankle dorsiflexion 10 12  Ankle plantarflexion 30 25  Ankle inversion    Ankle eversion     (Blank rows = not tested) Great toe: L 30 DF, 0 PF, R 70 DF and 30 PF  LOWER EXTREMITY MMT:  MMT Right eval Left eval  Hip flexion    Hip extension    Hip abduction    Hip adduction    Hip internal rotation    Hip external rotation    Knee flexion    Knee extension    Ankle dorsiflexion 5 4  Ankle plantarflexion 4 2  Ankle inversion 5 4  Ankle eversion 5 4   (Blank rows = not tested)    FUNCTIONAL TESTS:  SLS: L 2 sec, R x 10 sec Mini squat: able to perform Not able to perform heel raise in standing on L                                                                                                                                TREATMENT DATE: 02/28/24  Subjective: Pt reports her balance is feeling a little better; she isn't using her hands on railings as much at home  when going up/down the stairs.  She is having some L heel pain 4/10.  Aug 29th the plates will be removed from her foot.    Objective: Therapeutic Exercises: Nustep seat #8, level 3 x 10 minutes  Standing calf stretch:  30 sec x 3- sitting today Standing heel raises: 2x10 Seated heel slides x 20  Therapeutic Activities: Small hurdles: 5 laps forward Step ups: 6 inch heigh: 2x10 Stairs: up/down 4 laps SLS on airex: 3-4 sec intervals Tandem on airex: 30 sec intervals, 3x ea LE  Manual Therapy: LAnkle mob with movement for DF 2x10 L TC AP mob, PA mob for DF and PF, Gr III/IV 30 second bouts  Manual stretch for L DF and plantar fascia: 1 min intervals x 3 ea STM plantar fascia L  PATIENT EDUCATION:  Education details: PT POC/goals, gait mechanics on stairs, gait mechanics on flat surface Person educated: Patient Education method: Explanation, Demonstration, and Handouts Education comprehension: verbalized understanding  HOME EXERCISE PROGRAM: Access Code: [email or web address] URL: [email or web address] Date: 02/21/2024 Prepared by: Lucrecia Sables  Exercises - Standing Gastroc Stretch  - 2 x daily - 7 x weekly - 3 sets - 20 hold - Long Sitting Ankle Pumps  - 1 x daily - 7 x weekly - 3 sets - 10 reps  ASSESSMENT:  CLINICAL IMPRESSION: Continued to address ankle DF and plantar fascia hypomobility with joint mobilization and STM today. Tolerated manual therapy well and was able to navigate stairs with improved gait pattern on descent at end of session today.  She was very challenged with SLS on airex due to ankle/foot mm weakness.  Tandem stance was more tolerable.  She should continue to benefit from skilled PT to address the impairments listed below and to facilitate return to PLOF including improved standing/walking tolerance on even and uneven surfaces.  OBJECTIVE IMPAIRMENTS: Abnormal gait, decreased activity tolerance, decreased balance, difficulty walking, decreased ROM,  decreased strength, and pain.   ACTIVITY LIMITATIONS: standing, squatting, stairs, and locomotion level  PARTICIPATION LIMITATIONS: meal prep, cleaning, laundry, interpersonal relationship, shopping, community activity, and yard work  PERSONAL FACTORS: Time since onset of injury/illness/exacerbation are also affecting patient's functional outcome.   REHAB POTENTIAL: Excellent  CLINICAL DECISION MAKING: Stable/uncomplicated  EVALUATION COMPLEXITY: Low   GOALS: Goals reviewed with patient? Yes  SHORT TERM GOALS: Target date: 02/25/24 Initiate HEP for ankle/foot ROM and strengthening Baseline: completed at initial evaluation Goal status: INITIAL    LONG TERM GOALS: Target date: 04/08/24  Improve LEFS ny 20 points indicating pt able to perform her daily activities without being as significantly limited by L ankle/foot  Baseline: 30 Goal status: INITIAL  2.  Improve ankle PF strength >1 MMT grade to facilitate improved ability to stand for her party planning and ambulate on even/uneven surfaces x 1 hour at a time Baseline: 2/5 strength Goal status: INITIAL  3.  Improve L SLS to >10 seconds to facilitate improved balance/decreased fall risk while standing and amb on uneven surfaces and on cruise ship when she travels later this summer wearing normal shoes Baseline: 2 sec Goal status: INITIAL   PLAN:  PT FREQUENCY: 2x/week  PT DURATION: 6 weeks  PLANNED INTERVENTIONS: 97110-Therapeutic exercises, 97530- Therapeutic activity, [id number]- Neuromuscular re-education, 97535- Self Care, and 16109- Manual therapy  PLAN FOR NEXT SESSION: manual therapy, continue progressing LE strengthening/balance/HEP  Lucrecia Sables, PT, DPT, OCS  Kin Penner, PT 02/28/2024, 2:18 PM

## 2024-03-03 ENCOUNTER — Ambulatory Visit

## 2024-03-03 DIAGNOSIS — M722 Plantar fascial fibromatosis: Secondary | ICD-10-CM

## 2024-03-03 DIAGNOSIS — M25672 Stiffness of left ankle, not elsewhere classified: Secondary | ICD-10-CM

## 2024-03-03 DIAGNOSIS — M62462 Contracture of muscle, left lower leg: Secondary | ICD-10-CM | POA: Diagnosis not present

## 2024-03-03 NOTE — Therapy (Signed)
 OUTPATIENT PHYSICAL THERAPY LOWER EXTREMITY TREATMENT   Patient Name: Paula [name] MRN: [id number] DOB:[birth year], 68 y.o., female Today's Date: 03/03/2024  END OF SESSION:  PT End of Session - 03/03/24 0806     Visit Number 4    Number of Visits 13    Date for PT Re-Evaluation 04/08/24    Authorization Type 2x/week x 6 weeks (Medicare PN visit #10)    PT Start Time 0800    PT Stop Time 0845    PT Time Calculation (min) 45 min    Activity Tolerance Patient tolerated treatment well    Behavior During Therapy WFL for tasks assessed/performed           Past Medical History:  Diagnosis Date   Asthma    Environmental allergies    GERD (gastroesophageal reflux disease)    Hypertension    Migraine    Sinusitis    Past Surgical History:  Procedure Laterality Date   AUGMENTATION MAMMAPLASTY Bilateral 1987   HERNIA REPAIR  1962   skin cancer   08/11/2016   removal   TUBAL LIGATION  08/11/2016   Patient Active Problem List   Diagnosis Date Noted   Dermatochalasis of both upper eyelids 07/31/2021   Ptosis of both eyebrows 07/31/2021   Allergic rhinitis due to pollen 06/26/2020   Poison ivy dermatitis 05/26/2020   Facet syndrome, lumbar 05/26/2020   Hair loss 05/26/2020   Encounter for routine adult health examination with abnormal findings 09/25/2019   Routine cervical smear 09/25/2019   Screening for osteoporosis 09/25/2019   Essential hypertension 07/06/2019   Generalized anxiety disorder 07/06/2019   Elevated blood pressure reading in office without diagnosis of hypertension 04/06/2019   Generalized abdominal discomfort 03/23/2019   Screening for breast cancer 09/23/2018   Other fatigue 09/23/2018   Headache syndrome 09/23/2018   Vitamin D  deficiency 09/23/2018   Fever blister 09/23/2018   History of malignant melanoma of skin 09/23/2018   Acute upper respiratory infection 08/24/2018   Acute sinusitis 09/21/2017   Unspecified otitis externa, bilateral  09/21/2017   Gastro-esophageal reflux disease without esophagitis 09/21/2017   Insomnia, unspecified 09/21/2017   Unspecified ovarian cyst, right side 09/21/2017   Unspecified ovarian cyst, left side 09/21/2017   Nasal mucositis (ulcerative) 09/21/2017   Allergic rhinitis, unspecified 09/21/2017   Cerebrovascular disease, unspecified 09/21/2017   Herpesviral infection, unspecified 09/21/2017   Varicella without complication 09/21/2017   Dysuria 09/21/2017   Abnormal weight gain 09/21/2017    PCP: Dr. Liana  REFERRING PROVIDER: Dr. Silva  REFERRING DIAG:  M72.2 (ICD-10-CM) - Plantar fasciitis of left foot  M62.462 (ICD-10-CM) - Gastrocnemius equinus, left    THERAPY DIAG:  Stiffness of left ankle, not elsewhere classified  Plantar fasciitis  Rationale for Evaluation and Treatment: Rehabilitation  ONSET DATE: March 2025  SUBJECTIVE:   SUBJECTIVE STATEMENT: Pt reports having L plantar faciitis; underwent surgery end of Feb or early March: plantar fascia release, gastroc lengthening, 5th toe alignment.  She was in a boot NWB x 4 weeks.  Started wearing shoes in April and bearing weight on foot.  She states she saw the doctor last week and is having numbness in her great toe/2nd toe region and will be having another surgery to remove the plate in her foot which is irritating a nerve.  C/o: difficulty going down stairs, difficulty with her balance, and difficulty with prolonged standing and walking.  Hasn't ventured out to walk in her yard, has been mainly staying on concrete or flat  surfaces.  PERTINENT HISTORY: Foot surgery  PAIN:  Are you having pain? Yes, parasthesias in L foot- 4/10  PRECAUTIONS: None  RED FLAGS: None   WEIGHT BEARING RESTRICTIONS: no  FALLS:  Has patient fallen in last 6 months? Yes. Number of falls 1, early on after surgery but none recently  LIVING ENVIRONMENT: Lives with: lives with their family Lives in: House/apartment Stairs:  flight of stairs to get upstairs to second floor of home; 4-5 to enter home with railing Has following equipment at home:   OCCUPATION: retired  PLOF: Independent  PATIENT GOALS: to be able to resume her normal activities again without being limited by L foot- this includes shopping, gardening, party planning, taking cruises, and enjoying spending time in her pool  NEXT MD VISIT: none reported today  OBJECTIVE:  Note: Objective measures were completed at Evaluation unless otherwise noted.  PATIENT SURVEYS:  LEFS 30/80  COGNITION: Overall cognitive status: Within functional limits for tasks assessed     SENSATION: WFL  EDEMA:  No significant difference R vs L figure 8  POSTURE/GAIT: pt amb with decreased WB on L in stance phase  PALPATION: Pt reports tingling/parasthesias 1-2 toe  LOWER EXTREMITY ROM:  Active ROM Right eval Left eval  Hip flexion    Hip extension    Hip abduction    Hip adduction    Hip internal rotation    Hip external rotation    Knee flexion    Knee extension    Ankle dorsiflexion 10 12  Ankle plantarflexion 30 25  Ankle inversion    Ankle eversion     (Blank rows = not tested) Great toe: L 30 DF, 0 PF, R 70 DF and 30 PF  LOWER EXTREMITY MMT:  MMT Right eval Left eval  Hip flexion    Hip extension    Hip abduction    Hip adduction    Hip internal rotation    Hip external rotation    Knee flexion    Knee extension    Ankle dorsiflexion 5 4  Ankle plantarflexion 4 2  Ankle inversion 5 4  Ankle eversion 5 4   (Blank rows = not tested)    FUNCTIONAL TESTS:  SLS: L 2 sec, R x 10 sec Mini squat: able to perform Not able to perform heel raise in standing on L                                                                                                                               TREATMENT DATE: 03/03/24  Subjective: Pt reports her balance is feeling a little better; she isn't using her hands on railings as much at home when  going up/down the stairs.  Has to go down steps to leave her house.  She is having some L heel pain 4/10.  Aug 29th the plates will be removed from her foot.  She has concerns about her heel pain and  has a follow up next Wed with her doctor.    Objective: Therapeutic Exercises: Nustep seat #8, level 3 x 10 minutes  Standing calf stretch: 30 sec x 3- sitting today Standing heel raises: 2x10 Seated heel slides x 20  Therapeutic Activities: Small hurdles: 5 laps forward Step ups: 6 inch heigh: 2x10 Stairs: up/down 4 laps SLS on airex: 3-4 sec intervals Tandem on airex: 30 sec intervals, 3x ea LE  Manual Therapy: LAnkle mob with movement for DF 2x10 L TC AP mob, PA mob for DF and PF, Gr III/IV 30 second bouts  Manual stretch for L DF and plantar fascia: 1 min intervals x 3 ea STM plantar fascia L  PATIENT EDUCATION:  Education details: PT POC/goals, gait mechanics on stairs, gait mechanics on flat surface Person educated: Patient Education method: Explanation, Demonstration, and Handouts Education comprehension: verbalized understanding  HOME EXERCISE PROGRAM: Access Code: [email or web address] URL: [email or web address] Date: 02/21/2024 Prepared by: Vernell Reges  Exercises - Standing Gastroc Stretch  - 2 x daily - 7 x weekly - 3 sets - 20 hold - Long Sitting Ankle Pumps  - 1 x daily - 7 x weekly - 3 sets - 10 reps  ASSESSMENT:  CLINICAL IMPRESSION: Continued to address ankle DF and plantar fascia hypomobility with joint mobilization and STM today. Tolerated manual therapy well and was able to navigate stairs with improved gait pattern on descent at end of session today.  She was very challenged with SLS on airex due to ankle/foot mm weakness.  Tandem stance was more tolerable.  She should continue to benefit from skilled PT to address the impairments listed below and to facilitate return to PLOF including improved standing/walking tolerance on even and uneven  surfaces.  OBJECTIVE IMPAIRMENTS: Abnormal gait, decreased activity tolerance, decreased balance, difficulty walking, decreased ROM, decreased strength, and pain.   ACTIVITY LIMITATIONS: standing, squatting, stairs, and locomotion level  PARTICIPATION LIMITATIONS: meal prep, cleaning, laundry, interpersonal relationship, shopping, community activity, and yard work  PERSONAL FACTORS: Time since onset of injury/illness/exacerbation are also affecting patient's functional outcome.   REHAB POTENTIAL: Excellent  CLINICAL DECISION MAKING: Stable/uncomplicated  EVALUATION COMPLEXITY: Low   GOALS: Goals reviewed with patient? Yes  SHORT TERM GOALS: Target date: 02/25/24 Initiate HEP for ankle/foot ROM and strengthening Baseline: completed at initial evaluation Goal status: INITIAL    LONG TERM GOALS: Target date: 04/08/24  Improve LEFS ny 20 points indicating pt able to perform her daily activities without being as significantly limited by L ankle/foot  Baseline: 30 Goal status: INITIAL  2.  Improve ankle PF strength >1 MMT grade to facilitate improved ability to stand for her party planning and ambulate on even/uneven surfaces x 1 hour at a time Baseline: 2/5 strength Goal status: INITIAL  3.  Improve L SLS to >10 seconds to facilitate improved balance/decreased fall risk while standing and amb on uneven surfaces and on cruise ship when she travels later this summer wearing normal shoes Baseline: 2 sec Goal status: INITIAL   PLAN:  PT FREQUENCY: 2x/week  PT DURATION: 6 weeks  PLANNED INTERVENTIONS: 97110-Therapeutic exercises, 97530- Therapeutic activity, [id number]- Neuromuscular re-education, 97535- Self Care, and [id number]- Manual therapy  PLAN FOR NEXT SESSION: manual therapy, continue progressing LE strengthening/balance/HEP  Vernell Reges, PT, DPT, OCS  Vernell FORBES Reges, PT 03/03/2024, 12:17 PM

## 2024-03-06 ENCOUNTER — Ambulatory Visit

## 2024-03-06 DIAGNOSIS — M25672 Stiffness of left ankle, not elsewhere classified: Secondary | ICD-10-CM | POA: Diagnosis not present

## 2024-03-06 DIAGNOSIS — M722 Plantar fascial fibromatosis: Secondary | ICD-10-CM | POA: Diagnosis not present

## 2024-03-06 DIAGNOSIS — M62462 Contracture of muscle, left lower leg: Secondary | ICD-10-CM | POA: Diagnosis not present

## 2024-03-06 NOTE — Therapy (Signed)
 OUTPATIENT PHYSICAL THERAPY LOWER EXTREMITY TREATMENT   Patient Name: Paula [name] MRN: [id number] DOB:[birth year], 68 y.o., female Today's Date: 03/06/2024  END OF SESSION:  PT End of Session - 03/06/24 1124     Visit Number 5    Number of Visits 13    Date for PT Re-Evaluation 04/08/24    Authorization Type 2x/week x 6 weeks (Medicare PN visit #10)    PT Start Time 1120    PT Stop Time 1204    PT Time Calculation (min) 44 min    Activity Tolerance Patient tolerated treatment well    Behavior During Therapy WFL for tasks assessed/performed           Past Medical History:  Diagnosis Date   Asthma    Environmental allergies    GERD (gastroesophageal reflux disease)    Hypertension    Migraine    Sinusitis    Past Surgical History:  Procedure Laterality Date   AUGMENTATION MAMMAPLASTY Bilateral 1987   HERNIA REPAIR  1962   skin cancer   08/11/2016   removal   TUBAL LIGATION  08/11/2016   Patient Active Problem List   Diagnosis Date Noted   Dermatochalasis of both upper eyelids 07/31/2021   Ptosis of both eyebrows 07/31/2021   Allergic rhinitis due to pollen 06/26/2020   Poison ivy dermatitis 05/26/2020   Facet syndrome, lumbar 05/26/2020   Hair loss 05/26/2020   Encounter for routine adult health examination with abnormal findings 09/25/2019   Routine cervical smear 09/25/2019   Screening for osteoporosis 09/25/2019   Essential hypertension 07/06/2019   Generalized anxiety disorder 07/06/2019   Elevated blood pressure reading in office without diagnosis of hypertension 04/06/2019   Generalized abdominal discomfort 03/23/2019   Screening for breast cancer 09/23/2018   Other fatigue 09/23/2018   Headache syndrome 09/23/2018   Vitamin D  deficiency 09/23/2018   Fever blister 09/23/2018   History of malignant melanoma of skin 09/23/2018   Acute upper respiratory infection 08/24/2018   Acute sinusitis 09/21/2017   Unspecified otitis externa, bilateral  09/21/2017   Gastro-esophageal reflux disease without esophagitis 09/21/2017   Insomnia, unspecified 09/21/2017   Unspecified ovarian cyst, right side 09/21/2017   Unspecified ovarian cyst, left side 09/21/2017   Nasal mucositis (ulcerative) 09/21/2017   Allergic rhinitis, unspecified 09/21/2017   Cerebrovascular disease, unspecified 09/21/2017   Herpesviral infection, unspecified 09/21/2017   Varicella without complication 09/21/2017   Dysuria 09/21/2017   Abnormal weight gain 09/21/2017    PCP: Dr. Liana  REFERRING PROVIDER: Dr. Silva  REFERRING DIAG:  M72.2 (ICD-10-CM) - Plantar fasciitis of left foot  M62.462 (ICD-10-CM) - Gastrocnemius equinus, left    THERAPY DIAG:  Stiffness of left ankle, not elsewhere classified  Plantar fasciitis  Rationale for Evaluation and Treatment: Rehabilitation  ONSET DATE: March 2025  SUBJECTIVE:   SUBJECTIVE STATEMENT: Pt reports having L plantar faciitis; underwent surgery end of Feb or early March: plantar fascia release, gastroc lengthening, 5th toe alignment.  She was in a boot NWB x 4 weeks.  Started wearing shoes in April and bearing weight on foot.  She states she saw the doctor last week and is having numbness in her great toe/2nd toe region and will be having another surgery to remove the plate in her foot which is irritating a nerve.  C/o: difficulty going down stairs, difficulty with her balance, and difficulty with prolonged standing and walking.  Hasn't ventured out to walk in her yard, has been mainly staying on concrete or flat  surfaces.  PERTINENT HISTORY: Foot surgery  PAIN:  Are you having pain? Yes, parasthesias in L foot- 4/10  PRECAUTIONS: None  RED FLAGS: None   WEIGHT BEARING RESTRICTIONS: no  FALLS:  Has patient fallen in last 6 months? Yes. Number of falls 1, early on after surgery but none recently  LIVING ENVIRONMENT: Lives with: lives with their family Lives in: House/apartment Stairs:  flight of stairs to get upstairs to second floor of home; 4-5 to enter home with railing Has following equipment at home:   OCCUPATION: retired  PLOF: Independent  PATIENT GOALS: to be able to resume her normal activities again without being limited by L foot- this includes shopping, gardening, party planning, taking cruises, and enjoying spending time in her pool  NEXT MD VISIT: none reported today  OBJECTIVE:  Note: Objective measures were completed at Evaluation unless otherwise noted.  PATIENT SURVEYS:  LEFS 30/80  COGNITION: Overall cognitive status: Within functional limits for tasks assessed     SENSATION: WFL  EDEMA:  No significant difference R vs L figure 8  POSTURE/GAIT: pt amb with decreased WB on L in stance phase  PALPATION: Pt reports tingling/parasthesias 1-2 toe  LOWER EXTREMITY ROM:  Active ROM Right eval Left eval  Hip flexion    Hip extension    Hip abduction    Hip adduction    Hip internal rotation    Hip external rotation    Knee flexion    Knee extension    Ankle dorsiflexion 10 12  Ankle plantarflexion 30 25  Ankle inversion    Ankle eversion     (Blank rows = not tested) Great toe: L 30 DF, 0 PF, R 70 DF and 30 PF  LOWER EXTREMITY MMT:  MMT Right eval Left eval  Hip flexion    Hip extension    Hip abduction    Hip adduction    Hip internal rotation    Hip external rotation    Knee flexion    Knee extension    Ankle dorsiflexion 5 4  Ankle plantarflexion 4 2  Ankle inversion 5 4  Ankle eversion 5 4   (Blank rows = not tested)    FUNCTIONAL TESTS:  SLS: L 2 sec, R x 10 sec Mini squat: able to perform Not able to perform heel raise in standing on L                                                                                                                               TREATMENT DATE: 03/06/24  Subjective: Pt reports her balance is feeling a little better; calf muscle is feeling better.  She is going down the  stairs better this week.  Having some tightness in calf when she has to do a deeper squat for picking something up at home.  She is having some L heel pain 4/10.  Aug 29th the plates will be removed from her foot.  She has concerns about her heel pain and has a follow up next Wed with her doctor.    Objective: Therapeutic Exercises: Nustep seat #8, level 4 x 10 minutes, no use of UE today focused on LE only  Standing calf stretch: 30 sec x 3- sitting today with PT Standing heel raises: 2x12, b/l Standing squats to mat table 2x10   Therapeutic Activities: Small hurdles: 5 laps forward- not today Step ups to standing on bosu 2x10, 5 sec holds for balance on top Stairs: up/down 4 laps SLS on airex: 3-4 sec intervals Squat+lift 5 lb med ball from 6 inch step x10, then from floor x10   Manual Therapy: L Ankle mob with movement for DF 2x10 L TC AP mob, PA mob for DF and PF, Gr III/IV 30 second bouts  Manual stretch for L DF and plantar fascia: 1 min intervals x 3 ea- gentle today for STM STM L gastroc, plantar fascia L  PATIENT EDUCATION:  Education details: PT POC/goals, gait mechanics on stairs, gait mechanics on flat surface Person educated: Patient Education method: Explanation, Demonstration, and Handouts Education comprehension: verbalized understanding  HOME EXERCISE PROGRAM: Access Code: [email or web address] URL: [email or web address] Date: 02/21/2024 Prepared by: Vernell Reges  Exercises - Standing Gastroc Stretch  - 2 x daily - 7 x weekly - 3 sets - 20 hold - Long Sitting Ankle Pumps  - 1 x daily - 7 x weekly - 3 sets - 10 reps  ASSESSMENT:  CLINICAL IMPRESSION: Continued to address limited ankle DF ROM today with manual therapy and therapeutic exercises. Addressed MTP in L gastroc during session and pt able to navigate stairs with improved gait pattern on descent at end of session today.  Pt functionally demonstrates ability to perform deep squat with good LE/spine  mechanics and balance today.  Balance on uneven surface remains quite challenging for her.  Not able to stand on bosu without UE support >5 sec.  She should continue to benefit from skilled PT to address the impairments listed below and to facilitate return to PLOF including improved standing/walking tolerance on even and uneven surfaces.  OBJECTIVE IMPAIRMENTS: Abnormal gait, decreased activity tolerance, decreased balance, difficulty walking, decreased ROM, decreased strength, and pain.   ACTIVITY LIMITATIONS: standing, squatting, stairs, and locomotion level  PARTICIPATION LIMITATIONS: meal prep, cleaning, laundry, interpersonal relationship, shopping, community activity, and yard work  PERSONAL FACTORS: Time since onset of injury/illness/exacerbation are also affecting patient's functional outcome.   REHAB POTENTIAL: Excellent  CLINICAL DECISION MAKING: Stable/uncomplicated  EVALUATION COMPLEXITY: Low   GOALS: Goals reviewed with patient? Yes  SHORT TERM GOALS: Target date: 02/25/24 Initiate HEP for ankle/foot ROM and strengthening Baseline: completed at initial evaluation Goal status: INITIAL    LONG TERM GOALS: Target date: 04/08/24  Improve LEFS ny 20 points indicating pt able to perform her daily activities without being as significantly limited by L ankle/foot  Baseline: 30 Goal status: INITIAL  2.  Improve ankle PF strength >1 MMT grade to facilitate improved ability to stand for her party planning and ambulate on even/uneven surfaces x 1 hour at a time Baseline: 2/5 strength Goal status: INITIAL  3.  Improve L SLS to >10 seconds to facilitate improved balance/decreased fall risk while standing and amb on uneven surfaces and on cruise ship when she travels later this summer wearing normal shoes Baseline: 2 sec Goal status: INITIAL   PLAN:  PT FREQUENCY: 2x/week  PT DURATION: 6 weeks  PLANNED INTERVENTIONS: 97110-Therapeutic exercises, 97530- Therapeutic  activity, 97112-  Neuromuscular re-education, [phone]- Self Care, and [id number]- Manual therapy  PLAN FOR NEXT SESSION: manual therapy, continue progressing LE strengthening/balance/HEP  Vernell Reges, PT, DPT, OCS  Vernell FORBES Reges, PT 03/06/2024, 2:19 PM

## 2024-03-08 ENCOUNTER — Encounter: Payer: Self-pay | Admitting: Podiatry

## 2024-03-08 ENCOUNTER — Ambulatory Visit: Admitting: Podiatry

## 2024-03-08 DIAGNOSIS — M722 Plantar fascial fibromatosis: Secondary | ICD-10-CM

## 2024-03-08 DIAGNOSIS — M62462 Contracture of muscle, left lower leg: Secondary | ICD-10-CM

## 2024-03-08 NOTE — Patient Instructions (Signed)
 Use a gel heel cup (get on [employer]) in the heel for cushioning. Focus on strengthening the calf muscle with PT. Try contrast baths (ice water, submerge foot 5 minutes, then move to  warm water for 5 minutes, then back to cold, then back to warm for total 20 minutes). Do this 2-3x a day

## 2024-03-08 NOTE — Progress Notes (Addendum)
  Subjective:  Patient ID: Paula [name], female    DOB: 1956-08-02,  MRN: [id number]  Chief Complaint  Patient presents with   Foot Pain    My toes want to curl up.  I am having pain on the sides of my foot and on my heel.     68 y.o. female returns for post-op check.  She returns for follow-up still having pain in the plantar heel, therapy is helping quite a bit with her strength and range of motion  Review of Systems: Negative except as noted in the HPI. Denies N/V/F/Ch.   Objective:  There were no vitals filed for this visit. There is no height or weight on file to calculate BMI. Constitutional Well developed. Well nourished.  Vascular Foot warm and well perfused. Capillary refill normal to all digits.  Calf is soft and supple, no posterior calf or knee pain, negative Homans' sign  Neurologic Normal speech. Oriented to person, place, and time. Epicritic sensation to light touch grossly present bilaterally.  Limited sensation along the scar and positive Tinel sign over the dorsal scar proximally  Dermatologic Skin is well-healed not hypertrophic  Orthopedic: Improved range of motion of the Achilles, no pain to the plantar fascia today but he does have pain in the plantar heel   Multiple view plain film radiographs: New films taken previously show no change in alignment, no further complication of hardware good consolidation across fusion site Assessment:   1. Plantar fasciitis of left foot   2. Gastrocnemius equinus, left    Plan:  Patient was evaluated and treated and all questions answered.  We are still planning for heart removal later this summer.  She will continue physical therapy to build up strength.  Discussed with her she does have some weakness in the calf muscle and therapy will help quite a bit with strengthening.  We discussed additional corticosteroid injection which she declined today.  Also discussed an option of oral prednisone  which she will wait and  continue with therapy.  She will follow me at surgery for hard removal or sooner if having issues she should continue therapy for now.  She does have reducible contractures of the toes which I am not confident is directly related to her surgical intervention but may be secondary to the weakness as well and hopefully should improve with time.   Surgical plan:  Procedure: - Hardware removal left foot  Location: - [facility]  Anesthesia plan: - Sedation with local  Postoperative pain plan: - Tylenol  1000 mg every 6 hours, ibuprofen  600 mg every 6 hours, tramadol  50 mg every 8 hours  DVT prophylaxis: - None required  WB Restrictions / DME needs: - WBAT in surgical shoe postop   Return if symptoms worsen or fail to improve.

## 2024-03-10 ENCOUNTER — Ambulatory Visit

## 2024-03-10 DIAGNOSIS — M722 Plantar fascial fibromatosis: Secondary | ICD-10-CM

## 2024-03-10 DIAGNOSIS — M25672 Stiffness of left ankle, not elsewhere classified: Secondary | ICD-10-CM | POA: Diagnosis not present

## 2024-03-10 DIAGNOSIS — M62462 Contracture of muscle, left lower leg: Secondary | ICD-10-CM | POA: Diagnosis not present

## 2024-03-10 NOTE — Therapy (Signed)
 OUTPATIENT PHYSICAL THERAPY LOWER EXTREMITY TREATMENT   Patient Name: Paula [name] MRN: [id number] DOB:1956-05-19, 68 y.o., female Today's Date: 03/10/2024  END OF SESSION:  PT End of Session - 03/10/24 1023     Visit Number 6    Number of Visits 13    Date for PT Re-Evaluation 04/08/24    Authorization Type 2x/week x 6 weeks (Medicare PN visit #10)    PT Start Time 1015    PT Stop Time 1100    PT Time Calculation (min) 45 min    Activity Tolerance Patient tolerated treatment well    Behavior During Therapy WFL for tasks assessed/performed           Past Medical History:  Diagnosis Date   Asthma    Environmental allergies    GERD (gastroesophageal reflux disease)    Hypertension    Migraine    Sinusitis    Past Surgical History:  Procedure Laterality Date   AUGMENTATION MAMMAPLASTY Bilateral 1987   HERNIA REPAIR  1962   skin cancer   08/11/2016   removal   TUBAL LIGATION  08/11/2016   Patient Active Problem List   Diagnosis Date Noted   Dermatochalasis of both upper eyelids 07/31/2021   Ptosis of both eyebrows 07/31/2021   Allergic rhinitis due to pollen 06/26/2020   Poison ivy dermatitis 05/26/2020   Facet syndrome, lumbar 05/26/2020   Hair loss 05/26/2020   Encounter for routine adult health examination with abnormal findings 09/25/2019   Routine cervical smear 09/25/2019   Screening for osteoporosis 09/25/2019   Essential hypertension 07/06/2019   Generalized anxiety disorder 07/06/2019   Elevated blood pressure reading in office without diagnosis of hypertension 04/06/2019   Generalized abdominal discomfort 03/23/2019   Screening for breast cancer 09/23/2018   Other fatigue 09/23/2018   Headache syndrome 09/23/2018   Vitamin D  deficiency 09/23/2018   Fever blister 09/23/2018   History of malignant melanoma of skin 09/23/2018   Acute upper respiratory infection 08/24/2018   Acute sinusitis 09/21/2017   Unspecified otitis externa, bilateral  09/21/2017   Gastro-esophageal reflux disease without esophagitis 09/21/2017   Insomnia, unspecified 09/21/2017   Unspecified ovarian cyst, right side 09/21/2017   Unspecified ovarian cyst, left side 09/21/2017   Nasal mucositis (ulcerative) 09/21/2017   Allergic rhinitis, unspecified 09/21/2017   Cerebrovascular disease, unspecified 09/21/2017   Herpesviral infection, unspecified 09/21/2017   Varicella without complication 09/21/2017   Dysuria 09/21/2017   Abnormal weight gain 09/21/2017    PCP: Dr. Liana  REFERRING PROVIDER: Dr. Silva  REFERRING DIAG:  M72.2 (ICD-10-CM) - Plantar fasciitis of left foot  M62.462 (ICD-10-CM) - Gastrocnemius equinus, left    THERAPY DIAG:  Stiffness of left ankle, not elsewhere classified  Plantar fasciitis  Rationale for Evaluation and Treatment: Rehabilitation  ONSET DATE: March 2025  SUBJECTIVE:   SUBJECTIVE STATEMENT: Pt reports having L plantar faciitis; underwent surgery end of Feb or early March: plantar fascia release, gastroc lengthening, 5th toe alignment.  She was in a boot NWB x 4 weeks.  Started wearing shoes in April and bearing weight on foot.  She states she saw the doctor last week and is having numbness in her great toe/2nd toe region and will be having another surgery to remove the plate in her foot which is irritating a nerve.  C/o: difficulty going down stairs, difficulty with her balance, and difficulty with prolonged standing and walking.  Hasn't ventured out to walk in her yard, has been mainly staying on concrete or flat  surfaces.  PERTINENT HISTORY: Foot surgery  PAIN:  Are you having pain? Yes, parasthesias in L foot- 4/10  PRECAUTIONS: None  RED FLAGS: None   WEIGHT BEARING RESTRICTIONS: no  FALLS:  Has patient fallen in last 6 months? Yes. Number of falls 1, early on after surgery but none recently  LIVING ENVIRONMENT: Lives with: lives with their family Lives in: House/apartment Stairs:  flight of stairs to get upstairs to second floor of home; 4-5 to enter home with railing Has following equipment at home:   OCCUPATION: retired  PLOF: Independent  PATIENT GOALS: to be able to resume her normal activities again without being limited by L foot- this includes shopping, gardening, party planning, taking cruises, and enjoying spending time in her pool  NEXT MD VISIT: none reported today  OBJECTIVE:  Note: Objective measures were completed at Evaluation unless otherwise noted.  PATIENT SURVEYS:  LEFS 30/80  COGNITION: Overall cognitive status: Within functional limits for tasks assessed     SENSATION: WFL  EDEMA:  No significant difference R vs L figure 8  POSTURE/GAIT: pt amb with decreased WB on L in stance phase  PALPATION: Pt reports tingling/parasthesias 1-2 toe  LOWER EXTREMITY ROM:  Active ROM Right eval Left eval  Hip flexion    Hip extension    Hip abduction    Hip adduction    Hip internal rotation    Hip external rotation    Knee flexion    Knee extension    Ankle dorsiflexion 10 12  Ankle plantarflexion 30 25  Ankle inversion    Ankle eversion     (Blank rows = not tested) Great toe: L 30 DF, 0 PF, R 70 DF and 30 PF  LOWER EXTREMITY MMT:  MMT Right eval Left eval  Hip flexion    Hip extension    Hip abduction    Hip adduction    Hip internal rotation    Hip external rotation    Knee flexion    Knee extension    Ankle dorsiflexion 5 4  Ankle plantarflexion 4 2  Ankle inversion 5 4  Ankle eversion 5 4   (Blank rows = not tested)    FUNCTIONAL TESTS:  SLS: L 2 sec, R x 10 sec Mini squat: able to perform Not able to perform heel raise in standing on L                                                                                                                               TREATMENT DATE: 03/10/24  Subjective: Pt reports her heel is not hurting upon arrival; she saw the doctor and did not have an MRI ordered.  He  advised her to try a soft heel cup for cushioning in the heel.  Her toes aren't as tingly today as they have been.   Objective: Therapeutic Exercises: Nustep seat #8, level 4 x 10 minutes, no use of UE today  focused on LE only  Standing calf stretch: 30 sec x 3- standing today with L foot behind, R in front Standing heel raises: 2x12, b/l Standing squats to mat table 2x10   Therapeutic Activities: Small hurdles: 5 laps forward- not today Step ups to standing on bosu 2x10, 5 sec holds for balance on top Lateral step up/over bosu x 2 min ea direction Stairs: up/down 4 laps SLS on airex: 3-4 sec intervals Tandem walk forward/reverse x 5 laps on airex balance beam Squat+lift 5 lb med ball from 6 inch step x10, then from floor x10   Manual Therapy: L Ankle mob with movement for DF 2x10 L TC AP mob, PA mob for DF and PF, Gr III/IV 30 second bouts  Manual ankle PF/DF stretch x 1 min ea, toe extension/flexion 1 min ea Manual stretch for L DF and plantar fascia: 1 min intervals x 3 ea- gentle today for STM STM L gastroc, plantar fascia L  PATIENT EDUCATION:  Education details: PT POC/goals, gait mechanics on stairs, gait mechanics on flat surface Person educated: Patient Education method: Explanation, Demonstration, and Handouts Education comprehension: verbalized understanding  HOME EXERCISE PROGRAM: Access Code: [email or web address] URL: [email or web address] Date: 03/10/2024 Prepared by: Vernell Reges  Exercises - Standing Gastroc Stretch  - 2 x daily - 7 x weekly - 3 sets - 20 hold - Long Sitting Ankle Pumps  - 1 x daily - 7 x weekly - 3 sets - 10 reps - Single Leg Stance  - 1 x daily - 7 x weekly - Tandem Walking  - 1 x daily - 7 x weekly - Backward Tandem Walking  - 1 x daily - 7 x weekly  ASSESSMENT:  CLINICAL IMPRESSION: Pt tolerated progression of therapeutic activities well today; she was challenged appropriately with step ups and step up and over on bosu; no  major loss of balance but notes fatigue in ankle/foot mm by end of session.  She should continue to benefit from skilled PT to address the impairments listed below and to facilitate return to PLOF including improved standing/walking tolerance on even and uneven surfaces.  OBJECTIVE IMPAIRMENTS: Abnormal gait, decreased activity tolerance, decreased balance, difficulty walking, decreased ROM, decreased strength, and pain.   ACTIVITY LIMITATIONS: standing, squatting, stairs, and locomotion level  PARTICIPATION LIMITATIONS: meal prep, cleaning, laundry, interpersonal relationship, shopping, community activity, and yard work  PERSONAL FACTORS: Time since onset of injury/illness/exacerbation are also affecting patient's functional outcome.   REHAB POTENTIAL: Excellent  CLINICAL DECISION MAKING: Stable/uncomplicated  EVALUATION COMPLEXITY: Low   GOALS: Goals reviewed with patient? Yes  SHORT TERM GOALS: Target date: 02/25/24 Initiate HEP for ankle/foot ROM and strengthening Baseline: completed at initial evaluation Goal status: INITIAL    LONG TERM GOALS: Target date: 04/08/24  Improve LEFS ny 20 points indicating pt able to perform her daily activities without being as significantly limited by L ankle/foot  Baseline: 30 Goal status: INITIAL  2.  Improve ankle PF strength >1 MMT grade to facilitate improved ability to stand for her party planning and ambulate on even/uneven surfaces x 1 hour at a time Baseline: 2/5 strength Goal status: INITIAL  3.  Improve L SLS to >10 seconds to facilitate improved balance/decreased fall risk while standing and amb on uneven surfaces and on cruise ship when she travels later this summer wearing normal shoes Baseline: 2 sec Goal status: INITIAL   PLAN:  PT FREQUENCY: 2x/week  PT DURATION: 6 weeks  PLANNED INTERVENTIONS: 97110-Therapeutic exercises, 97530- Therapeutic activity, [id number]- Neuromuscular  re-education, [phone]- Self Care, and 02859-  Manual therapy  PLAN FOR NEXT SESSION: manual therapy, continue progressing LE strengthening/balance/HEP  Vernell Reges, PT, DPT, OCS  Vernell FORBES Reges, PT 03/10/2024, 10:24 AM

## 2024-03-13 ENCOUNTER — Ambulatory Visit

## 2024-03-13 DIAGNOSIS — M25672 Stiffness of left ankle, not elsewhere classified: Secondary | ICD-10-CM | POA: Diagnosis not present

## 2024-03-13 DIAGNOSIS — M62462 Contracture of muscle, left lower leg: Secondary | ICD-10-CM | POA: Diagnosis not present

## 2024-03-13 DIAGNOSIS — M722 Plantar fascial fibromatosis: Secondary | ICD-10-CM

## 2024-03-13 NOTE — Therapy (Signed)
 OUTPATIENT PHYSICAL THERAPY LOWER EXTREMITY TREATMENT   Patient Name: Paula [name] MRN: [id number] DOB:08-08-1956, 68 y.o., female Today's Date: 03/13/2024  END OF SESSION:  PT End of Session - 03/13/24 1127     Visit Number 7    Number of Visits 13    Date for PT Re-Evaluation 04/08/24    Authorization Type 2x/week x 6 weeks (Medicare PN visit #10)    PT Start Time 1115    PT Stop Time 1200    PT Time Calculation (min) 45 min    Activity Tolerance Patient tolerated treatment well    Behavior During Therapy WFL for tasks assessed/performed           Past Medical History:  Diagnosis Date   Asthma    Environmental allergies    GERD (gastroesophageal reflux disease)    Hypertension    Migraine    Sinusitis    Past Surgical History:  Procedure Laterality Date   AUGMENTATION MAMMAPLASTY Bilateral 1987   HERNIA REPAIR  1962   skin cancer   08/11/2016   removal   TUBAL LIGATION  08/11/2016   Patient Active Problem List   Diagnosis Date Noted   Dermatochalasis of both upper eyelids 07/31/2021   Ptosis of both eyebrows 07/31/2021   Allergic rhinitis due to pollen 06/26/2020   Poison ivy dermatitis 05/26/2020   Facet syndrome, lumbar 05/26/2020   Hair loss 05/26/2020   Encounter for routine adult health examination with abnormal findings 09/25/2019   Routine cervical smear 09/25/2019   Screening for osteoporosis 09/25/2019   Essential hypertension 07/06/2019   Generalized anxiety disorder 07/06/2019   Elevated blood pressure reading in office without diagnosis of hypertension 04/06/2019   Generalized abdominal discomfort 03/23/2019   Screening for breast cancer 09/23/2018   Other fatigue 09/23/2018   Headache syndrome 09/23/2018   Vitamin D  deficiency 09/23/2018   Fever blister 09/23/2018   History of malignant melanoma of skin 09/23/2018   Acute upper respiratory infection 08/24/2018   Acute sinusitis 09/21/2017   Unspecified otitis externa, bilateral  09/21/2017   Gastro-esophageal reflux disease without esophagitis 09/21/2017   Insomnia, unspecified 09/21/2017   Unspecified ovarian cyst, right side 09/21/2017   Unspecified ovarian cyst, left side 09/21/2017   Nasal mucositis (ulcerative) 09/21/2017   Allergic rhinitis, unspecified 09/21/2017   Cerebrovascular disease, unspecified 09/21/2017   Herpesviral infection, unspecified 09/21/2017   Varicella without complication 09/21/2017   Dysuria 09/21/2017   Abnormal weight gain 09/21/2017    PCP: Dr. Liana  REFERRING PROVIDER: Dr. Silva  REFERRING DIAG:  M72.2 (ICD-10-CM) - Plantar fasciitis of left foot  M62.462 (ICD-10-CM) - Gastrocnemius equinus, left    THERAPY DIAG:  Stiffness of left ankle, not elsewhere classified  Plantar fasciitis  Rationale for Evaluation and Treatment: Rehabilitation  ONSET DATE: March 2025  SUBJECTIVE:   SUBJECTIVE STATEMENT: Pt reports having L plantar faciitis; underwent surgery end of Feb or early March: plantar fascia release, gastroc lengthening, 5th toe alignment.  She was in a boot NWB x 4 weeks.  Started wearing shoes in April and bearing weight on foot.  She states she saw the doctor last week and is having numbness in her great toe/2nd toe region and will be having another surgery to remove the plate in her foot which is irritating a nerve.  C/o: difficulty going down stairs, difficulty with her balance, and difficulty with prolonged standing and walking.  Hasn't ventured out to walk in her yard, has been mainly staying on concrete or flat  surfaces.  PERTINENT HISTORY: Foot surgery  PAIN:  Are you having pain? Yes, parasthesias in L foot- 4/10  PRECAUTIONS: None  RED FLAGS: None   WEIGHT BEARING RESTRICTIONS: no  FALLS:  Has patient fallen in last 6 months? Yes. Number of falls 1, early on after surgery but none recently  LIVING ENVIRONMENT: Lives with: lives with their family Lives in: House/apartment Stairs:  flight of stairs to get upstairs to second floor of home; 4-5 to enter home with railing Has following equipment at home:   OCCUPATION: retired  PLOF: Independent  PATIENT GOALS: to be able to resume her normal activities again without being limited by L foot- this includes shopping, gardening, party planning, taking cruises, and enjoying spending time in her pool  NEXT MD VISIT: none reported today  OBJECTIVE:  Note: Objective measures were completed at Evaluation unless otherwise noted.  PATIENT SURVEYS:  LEFS 30/80  COGNITION: Overall cognitive status: Within functional limits for tasks assessed     SENSATION: WFL  EDEMA:  No significant difference R vs L figure 8  POSTURE/GAIT: pt amb with decreased WB on L in stance phase  PALPATION: Pt reports tingling/parasthesias 1-2 toe  LOWER EXTREMITY ROM:  Active ROM Right eval Left eval  Hip flexion    Hip extension    Hip abduction    Hip adduction    Hip internal rotation    Hip external rotation    Knee flexion    Knee extension    Ankle dorsiflexion 10 12  Ankle plantarflexion 30 25  Ankle inversion    Ankle eversion     (Blank rows = not tested) Great toe: L 30 DF, 0 PF, R 70 DF and 30 PF  LOWER EXTREMITY MMT:  MMT Right eval Left eval  Hip flexion    Hip extension    Hip abduction    Hip adduction    Hip internal rotation    Hip external rotation    Knee flexion    Knee extension    Ankle dorsiflexion 5 4  Ankle plantarflexion 4 2  Ankle inversion 5 4  Ankle eversion 5 4   (Blank rows = not tested)    FUNCTIONAL TESTS:  SLS: L 2 sec, R x 10 sec Mini squat: able to perform Not able to perform heel raise in standing on L                                                                                                                               TREATMENT DATE: 03/13/24  Subjective: Pt reports she started using her heel cushions in her shoes this weekend.  Did not get in the pool this  weekend.  Overall she is navigating stairs more easily.  Less tingling in toes.  Still limping intermittently.  Objective: Therapeutic Exercises: Nustep seat #8, level 4 x 10 minutes, no use of UE today focused on LE only  Standing calf stretch:  30 sec x 3- standing today with L foot behind, R in front Standing heel raises: 2x12, b/l Standing squats to mat table 2x10, 2x10 with 8# weight front squat Standing lateral step down for LE strengthening: 2x15 L on 6 inch height   Therapeutic Activities: Small hurdles: 5 laps forward- not today Step up- front 6 inch height x10x2 Step ups to standing on bosu 2x10, 5 sec holds for balance on top Lateral step up/over bosu x 2 min ea direction Stairs: up/down 8 laps SLS on airex: 3-4 sec intervals Tandem walk forward/reverse x 5 laps on airex balance beam Squat+lift 5 lb med ball from 6 inch step x10, then from floor x10   Manual Therapy: Not today L Ankle mob with movement for DF 2x10 L TC AP mob, PA mob for DF and PF, Gr III/IV 30 second bouts  Manual ankle PF/DF stretch x 1 min ea, toe extension/flexion 1 min ea Manual stretch for L DF and plantar fascia: 1 min intervals x 3 ea- gentle today for STM STM L gastroc, plantar fascia L  PATIENT EDUCATION:  Education details: PT POC/goals, gait mechanics on stairs, gait mechanics on flat surface Person educated: Patient Education method: Explanation, Demonstration, and Handouts Education comprehension: verbalized understanding  HOME EXERCISE PROGRAM: Access Code: [email or web address] URL: [email or web address] Date: 03/10/2024 Prepared by: Vernell Reges  Exercises - Standing Gastroc Stretch  - 2 x daily - 7 x weekly - 3 sets - 20 hold - Long Sitting Ankle Pumps  - 1 x daily - 7 x weekly - 3 sets - 10 reps - Single Leg Stance  - 1 x daily - 7 x weekly - Tandem Walking  - 1 x daily - 7 x weekly - Backward Tandem Walking  - 1 x daily - 7 x weekly  ASSESSMENT:  CLINICAL  IMPRESSION: Pt tolerated progression of therapeutic exercises for single leg and double leg CKC strengthening well today; she was challenged appropriately with step ups and step up and over on bosu; no major loss of balance but notes fatigue in ankle/foot mm by end of session.  Mechanics on stair descent are improving as SL balance and ankle DF mobility are improving.  She should continue to benefit from skilled PT to address the impairments listed below and to facilitate return to PLOF including improved standing/walking tolerance on even and uneven surfaces.  OBJECTIVE IMPAIRMENTS: Abnormal gait, decreased activity tolerance, decreased balance, difficulty walking, decreased ROM, decreased strength, and pain.   ACTIVITY LIMITATIONS: standing, squatting, stairs, and locomotion level  PARTICIPATION LIMITATIONS: meal prep, cleaning, laundry, interpersonal relationship, shopping, community activity, and yard work  PERSONAL FACTORS: Time since onset of injury/illness/exacerbation are also affecting patient's functional outcome.   REHAB POTENTIAL: Excellent  CLINICAL DECISION MAKING: Stable/uncomplicated  EVALUATION COMPLEXITY: Low   GOALS: Goals reviewed with patient? Yes  SHORT TERM GOALS: Target date: 02/25/24 Initiate HEP for ankle/foot ROM and strengthening Baseline: completed at initial evaluation Goal status: INITIAL    LONG TERM GOALS: Target date: 04/08/24  Improve LEFS ny 20 points indicating pt able to perform her daily activities without being as significantly limited by L ankle/foot  Baseline: 30 Goal status: INITIAL  2.  Improve ankle PF strength >1 MMT grade to facilitate improved ability to stand for her party planning and ambulate on even/uneven surfaces x 1 hour at a time Baseline: 2/5 strength Goal status: INITIAL  3.  Improve L SLS to >10 seconds to facilitate improved balance/decreased fall risk while standing and amb  on uneven surfaces and on cruise ship when she  travels later this summer wearing normal shoes Baseline: 2 sec Goal status: INITIAL   PLAN:  PT FREQUENCY: 2x/week  PT DURATION: 6 weeks  PLANNED INTERVENTIONS: 97110-Therapeutic exercises, 97530- Therapeutic activity, [id number]- Neuromuscular re-education, 97535- Self Care, and [id number]- Manual therapy  PLAN FOR NEXT SESSION: manual therapy, continue progressing LE strengthening/balance/HEP  Vernell Reges, PT, DPT, OCS  Vernell FORBES Reges, PT 03/13/2024, 11:28 AM

## 2024-03-15 ENCOUNTER — Ambulatory Visit: Attending: Podiatry

## 2024-03-15 DIAGNOSIS — M722 Plantar fascial fibromatosis: Secondary | ICD-10-CM | POA: Insufficient documentation

## 2024-03-15 DIAGNOSIS — M25672 Stiffness of left ankle, not elsewhere classified: Secondary | ICD-10-CM | POA: Insufficient documentation

## 2024-03-15 NOTE — Therapy (Signed)
 OUTPATIENT PHYSICAL THERAPY LOWER EXTREMITY TREATMENT   Patient Name: Paula [name] MRN: [id number] DOB:Jul 05, 1956, 68 y.o., female Today's Date: 03/15/2024  END OF SESSION:  PT End of Session - 03/15/24 1124     Visit Number 8    Number of Visits 13    Date for PT Re-Evaluation 04/08/24    Authorization Type 2x/week x 6 weeks (Medicare PN visit #10)    PT Start Time 1115    PT Stop Time 1200    PT Time Calculation (min) 45 min    Activity Tolerance Patient tolerated treatment well    Behavior During Therapy WFL for tasks assessed/performed           Past Medical History:  Diagnosis Date   Asthma    Environmental allergies    GERD (gastroesophageal reflux disease)    Hypertension    Migraine    Sinusitis    Past Surgical History:  Procedure Laterality Date   AUGMENTATION MAMMAPLASTY Bilateral 1987   HERNIA REPAIR  1962   skin cancer   08/11/2016   removal   TUBAL LIGATION  08/11/2016   Patient Active Problem List   Diagnosis Date Noted   Dermatochalasis of both upper eyelids 07/31/2021   Ptosis of both eyebrows 07/31/2021   Allergic rhinitis due to pollen 06/26/2020   Poison ivy dermatitis 05/26/2020   Facet syndrome, lumbar 05/26/2020   Hair loss 05/26/2020   Encounter for routine adult health examination with abnormal findings 09/25/2019   Routine cervical smear 09/25/2019   Screening for osteoporosis 09/25/2019   Essential hypertension 07/06/2019   Generalized anxiety disorder 07/06/2019   Elevated blood pressure reading in office without diagnosis of hypertension 04/06/2019   Generalized abdominal discomfort 03/23/2019   Screening for breast cancer 09/23/2018   Other fatigue 09/23/2018   Headache syndrome 09/23/2018   Vitamin D  deficiency 09/23/2018   Fever blister 09/23/2018   History of malignant melanoma of skin 09/23/2018   Acute upper respiratory infection 08/24/2018   Acute sinusitis 09/21/2017   Unspecified otitis externa, bilateral  09/21/2017   Gastro-esophageal reflux disease without esophagitis 09/21/2017   Insomnia, unspecified 09/21/2017   Unspecified ovarian cyst, right side 09/21/2017   Unspecified ovarian cyst, left side 09/21/2017   Nasal mucositis (ulcerative) 09/21/2017   Allergic rhinitis, unspecified 09/21/2017   Cerebrovascular disease, unspecified 09/21/2017   Herpesviral infection, unspecified 09/21/2017   Varicella without complication 09/21/2017   Dysuria 09/21/2017   Abnormal weight gain 09/21/2017    PCP: Dr. Liana  REFERRING PROVIDER: Dr. Silva  REFERRING DIAG:  M72.2 (ICD-10-CM) - Plantar fasciitis of left foot  M62.462 (ICD-10-CM) - Gastrocnemius equinus, left    THERAPY DIAG:  Stiffness of left ankle, not elsewhere classified  Plantar fasciitis  Rationale for Evaluation and Treatment: Rehabilitation  ONSET DATE: March 2025  SUBJECTIVE:   SUBJECTIVE STATEMENT: Pt reports having L plantar faciitis; underwent surgery end of Feb or early March: plantar fascia release, gastroc lengthening, 5th toe alignment.  She was in a boot NWB x 4 weeks.  Started wearing shoes in April and bearing weight on foot.  She states she saw the doctor last week and is having numbness in her great toe/2nd toe region and will be having another surgery to remove the plate in her foot which is irritating a nerve.  C/o: difficulty going down stairs, difficulty with her balance, and difficulty with prolonged standing and walking.  Hasn't ventured out to walk in her yard, has been mainly staying on concrete or flat  surfaces.  PERTINENT HISTORY: Foot surgery  PAIN:  Are you having pain? Yes, parasthesias in L foot- 4/10  PRECAUTIONS: None  RED FLAGS: None   WEIGHT BEARING RESTRICTIONS: no  FALLS:  Has patient fallen in last 6 months? Yes. Number of falls 1, early on after surgery but none recently  LIVING ENVIRONMENT: Lives with: lives with their family Lives in: House/apartment Stairs:  flight of stairs to get upstairs to second floor of home; 4-5 to enter home with railing Has following equipment at home:   OCCUPATION: retired  PLOF: Independent  PATIENT GOALS: to be able to resume her normal activities again without being limited by L foot- this includes shopping, gardening, party planning, taking cruises, and enjoying spending time in her pool  NEXT MD VISIT: none reported today  OBJECTIVE:  Note: Objective measures were completed at Evaluation unless otherwise noted.  PATIENT SURVEYS:  LEFS 30/80  COGNITION: Overall cognitive status: Within functional limits for tasks assessed     SENSATION: WFL  EDEMA:  No significant difference R vs L figure 8  POSTURE/GAIT: pt amb with decreased WB on L in stance phase  PALPATION: Pt reports tingling/parasthesias 1-2 toe  LOWER EXTREMITY ROM:  Active ROM Right eval Left eval  Hip flexion    Hip extension    Hip abduction    Hip adduction    Hip internal rotation    Hip external rotation    Knee flexion    Knee extension    Ankle dorsiflexion 10 12  Ankle plantarflexion 30 25  Ankle inversion    Ankle eversion     (Blank rows = not tested) Great toe: L 30 DF, 0 PF, R 70 DF and 30 PF  LOWER EXTREMITY MMT:  MMT Right eval Left eval  Hip flexion    Hip extension    Hip abduction    Hip adduction    Hip internal rotation    Hip external rotation    Knee flexion    Knee extension    Ankle dorsiflexion 5 4  Ankle plantarflexion 4 2  Ankle inversion 5 4  Ankle eversion 5 4   (Blank rows = not tested)    FUNCTIONAL TESTS:  SLS: L 2 sec, R x 10 sec Mini squat: able to perform Not able to perform heel raise in standing on L                                                                                                                               TREATMENT DATE: 03/15/24  Subjective: Pt reports her foot is a little sore upon arrival; tends to be sore when it rains.  She feels like she is not  limping as much now.  Calf muscle doesn't feel like she has normal strength.  Bottom of foot feels tight today.    Objective: Therapeutic Exercises: Nustep seat #8, level 4 x 10 minutes, no use of UE today  focused on LE only  Standing calf stretch: 30 sec x 3- standing today with L foot behind, R in front Standing heel raises: 2x12, b/l, up 2/down L single x10 (very difficult) Standing squats to mat table 2x10, 2x10 with 8# weight front squat- not today Standing lateral step down for LE strengthening: 2x15 L on 6 inch height   Therapeutic Activities: Small hurdles: 5 laps forward- not today Step up- front 6 inch height x10x2 Step ups to standing on bosu 2x10, 5 sec holds for balance on top- not today Lateral step up/over bosu x 2 min ea direction- not today Stairs: up/down 8 laps SLS on airex: 3-4 sec intervals Tandem walk forward/reverse x 5 laps on airex balance beam Squat+lift 5 lb med ball from 6 inch step x10, then from floor x10 Star drill- mini lunges- forward, diagonal, lateral- lead with L and lead with R 10 rounds ea   Manual Therapy:  L Ankle mob with movement for DF 2x10 L TC AP mob, PA mob for DF and PF, Gr III/IV 30 second bouts  Manual ankle PF/DF stretch x 1 min ea, toe extension/flexion 1 min ea Manual stretch for L DF and plantar fascia: 1 min intervals x 3 ea and STM STM L gastroc, plantar fascia L  PATIENT EDUCATION:  Education details: PT POC/goals, gait mechanics on stairs, gait mechanics on flat surface Person educated: Patient Education method: Explanation, Demonstration, and Handouts Education comprehension: verbalized understanding  HOME EXERCISE PROGRAM: Access Code: [email or web address] URL: [email or web address] Date: 03/10/2024 Prepared by: Vernell Reges  Exercises - Standing Gastroc Stretch  - 2 x daily - 7 x weekly - 3 sets - 20 hold - Long Sitting Ankle Pumps  - 1 x daily - 7 x weekly - 3 sets - 10 reps - Single Leg Stance  - 1 x daily  - 7 x weekly - Tandem Walking  - 1 x daily - 7 x weekly - Backward Tandem Walking  - 1 x daily - 7 x weekly  ASSESSMENT:  CLINICAL IMPRESSION: Pt tolerated progression of heel raises well today; unable to perform single leg unilateral L heel raise, but able to begin eccentric version with UE support.  Demonstrated good balance and ability to change directions with stepping outside of base of support in various directions on star today.  Mechanics on stair descent are improving as SL balance and ankle DF mobility are improving.  She should continue to benefit from skilled PT to address the impairments listed below and to facilitate return to PLOF including improved standing/walking tolerance on even and uneven surfaces.  OBJECTIVE IMPAIRMENTS: Abnormal gait, decreased activity tolerance, decreased balance, difficulty walking, decreased ROM, decreased strength, and pain.   ACTIVITY LIMITATIONS: standing, squatting, stairs, and locomotion level  PARTICIPATION LIMITATIONS: meal prep, cleaning, laundry, interpersonal relationship, shopping, community activity, and yard work  PERSONAL FACTORS: Time since onset of injury/illness/exacerbation are also affecting patient's functional outcome.   REHAB POTENTIAL: Excellent  CLINICAL DECISION MAKING: Stable/uncomplicated  EVALUATION COMPLEXITY: Low   GOALS: Goals reviewed with patient? Yes  SHORT TERM GOALS: Target date: 02/25/24 Initiate HEP for ankle/foot ROM and strengthening Baseline: completed at initial evaluation Goal status: INITIAL    LONG TERM GOALS: Target date: 04/08/24  Improve LEFS ny 20 points indicating pt able to perform her daily activities without being as significantly limited by L ankle/foot  Baseline: 30 Goal status: INITIAL  2.  Improve ankle PF strength >1 MMT grade to facilitate improved ability to stand for her  party planning and ambulate on even/uneven surfaces x 1 hour at a time Baseline: 2/5 strength Goal  status: INITIAL  3.  Improve L SLS to >10 seconds to facilitate improved balance/decreased fall risk while standing and amb on uneven surfaces and on cruise ship when she travels later this summer wearing normal shoes Baseline: 2 sec Goal status: INITIAL   PLAN:  PT FREQUENCY: 2x/week  PT DURATION: 6 weeks  PLANNED INTERVENTIONS: 97110-Therapeutic exercises, 97530- Therapeutic activity, [id number]- Neuromuscular re-education, 97535- Self Care, and 02859- Manual therapy  PLAN FOR NEXT SESSION: manual therapy, continue progressing LE strengthening/balance/HEP  Vernell Reges, PT, DPT, OCS  Vernell FORBES Reges, PT 03/15/2024, 11:25 AM

## 2024-03-19 NOTE — Therapy (Incomplete)
 OUTPATIENT PHYSICAL THERAPY LOWER EXTREMITY TREATMENT   Patient Name: Paula [name] MRN: [id number] DOB:06-May-1956, 68 y.o., female Today's Date: 03/19/2024  END OF SESSION:     Past Medical History:  Diagnosis Date   Asthma    Environmental allergies    GERD (gastroesophageal reflux disease)    Hypertension    Migraine    Sinusitis    Past Surgical History:  Procedure Laterality Date   AUGMENTATION MAMMAPLASTY Bilateral 1987   HERNIA REPAIR  1962   skin cancer   08/11/2016   removal   TUBAL LIGATION  08/11/2016   Patient Active Problem List   Diagnosis Date Noted   Dermatochalasis of both upper eyelids 07/31/2021   Ptosis of both eyebrows 07/31/2021   Allergic rhinitis due to pollen 06/26/2020   Poison ivy dermatitis 05/26/2020   Facet syndrome, lumbar 05/26/2020   Hair loss 05/26/2020   Encounter for routine adult health examination with abnormal findings 09/25/2019   Routine cervical smear 09/25/2019   Screening for osteoporosis 09/25/2019   Essential hypertension 07/06/2019   Generalized anxiety disorder 07/06/2019   Elevated blood pressure reading in office without diagnosis of hypertension 04/06/2019   Generalized abdominal discomfort 03/23/2019   Screening for breast cancer 09/23/2018   Other fatigue 09/23/2018   Headache syndrome 09/23/2018   Vitamin D  deficiency 09/23/2018   Fever blister 09/23/2018   History of malignant melanoma of skin 09/23/2018   Acute upper respiratory infection 08/24/2018   Acute sinusitis 09/21/2017   Unspecified otitis externa, bilateral 09/21/2017   Gastro-esophageal reflux disease without esophagitis 09/21/2017   Insomnia, unspecified 09/21/2017   Unspecified ovarian cyst, right side 09/21/2017   Unspecified ovarian cyst, left side 09/21/2017   Nasal mucositis (ulcerative) 09/21/2017   Allergic rhinitis, unspecified 09/21/2017   Cerebrovascular disease, unspecified 09/21/2017   Herpesviral infection, unspecified  09/21/2017   Varicella without complication 09/21/2017   Dysuria 09/21/2017   Abnormal weight gain 09/21/2017    PCP: Dr. Liana  REFERRING PROVIDER: Dr. Silva  REFERRING DIAG:  M72.2 (ICD-10-CM) - Plantar fasciitis of left foot  M62.462 (ICD-10-CM) - Gastrocnemius equinus, left    THERAPY DIAG:  Stiffness of left ankle, not elsewhere classified  Plantar fasciitis  Rationale for Evaluation and Treatment: Rehabilitation  ONSET DATE: March 2025  SUBJECTIVE:   SUBJECTIVE STATEMENT: Pt reports having L plantar faciitis; underwent surgery end of Feb or early March: plantar fascia release, gastroc lengthening, 5th toe alignment.  She was in a boot NWB x 4 weeks.  Started wearing shoes in April and bearing weight on foot.  She states she saw the doctor last week and is having numbness in her great toe/2nd toe region and will be having another surgery to remove the plate in her foot which is irritating a nerve.  C/o: difficulty going down stairs, difficulty with her balance, and difficulty with prolonged standing and walking.  Hasn't ventured out to walk in her yard, has been mainly staying on concrete or flat surfaces.  PERTINENT HISTORY: Foot surgery  PAIN:  Are you having pain? Yes, parasthesias in L foot- 4/10  PRECAUTIONS: None  RED FLAGS: None   WEIGHT BEARING RESTRICTIONS: no  FALLS:  Has patient fallen in last 6 months? Yes. Number of falls 1, early on after surgery but none recently  LIVING ENVIRONMENT: Lives with: lives with their family Lives in: House/apartment Stairs: flight of stairs to get upstairs to second floor of home; 4-5 to enter home with railing Has following equipment at home:  OCCUPATION: retired  PLOF: Independent  PATIENT GOALS: to be able to resume her normal activities again without being limited by L foot- this includes shopping, gardening, party planning, taking cruises, and enjoying spending time in her pool  NEXT MD VISIT: none  reported today  OBJECTIVE:  Note: Objective measures were completed at Evaluation unless otherwise noted.  PATIENT SURVEYS:  LEFS 30/80  COGNITION: Overall cognitive status: Within functional limits for tasks assessed     SENSATION: WFL  EDEMA:  No significant difference R vs L figure 8  POSTURE/GAIT: pt amb with decreased WB on L in stance phase  PALPATION: Pt reports tingling/parasthesias 1-2 toe  LOWER EXTREMITY ROM:  Active ROM Right eval Left eval  Hip flexion    Hip extension    Hip abduction    Hip adduction    Hip internal rotation    Hip external rotation    Knee flexion    Knee extension    Ankle dorsiflexion 10 12  Ankle plantarflexion 30 25  Ankle inversion    Ankle eversion     (Blank rows = not tested) Great toe: L 30 DF, 0 PF, R 70 DF and 30 PF  LOWER EXTREMITY MMT:  MMT Right eval Left eval  Hip flexion    Hip extension    Hip abduction    Hip adduction    Hip internal rotation    Hip external rotation    Knee flexion    Knee extension    Ankle dorsiflexion 5 4  Ankle plantarflexion 4 2  Ankle inversion 5 4  Ankle eversion 5 4   (Blank rows = not tested)    FUNCTIONAL TESTS:  SLS: L 2 sec, R x 10 sec Mini squat: able to perform Not able to perform heel raise in standing on L                                                                                                                               TREATMENT DATE: 03/19/24 ***   Subjective: Pt reports her foot is a little sore upon arrival; tends to be sore when it rains.  She feels like she is not limping as much now.  Calf muscle doesn't feel like she has normal strength.  Bottom of foot feels tight today.    Objective: Therapeutic Exercises: Nustep seat #8, level 4 x 10 minutes, no use of UE today focused on LE only  Standing calf stretch: 30 sec x 3- standing today with L foot behind, R in front Standing heel raises: 2x12, b/l, up 2/down L single x10 (very  difficult) Standing squats to mat table 2x10, 2x10 with 8# weight front squat- not today Standing lateral step down for LE strengthening: 2x15 L on 6 inch height   Therapeutic Activities: Small hurdles: 5 laps forward- not today Step up- front 6 inch height x10x2 Step ups to standing on bosu 2x10, 5 sec holds for  balance on top- not today Lateral step up/over bosu x 2 min ea direction- not today Stairs: up/down 8 laps SLS on airex: 3-4 sec intervals Tandem walk forward/reverse x 5 laps on airex balance beam Squat+lift 5 lb med ball from 6 inch step x10, then from floor x10 Star drill- mini lunges- forward, diagonal, lateral- lead with L and lead with R 10 rounds ea   Manual Therapy:  L Ankle mob with movement for DF 2x10 L TC AP mob, PA mob for DF and PF, Gr III/IV 30 second bouts  Manual ankle PF/DF stretch x 1 min ea, toe extension/flexion 1 min ea Manual stretch for L DF and plantar fascia: 1 min intervals x 3 ea and STM STM L gastroc, plantar fascia L  PATIENT EDUCATION:  Education details: PT POC/goals, gait mechanics on stairs, gait mechanics on flat surface Person educated: Patient Education method: Explanation, Demonstration, and Handouts Education comprehension: verbalized understanding  HOME EXERCISE PROGRAM: Access Code: [email or web address] URL: [email or web address] Date: 03/10/2024 Prepared by: Vernell Reges  Exercises - Standing Gastroc Stretch  - 2 x daily - 7 x weekly - 3 sets - 20 hold - Long Sitting Ankle Pumps  - 1 x daily - 7 x weekly - 3 sets - 10 reps - Single Leg Stance  - 1 x daily - 7 x weekly - Tandem Walking  - 1 x daily - 7 x weekly - Backward Tandem Walking  - 1 x daily - 7 x weekly  ASSESSMENT:  CLINICAL IMPRESSION: ***  Pt tolerated progression of heel raises well today; unable to perform single leg unilateral L heel raise, but able to begin eccentric version with UE support.  Demonstrated good balance and ability to change directions  with stepping outside of base of support in various directions on star today.  Mechanics on stair descent are improving as SL balance and ankle DF mobility are improving.  She should continue to benefit from skilled PT to address the impairments listed below and to facilitate return to PLOF including improved standing/walking tolerance on even and uneven surfaces.  OBJECTIVE IMPAIRMENTS: Abnormal gait, decreased activity tolerance, decreased balance, difficulty walking, decreased ROM, decreased strength, and pain.   ACTIVITY LIMITATIONS: standing, squatting, stairs, and locomotion level  PARTICIPATION LIMITATIONS: meal prep, cleaning, laundry, interpersonal relationship, shopping, community activity, and yard work  PERSONAL FACTORS: Time since onset of injury/illness/exacerbation are also affecting patient's functional outcome.   REHAB POTENTIAL: Excellent  CLINICAL DECISION MAKING: Stable/uncomplicated  EVALUATION COMPLEXITY: Low   GOALS: Goals reviewed with patient? Yes  SHORT TERM GOALS: Target date: 02/25/24 Initiate HEP for ankle/foot ROM and strengthening Baseline: completed at initial evaluation Goal status: INITIAL    LONG TERM GOALS: Target date: 04/08/24  Improve LEFS ny 20 points indicating pt able to perform her daily activities without being as significantly limited by L ankle/foot  Baseline: 30 Goal status: INITIAL  2.  Improve ankle PF strength >1 MMT grade to facilitate improved ability to stand for her party planning and ambulate on even/uneven surfaces x 1 hour at a time Baseline: 2/5 strength Goal status: INITIAL  3.  Improve L SLS to >10 seconds to facilitate improved balance/decreased fall risk while standing and amb on uneven surfaces and on cruise ship when she travels later this summer wearing normal shoes Baseline: 2 sec Goal status: INITIAL   PLAN:  PT FREQUENCY: 2x/week  PT DURATION: 6 weeks  PLANNED INTERVENTIONS: 97110-Therapeutic exercises,  97530- Therapeutic activity, [id number]- Neuromuscular re-education, 97535- Self Care,  and [id number]- Manual therapy  PLAN FOR NEXT SESSION: manual therapy, continue progressing LE strengthening/balance/HEP  Paula [name], PT, DPT Physical Therapist - [facility]  [facility] Paula [name], PT 03/19/2024, 3:22 PM

## 2024-03-20 ENCOUNTER — Ambulatory Visit: Admitting: Physical Therapy

## 2024-03-20 DIAGNOSIS — M25672 Stiffness of left ankle, not elsewhere classified: Secondary | ICD-10-CM

## 2024-03-20 DIAGNOSIS — M722 Plantar fascial fibromatosis: Secondary | ICD-10-CM

## 2024-03-22 ENCOUNTER — Ambulatory Visit (INDEPENDENT_AMBULATORY_CARE_PROVIDER_SITE_OTHER): Admitting: Nurse Practitioner

## 2024-03-22 ENCOUNTER — Encounter: Payer: Self-pay | Admitting: Nurse Practitioner

## 2024-03-22 ENCOUNTER — Telehealth: Payer: Self-pay

## 2024-03-22 VITALS — BP 134/80 | HR 85 | Temp 98.0°F | Resp 16 | Ht 63.0 in | Wt 180.8 lb

## 2024-03-22 DIAGNOSIS — T63461A Toxic effect of venom of wasps, accidental (unintentional), initial encounter: Secondary | ICD-10-CM

## 2024-03-22 DIAGNOSIS — Z91030 Bee allergy status: Secondary | ICD-10-CM | POA: Diagnosis not present

## 2024-03-22 DIAGNOSIS — F411 Generalized anxiety disorder: Secondary | ICD-10-CM | POA: Diagnosis not present

## 2024-03-22 DIAGNOSIS — T63441A Toxic effect of venom of bees, accidental (unintentional), initial encounter: Secondary | ICD-10-CM | POA: Diagnosis not present

## 2024-03-22 MED ORDER — LORAZEPAM 1 MG PO TABS: 1.0000 mg | ORAL_TABLET | Freq: Every evening | ORAL | 0 refills | Status: DC | PRN

## 2024-03-22 MED ORDER — PREDNISONE 10 MG (21) PO TBPK: ORAL_TABLET | ORAL | 0 refills | Status: DC

## 2024-03-22 MED ORDER — TRIAMCINOLONE ACETONIDE 0.1 % EX CREA: 1.0000 | TOPICAL_CREAM | Freq: Two times a day (BID) | CUTANEOUS | 1 refills | Status: AC | PRN

## 2024-03-22 MED ORDER — METHYLPREDNISOLONE ACETATE 80 MG/ML IJ SUSP
80.0000 mg | Freq: Once | INTRAMUSCULAR | Status: AC
  Administered 2024-03-22: 40 mg via INTRAMUSCULAR

## 2024-03-22 NOTE — Progress Notes (Signed)
 [facility] [address] [place], [place] [address]  Internal MEDICINE  Office Visit Note  Patient Name: Paula [name]  [id number]  [id number]  Date of Service: 03/22/2024  Chief Complaint  Patient presents with  . Acute Visit    Multiple bee stings      HPI Paula [name] presents for an acute sick visit for yellow jacket stings -- yellow jacket stings to right hand and left foot occurred yesterday. Right hand and left foot are significantly swollen, itchy and painful      Current Medication:  Outpatient Encounter Medications as of 03/22/2024  Medication Sig  . LORazepam  (ATIVAN ) 1 MG tablet Take 1 tablet (1 mg total) by mouth at bedtime as needed for anxiety or sleep.  . predniSONE  (STERAPRED UNI-PAK 21 TAB) 10 MG (21) TBPK tablet Use as directed for 6 days, start tomorrow 03/23/24  . triamcinolone  cream (KENALOG ) 0.1 % Apply 1 Application topically 2 (two) times daily as needed (itchy insect stings).  . diclofenac  (VOLTAREN ) 75 MG EC tablet Take 1 tablet (75 mg total) by mouth 2 (two) times daily.  . fexofenadine-pseudoephedrine (ALLEGRA-D 24) 180-240 MG 24 hr tablet Take 1 tablet by mouth daily.  . fluticasone  (FLONASE ) 50 MCG/ACT nasal spray Place 2 sprays into both nostrils daily.  . mupirocin  ointment (BACTROBAN ) 2 % Apply 1 Application topically 2 (two) times daily as needed.  . [DISCONTINUED] benzonatate  (TESSALON ) 200 MG capsule Take 1 capsule (200 mg total) by mouth 2 (two) times daily as needed for cough. (Patient not taking: Reported on 03/08/2024)  . [DISCONTINUED] erythromycin ophthalmic ointment 3 (three) times daily. (Patient not taking: Reported on 03/08/2024)  . [DISCONTINUED] gabapentin  (NEURONTIN ) 300 MG capsule Take 1 capsule (300 mg total) by mouth 3 (three) times daily for 14 days. (Patient not taking: Reported on 03/08/2024)  . [DISCONTINUED] LORazepam  (ATIVAN ) 0.5 MG tablet Take one tab po qd as needed for anxiety  . [DISCONTINUED] montelukast  (SINGULAIR )  10 MG tablet Take 1 tablet (10 mg total) by mouth at bedtime. (Patient not taking: Reported on 03/08/2024)  . [DISCONTINUED] pantoprazole  (PROTONIX ) 40 MG tablet Take 1 tablet (40 mg total) by mouth daily. (Patient not taking: Reported on 03/08/2024)  . [DISCONTINUED] rosuvastatin  (CRESTOR ) 5 MG tablet TAKE 1 TABLET BY MOUTH TWICE A WEEK (Patient not taking: Reported on 03/08/2024)  . [DISCONTINUED] spironolactone  (ALDACTONE ) 25 MG tablet Take 1 tablet (25 mg total) by mouth 2 (two) times daily. (Patient not taking: Reported on 03/08/2024)  . [DISCONTINUED] VITAMIN D , CHOLECALCIFEROL, PO Take 500 Units by mouth. (Patient not taking: Reported on 03/08/2024)  . [EXPIRED] methylPREDNISolone  acetate (DEPO-MEDROL ) injection 80 mg    No facility-administered encounter medications on file as of 03/22/2024.      Medical History: Past Medical History:  Diagnosis Date  . Asthma   . Environmental allergies   . GERD (gastroesophageal reflux disease)   . Hypertension   . Migraine   . Sinusitis      Vital Signs: BP 134/80   Pulse 85   Temp 98 F (36.7 C)   Resp 16   Ht 5' 3 (1.6 m)   Wt 180 lb 12.8 oz (82 kg)   SpO2 95%   BMI 32.03 kg/m    Review of Systems  Constitutional:  Negative for fatigue.  Respiratory: Negative.  Negative for cough, chest tightness, shortness of breath and wheezing.   Cardiovascular: Negative.  Negative for chest pain and palpitations.  Skin:  Positive for rash (insect stings with swelling  and itching on right hand and left foot).  Psychiatric/Behavioral:  The patient is nervous/anxious.     Physical Exam Vitals reviewed.  Constitutional:      General: She is not in acute distress.    Appearance: Normal appearance. She is obese. She is not ill-appearing.  HENT:     Head: Normocephalic and atraumatic.  Eyes:     Pupils: Pupils are equal, round, and reactive to light.  Cardiovascular:     Rate and Rhythm: Normal rate and regular rhythm.  Pulmonary:      Effort: Pulmonary effort is normal. No respiratory distress.  Skin:    Findings: Rash (insect stings with swelling and itching on right hand and left foot) present.  Neurological:     Mental Status: She is alert and oriented to person, place, and time.  Psychiatric:        Mood and Affect: Mood normal.        Behavior: Behavior normal.       Assessment/Plan: 1. Yellow jacket sting, accidental or unintentional, initial encounter (Primary) *** - methylPREDNISolone  acetate (DEPO-MEDROL ) injection 80 mg - predniSONE  (STERAPRED UNI-PAK 21 TAB) 10 MG (21) TBPK tablet; Use as directed for 6 days, start tomorrow 03/23/24  Dispense: 21 tablet; Refill: 0 - triamcinolone  cream (KENALOG ) 0.1 %; Apply 1 Application topically 2 (two) times daily as needed (itchy insect stings).  Dispense: 80 g; Refill: 1  2. Yellow jacket sting allergy *** - methylPREDNISolone  acetate (DEPO-MEDROL ) injection 80 mg  3. Generalized anxiety disorder *** - LORazepam  (ATIVAN ) 1 MG tablet; Take 1 tablet (1 mg total) by mouth at bedtime as needed for anxiety or sleep.  Dispense: 30 tablet; Refill: 0   General Counseling: Dmiya verbalizes understanding of the findings of todays visit and agrees with plan of treatment. I have discussed any further diagnostic evaluation that may be needed or ordered today. We also reviewed her medications today. she has been encouraged to call the office with any questions or concerns that should arise related to todays visit.    Counseling:    No orders of the defined types were placed in this encounter.   Meds ordered this encounter  Medications  . LORazepam  (ATIVAN ) 1 MG tablet    Sig: Take 1 tablet (1 mg total) by mouth at bedtime as needed for anxiety or sleep.    Dispense:  30 tablet    Refill:  0    Note increased dose, fill new script today  . methylPREDNISolone  acetate (DEPO-MEDROL ) injection 80 mg  . predniSONE  (STERAPRED UNI-PAK 21 TAB) 10 MG (21) TBPK tablet     Sig: Use as directed for 6 days, start tomorrow 03/23/24    Dispense:  21 tablet    Refill:  0  . triamcinolone  cream (KENALOG ) 0.1 %    Sig: Apply 1 Application topically 2 (two) times daily as needed (itchy insect stings).    Dispense:  80 g    Refill:  1    Return if symptoms worsen or fail to improve.  [place] Controlled Substance Database was reviewed by me for overdose risk score (ORS)  Time spent:30 Minutes Time spent with patient included reviewing progress notes, labs, imaging studies, and discussing plan for follow up.   This patient was seen by Mardy Maxin, FNP-C in collaboration with Dr. Sigrid Bathe as a part of collaborative care agreement.  Keshonna Valvo R. Maxin, MSN, FNP-C Internal Medicine

## 2024-03-23 ENCOUNTER — Encounter: Payer: Self-pay | Admitting: Nurse Practitioner

## 2024-03-23 MED ORDER — EPINEPHRINE 0.3 MG/0.3ML IJ SOAJ: 0.3000 mg | INTRAMUSCULAR | 5 refills | Status: AC | PRN

## 2024-03-23 NOTE — Telephone Encounter (Signed)
 Lmom that we sent epipen 

## 2024-03-24 ENCOUNTER — Ambulatory Visit

## 2024-03-24 DIAGNOSIS — M722 Plantar fascial fibromatosis: Secondary | ICD-10-CM | POA: Diagnosis not present

## 2024-03-24 DIAGNOSIS — M25672 Stiffness of left ankle, not elsewhere classified: Secondary | ICD-10-CM

## 2024-03-24 NOTE — Therapy (Signed)
 OUTPATIENT PHYSICAL THERAPY LOWER EXTREMITY TREATMENT   Patient Name: Paula [name] MRN: [id number] DOB:[birth year], 68 y.o., female Today's Date: 03/24/2024  END OF SESSION:  PT End of Session - 03/24/24 1134     Visit Number 9    Number of Visits 13    Date for PT Re-Evaluation 04/08/24    Authorization Type 2x/week x 6 weeks (Medicare PN visit #10)    PT Start Time 1125    PT Stop Time 1215    PT Time Calculation (min) 50 min    Activity Tolerance Patient tolerated treatment well    Behavior During Therapy WFL for tasks assessed/performed           Past Medical History:  Diagnosis Date   Asthma    Environmental allergies    GERD (gastroesophageal reflux disease)    Hypertension    Migraine    Sinusitis    Past Surgical History:  Procedure Laterality Date   AUGMENTATION MAMMAPLASTY Bilateral 1987   HERNIA REPAIR  1962   skin cancer   08/11/2016   removal   TUBAL LIGATION  08/11/2016   Patient Active Problem List   Diagnosis Date Noted   Dermatochalasis of both upper eyelids 07/31/2021   Ptosis of both eyebrows 07/31/2021   Allergic rhinitis due to pollen 06/26/2020   Poison ivy dermatitis 05/26/2020   Facet syndrome, lumbar 05/26/2020   Hair loss 05/26/2020   Encounter for routine adult health examination with abnormal findings 09/25/2019   Routine cervical smear 09/25/2019   Screening for osteoporosis 09/25/2019   Essential hypertension 07/06/2019   Generalized anxiety disorder 07/06/2019   Elevated blood pressure reading in office without diagnosis of hypertension 04/06/2019   Generalized abdominal discomfort 03/23/2019   Screening for breast cancer 09/23/2018   Other fatigue 09/23/2018   Headache syndrome 09/23/2018   Vitamin D  deficiency 09/23/2018   Fever blister 09/23/2018   History of malignant melanoma of skin 09/23/2018   Acute upper respiratory infection 08/24/2018   Acute sinusitis 09/21/2017   Unspecified otitis externa, bilateral  09/21/2017   Gastro-esophageal reflux disease without esophagitis 09/21/2017   Insomnia, unspecified 09/21/2017   Unspecified ovarian cyst, right side 09/21/2017   Unspecified ovarian cyst, left side 09/21/2017   Nasal mucositis (ulcerative) 09/21/2017   Allergic rhinitis, unspecified 09/21/2017   Cerebrovascular disease, unspecified 09/21/2017   Herpesviral infection, unspecified 09/21/2017   Varicella without complication 09/21/2017   Dysuria 09/21/2017   Abnormal weight gain 09/21/2017    PCP: Dr. Liana  REFERRING PROVIDER: Dr. Silva  REFERRING DIAG:  M72.2 (ICD-10-CM) - Plantar fasciitis of left foot  M62.462 (ICD-10-CM) - Gastrocnemius equinus, left    THERAPY DIAG:  Stiffness of left ankle, not elsewhere classified  Plantar fasciitis  Rationale for Evaluation and Treatment: Rehabilitation  ONSET DATE: March 2025  SUBJECTIVE:   SUBJECTIVE STATEMENT: Pt reports having L plantar faciitis; underwent surgery end of Feb or early March: plantar fascia release, gastroc lengthening, 5th toe alignment.  She was in a boot NWB x 4 weeks.  Started wearing shoes in April and bearing weight on foot.  She states she saw the doctor last week and is having numbness in her great toe/2nd toe region and will be having another surgery to remove the plate in her foot which is irritating a nerve.  C/o: difficulty going down stairs, difficulty with her balance, and difficulty with prolonged standing and walking.  Hasn't ventured out to walk in her yard, has been mainly staying on concrete or flat  surfaces.  PERTINENT HISTORY: Foot surgery  PAIN:  Are you having pain? Yes, parasthesias in L foot- 4/10  PRECAUTIONS: None  RED FLAGS: None   WEIGHT BEARING RESTRICTIONS: no  FALLS:  Has patient fallen in last 6 months? Yes. Number of falls 1, early on after surgery but none recently  LIVING ENVIRONMENT: Lives with: lives with their family Lives in: House/apartment Stairs:  flight of stairs to get upstairs to second floor of home; 4-5 to enter home with railing Has following equipment at home:   OCCUPATION: retired  PLOF: Independent  PATIENT GOALS: to be able to resume her normal activities again without being limited by L foot- this includes shopping, gardening, party planning, taking cruises, and enjoying spending time in her pool  NEXT MD VISIT: none reported today  OBJECTIVE:  Note: Objective measures were completed at Evaluation unless otherwise noted.  PATIENT SURVEYS:  LEFS 30/80  COGNITION: Overall cognitive status: Within functional limits for tasks assessed     SENSATION: WFL  EDEMA:  No significant difference R vs L figure 8  POSTURE/GAIT: pt amb with decreased WB on L in stance phase  PALPATION: Pt reports tingling/parasthesias 1-2 toe  LOWER EXTREMITY ROM:  Active ROM Right eval Left eval  Hip flexion    Hip extension    Hip abduction    Hip adduction    Hip internal rotation    Hip external rotation    Knee flexion    Knee extension    Ankle dorsiflexion 10 12  Ankle plantarflexion 30 25  Ankle inversion    Ankle eversion     (Blank rows = not tested) Great toe: L 30 DF, 0 PF, R 70 DF and 30 PF  LOWER EXTREMITY MMT:  MMT Right eval Left eval  Hip flexion    Hip extension    Hip abduction    Hip adduction    Hip internal rotation    Hip external rotation    Knee flexion    Knee extension    Ankle dorsiflexion 5 4  Ankle plantarflexion 4 2  Ankle inversion 5 4  Ankle eversion 5 4   (Blank rows = not tested)    FUNCTIONAL TESTS:  SLS: L 2 sec, R x 10 sec Mini squat: able to perform Not able to perform heel raise in standing on L                                                                                                                               TREATMENT DATE: 03/24/24  Subjective: Pt reports her foot is a little sore upon arrival; having some swelling along medial ankle where she was  stung by a yellow jacket this week.  Surgery scheduled for end of Aug to remove plate.  Has a cruise scheduled mid Aug.  Would like to feel stronger before walking around on the cruise and/or uneven surfaces.  Objective: Therapeutic Exercises: Nustep seat #  8, level 4 x 10 minutes, no use of UE today focused on LE only  Standing calf stretch: 30 sec x 3- standing today with L foot behind, R in front Standing heel raises: 2x12, b/l, up 2/down L single x10 (very difficult) Standing squats to mat table 2x10, 2x10 with 8# weight front squat- not today Standing lateral step down for LE strengthening: 2x15 L on 6 inch height   Therapeutic Activities: Small hurdles: 5 laps forward- not today Step up- front 6 inch height x10x2 Step ups to standing on bosu 2x10, 5 sec holds for balance on top- not today Lateral step up/over bosu x 2 min ea direction- not today Stairs: up/down 8 laps SLS on airex: 3-4 sec intervals Tandem walk forward/reverse x 5 laps on airex balance beam Lateral stepping on airex beam 4 laps Squat+lift 5 lb med ball from 6 inch step x10, then from floor x10 Star drill- mini lunges- forward, diagonal, lateral- lead with L and lead with R 10 rounds ea   Manual Therapy:  L Ankle mob with movement for DF 2x10 L TC AP mob, PA mob for DF and PF, Gr III/IV 30 second bouts  Manual ankle PF/DF stretch x 1 min ea, toe extension/flexion 1 min ea Manual stretch for L DF and plantar fascia: 1 min intervals x 3 ea and STM STM L gastroc, plantar fascia L mainly today  PATIENT EDUCATION:  Education details: PT POC/goals, gait mechanics on stairs, gait mechanics on flat surface Person educated: Patient Education method: Explanation, Demonstration, and Handouts Education comprehension: verbalized understanding  HOME EXERCISE PROGRAM: Access Code: [email or web address] URL: [email or web address] Date: 03/10/2024 Prepared by: Vernell Reges  Exercises - Standing Gastroc Stretch  - 2  x daily - 7 x weekly - 3 sets - 20 hold - Long Sitting Ankle Pumps  - 1 x daily - 7 x weekly - 3 sets - 10 reps - Single Leg Stance  - 1 x daily - 7 x weekly - Tandem Walking  - 1 x daily - 7 x weekly - Backward Tandem Walking  - 1 x daily - 7 x weekly  ASSESSMENT:  CLINICAL IMPRESSION: Pt's gait pattern on stair descent is improving; at end of session able to navigate descent with reciprocal pattern and symmetrical stance time on each LE.  Overall still lacks strength to perform single leg unilateral L heel raise, but able to perform eccentric version with UE support.  Demonstrated good balance stepping in various directions on uneven airex beam today; lightly touches wall for support intermittently.  She should continue to benefit from skilled PT to address the impairments listed below and to facilitate return to PLOF including improved standing/walking tolerance on even and uneven surfaces.  OBJECTIVE IMPAIRMENTS: Abnormal gait, decreased activity tolerance, decreased balance, difficulty walking, decreased ROM, decreased strength, and pain.   ACTIVITY LIMITATIONS: standing, squatting, stairs, and locomotion level  PARTICIPATION LIMITATIONS: meal prep, cleaning, laundry, interpersonal relationship, shopping, community activity, and yard work  PERSONAL FACTORS: Time since onset of injury/illness/exacerbation are also affecting patient's functional outcome.   REHAB POTENTIAL: Excellent  CLINICAL DECISION MAKING: Stable/uncomplicated  EVALUATION COMPLEXITY: Low   GOALS: Goals reviewed with patient? Yes  SHORT TERM GOALS: Target date: 02/25/24 Initiate HEP for ankle/foot ROM and strengthening Baseline: completed at initial evaluation Goal status: INITIAL    LONG TERM GOALS: Target date: 04/08/24  Improve LEFS ny 20 points indicating pt able to perform her daily activities without being as significantly limited by L ankle/foot  Baseline: 30 Goal status: INITIAL  2.  Improve ankle  PF strength >1 MMT grade to facilitate improved ability to stand for her party planning and ambulate on even/uneven surfaces x 1 hour at a time Baseline: 2/5 strength Goal status: INITIAL  3.  Improve L SLS to >10 seconds to facilitate improved balance/decreased fall risk while standing and amb on uneven surfaces and on cruise ship when she travels later this summer wearing normal shoes Baseline: 2 sec Goal status: INITIAL   PLAN:  PT FREQUENCY: 2x/week  PT DURATION: 6 weeks  PLANNED INTERVENTIONS: 97110-Therapeutic exercises, 97530- Therapeutic activity, [id number]- Neuromuscular re-education, 97535- Self Care, and [id number]- Manual therapy  PLAN FOR NEXT SESSION: manual therapy, continue progressing LE strengthening/balance/HEP, PN at next visit  Vernell Reges, PT, DPT, OCS  Vernell FORBES Reges, PT 03/24/2024, 11:34 AM

## 2024-03-27 ENCOUNTER — Ambulatory Visit

## 2024-03-27 DIAGNOSIS — M722 Plantar fascial fibromatosis: Secondary | ICD-10-CM

## 2024-03-27 DIAGNOSIS — M25672 Stiffness of left ankle, not elsewhere classified: Secondary | ICD-10-CM | POA: Diagnosis not present

## 2024-03-27 NOTE — Therapy (Signed)
 OUTPATIENT PHYSICAL THERAPY LOWER EXTREMITY TREATMENT/PROGRESS NOTE   Patient Name: Paula [name] MRN: [id number] DOB:March 25, 1956, 68 y.o., female Today's Date: 03/27/2024  END OF SESSION:  PT End of Session - 03/27/24 1130     Visit Number 10    Number of Visits 14    Date for PT Re-Evaluation 04/08/24    Authorization Type 2x/week x 6 weeks (Medicare PN DONE at visit #89; re-cert needed 2/73/74)    PT Start Time 1120    PT Stop Time 1205    PT Time Calculation (min) 45 min    Activity Tolerance Patient tolerated treatment well    Behavior During Therapy WFL for tasks assessed/performed           Past Medical History:  Diagnosis Date   Asthma    Environmental allergies    GERD (gastroesophageal reflux disease)    Hypertension    Migraine    Sinusitis    Past Surgical History:  Procedure Laterality Date   AUGMENTATION MAMMAPLASTY Bilateral 1987   HERNIA REPAIR  1962   skin cancer   08/11/2016   removal   TUBAL LIGATION  08/11/2016   Patient Active Problem List   Diagnosis Date Noted   Dermatochalasis of both upper eyelids 07/31/2021   Ptosis of both eyebrows 07/31/2021   Allergic rhinitis due to pollen 06/26/2020   Poison ivy dermatitis 05/26/2020   Facet syndrome, lumbar 05/26/2020   Hair loss 05/26/2020   Encounter for routine adult health examination with abnormal findings 09/25/2019   Routine cervical smear 09/25/2019   Screening for osteoporosis 09/25/2019   Essential hypertension 07/06/2019   Generalized anxiety disorder 07/06/2019   Elevated blood pressure reading in office without diagnosis of hypertension 04/06/2019   Generalized abdominal discomfort 03/23/2019   Screening for breast cancer 09/23/2018   Other fatigue 09/23/2018   Headache syndrome 09/23/2018   Vitamin D  deficiency 09/23/2018   Fever blister 09/23/2018   History of malignant melanoma of skin 09/23/2018   Acute upper respiratory infection 08/24/2018   Acute sinusitis 09/21/2017    Unspecified otitis externa, bilateral 09/21/2017   Gastro-esophageal reflux disease without esophagitis 09/21/2017   Insomnia, unspecified 09/21/2017   Unspecified ovarian cyst, right side 09/21/2017   Unspecified ovarian cyst, left side 09/21/2017   Nasal mucositis (ulcerative) 09/21/2017   Allergic rhinitis, unspecified 09/21/2017   Cerebrovascular disease, unspecified 09/21/2017   Herpesviral infection, unspecified 09/21/2017   Varicella without complication 09/21/2017   Dysuria 09/21/2017   Abnormal weight gain 09/21/2017    PCP: Dr. Liana  REFERRING PROVIDER: Dr. Silva  REFERRING DIAG:  M72.2 (ICD-10-CM) - Plantar fasciitis of left foot  M62.462 (ICD-10-CM) - Gastrocnemius equinus, left    THERAPY DIAG:  Stiffness of left ankle, not elsewhere classified  Plantar fasciitis  Rationale for Evaluation and Treatment: Rehabilitation  ONSET DATE: March 2025  SUBJECTIVE:   SUBJECTIVE STATEMENT: Pt reports having L plantar faciitis; underwent surgery end of Feb or early March: plantar fascia release, gastroc lengthening, 5th toe alignment.  She was in a boot NWB x 4 weeks.  Started wearing shoes in April and bearing weight on foot.  She states she saw the doctor last week and is having numbness in her great toe/2nd toe region and will be having another surgery to remove the plate in her foot which is irritating a nerve.  C/o: difficulty going down stairs, difficulty with her balance, and difficulty with prolonged standing and walking.  Hasn't ventured out to walk in her yard, has been  mainly staying on concrete or flat surfaces.  PERTINENT HISTORY: Foot surgery  PAIN:  Are you having pain? Yes, parasthesias in L foot- 4/10  PRECAUTIONS: None  RED FLAGS: None   WEIGHT BEARING RESTRICTIONS: no  FALLS:  Has patient fallen in last 6 months? Yes. Number of falls 1, early on after surgery but none recently  LIVING ENVIRONMENT: Lives with: lives with their  family Lives in: House/apartment Stairs: flight of stairs to get upstairs to second floor of home; 4-5 to enter home with railing Has following equipment at home:   OCCUPATION: retired  PLOF: Independent  PATIENT GOALS: to be able to resume her normal activities again without being limited by L foot- this includes shopping, gardening, party planning, taking cruises, and enjoying spending time in her pool  NEXT MD VISIT: none reported today  OBJECTIVE:  Note: Objective measures were completed at Evaluation unless otherwise noted.  PATIENT SURVEYS:  LEFS 30/80  COGNITION: Overall cognitive status: Within functional limits for tasks assessed     SENSATION: WFL  EDEMA:  No significant difference R vs L figure 8  POSTURE/GAIT: pt amb with decreased WB on L in stance phase  PALPATION: Pt reports tingling/parasthesias 1-2 toe  LOWER EXTREMITY ROM:  Active ROM Right eval Left eval  Hip flexion    Hip extension    Hip abduction    Hip adduction    Hip internal rotation    Hip external rotation    Knee flexion    Knee extension    Ankle dorsiflexion 10 12  Ankle plantarflexion 30 25  Ankle inversion    Ankle eversion     (Blank rows = not tested) Great toe: L 30 DF, 0 PF, R 70 DF and 30 PF  LOWER EXTREMITY MMT:  MMT Right eval Left eval  Hip flexion    Hip extension    Hip abduction    Hip adduction    Hip internal rotation    Hip external rotation    Knee flexion    Knee extension    Ankle dorsiflexion 5 4  Ankle plantarflexion 4 2  Ankle inversion 5 4  Ankle eversion 5 4   (Blank rows = not tested)    FUNCTIONAL TESTS:  SLS: L 2 sec, R x 10 sec Mini squat: able to perform Not able to perform heel raise in standing on L                                                                                                                               TREATMENT DATE: 03/27/24  Subjective: Pt reports overall feeling like progress is being made with PT.   Surgery scheduled for end of Aug to remove plate.  Has a cruise scheduled mid Aug.  Would like to feel stronger before walking around on the cruise and/or uneven surfaces.  Pt is walking a little better inside and on stairs.  Balance is improving  but not feeling completely normal with balance/strength yet for prolonged standing and walking.  Objective: Updated goals/goal reassessment today Gait: pt able to amb up/down stairs with reciprocal pattern now, improved ability to descend off L LE , nearly symmetrical Ankle AROM: DF 15 deg L, gr toe ext 10 deg Ankle MMT: PF 2+/5, DF: 5/5, inv 4+/5, eversion 4/5 SLS: L 5 seconds with shoes on and no UE support now (improved) LEFS: was 30 at initial eval   Therapeutic Exercises: Nustep seat #8, level 4 x 10 minutes, no use of UE today focused on LE only - not today Standing calf stretch: 30 sec x 3- standing today with L foot behind, R in front Standing heel raises: 2x12, b/l, up 2/down L single x10 (very difficult) Standing squats to mat table 2x10, 2x10 with 8# weight front squat- not today Standing lateral step down for LE strengthening: 2x15 L on 6 inch height   Therapeutic Activities: Small hurdles: 5 laps forward- not today Step up- front 6 inch height x10x2- not today Step ups to standing on bosu 2x10, 5 sec holds for balance on top- not today  Stairs: up/down 8 laps- not today Step up/reverse step down onto bosu x LRRL, step up and over to R and to L x 15 ea, step up and over forward x 15 LRRL SLS on airex: 3-4 sec intervals Tandem walk forward/reverse x 5 laps on airex balance beam Lateral stepping on airex beam 4 laps Squat+lift 5 lb med ball from 6 inch step x10, then from floor x10 Star drill- mini lunges- forward, diagonal, lateral- lead with L and lead with R 10 rounds ea   Manual Therapy:  L Ankle mob with movement for DF 2x10 L TC AP mob, PA mob for DF and PF, Gr III/IV 30 second bouts  Manual ankle PF/DF stretch x 1 min ea,  toe extension/flexion 1 min ea Manual stretch for L DF and plantar fascia: 1 min intervals x 3 ea and STM STM L gastroc, plantar fascia L mainly today- not today  PATIENT EDUCATION:  Education details: PT POC/goals, gait mechanics on stairs, gait mechanics on flat surface Person educated: Patient Education method: Explanation, Demonstration, and Handouts Education comprehension: verbalized understanding  HOME EXERCISE PROGRAM: Access Code: [email or web address] URL: [email or web address] Date: 03/10/2024 Prepared by: Vernell Reges  Exercises - Standing Gastroc Stretch  - 2 x daily - 7 x weekly - 3 sets - 20 hold - Long Sitting Ankle Pumps  - 1 x daily - 7 x weekly - 3 sets - 10 reps - Single Leg Stance  - 1 x daily - 7 x weekly - Tandem Walking  - 1 x daily - 7 x weekly - Backward Tandem Walking  - 1 x daily - 7 x weekly  ASSESSMENT:  CLINICAL IMPRESSION: Pt's objective measures and LEFS score today indicate she is making good progress towards PT goals.  She has gained ankle ROM and strength, is able to stand full WB on L LE for increased time, and her gait mechanics on stairs are improving.  She should continue to benefit from skilled PT to address the remaining difficulty walking on uneven surfaces, strength deficits, and walking/standing longer distances and longer period of time >1 hour for house work and her yard care.  OBJECTIVE IMPAIRMENTS: Abnormal gait, decreased activity tolerance, decreased balance, difficulty walking, decreased ROM, decreased strength, and pain.   ACTIVITY LIMITATIONS: standing, squatting, stairs, and locomotion level  PARTICIPATION LIMITATIONS: meal prep, cleaning, laundry, interpersonal  relationship, shopping, community activity, and yard work  PERSONAL FACTORS: Time since onset of injury/illness/exacerbation are also affecting patient's functional outcome.   REHAB POTENTIAL: Excellent  CLINICAL DECISION MAKING:  Stable/uncomplicated  EVALUATION COMPLEXITY: Low   GOALS: Goals reviewed with patient? Yes  SHORT TERM GOALS: Target date: 02/25/24 Initiate HEP for ankle/foot ROM and strengthening Baseline: completed at initial evaluation Goal status: Met    LONG TERM GOALS: Target date: 04/08/24  Improve LEFS by 20 points indicating pt able to perform her daily activities without being as significantly limited by L ankle/foot  Baseline: 30/80; 7/14: 56/80 Goal status: In progress  2.  Improve ankle PF strength >1 MMT grade to facilitate improved ability to stand for her party planning and ambulate on even/uneven surfaces x 1 hour at a time Baseline: 2/5 strength; 7/14: able to perform unilateral L heel raise through partial ROM now (2+/5) Goal status: In progress  3.  Improve L SLS to >10 seconds to facilitate improved balance/decreased fall risk while standing and amb on uneven surfaces and on cruise ship when she travels later this summer wearing normal shoes Baseline: 2 sec; 7/14: 5 seconds Goal status: In progress   PLAN:  PT FREQUENCY: 2x/week  PT DURATION: 6 weeks  PLANNED INTERVENTIONS: 97110-Therapeutic exercises, 97530- Therapeutic activity, [id number]- Neuromuscular re-education, 97535- Self Care, and [id number]- Manual therapy  PLAN FOR NEXT SESSION: manual therapy, continue progressing LE strengthening/balance/HEP, re-cert needed 2/73/74  Vernell Reges, PT, DPT, OCS  Vernell FORBES Reges, PT 03/27/2024, 5:19 PM

## 2024-03-29 ENCOUNTER — Ambulatory Visit

## 2024-03-29 DIAGNOSIS — M722 Plantar fascial fibromatosis: Secondary | ICD-10-CM | POA: Diagnosis not present

## 2024-03-29 DIAGNOSIS — M25672 Stiffness of left ankle, not elsewhere classified: Secondary | ICD-10-CM

## 2024-03-29 NOTE — Therapy (Signed)
 OUTPATIENT PHYSICAL THERAPY LOWER EXTREMITY TREATMENTf   Patient Name: Paula [name] MRN: [id number] DOB:July 29, 1956, 68 y.o., female Today's Date: 03/29/2024  END OF SESSION:  PT End of Session - 03/29/24 1123     Visit Number 11    Number of Visits 14    Date for PT Re-Evaluation 04/08/24    Authorization Type 2x/week x 6 weeks (Medicare PN DONE at visit #89; re-cert needed 2/73/74)    PT Start Time 1115    PT Stop Time 1200    PT Time Calculation (min) 45 min    Activity Tolerance Patient tolerated treatment well    Behavior During Therapy [facility] for tasks assessed/performed           Past Medical History:  Diagnosis Date   Asthma    Environmental allergies    GERD (gastroesophageal reflux disease)    Hypertension    Migraine    Sinusitis    Past Surgical History:  Procedure Laterality Date   AUGMENTATION MAMMAPLASTY Bilateral 1987   HERNIA REPAIR  1962   skin cancer   08/11/2016   removal   TUBAL LIGATION  08/11/2016   Patient Active Problem List   Diagnosis Date Noted   Dermatochalasis of both upper eyelids 07/31/2021   Ptosis of both eyebrows 07/31/2021   Allergic rhinitis due to pollen 06/26/2020   Poison ivy dermatitis 05/26/2020   Facet syndrome, lumbar 05/26/2020   Hair loss 05/26/2020   Encounter for routine adult health examination with abnormal findings 09/25/2019   Routine cervical smear 09/25/2019   Screening for osteoporosis 09/25/2019   Essential hypertension 07/06/2019   Generalized anxiety disorder 07/06/2019   Elevated blood pressure reading in office without diagnosis of hypertension 04/06/2019   Generalized abdominal discomfort 03/23/2019   Screening for breast cancer 09/23/2018   Other fatigue 09/23/2018   Headache syndrome 09/23/2018   Vitamin D  deficiency 09/23/2018   Fever blister 09/23/2018   History of malignant melanoma of skin 09/23/2018   Acute upper respiratory infection 08/24/2018   Acute sinusitis 09/21/2017   Unspecified  otitis externa, bilateral 09/21/2017   Gastro-esophageal reflux disease without esophagitis 09/21/2017   Insomnia, unspecified 09/21/2017   Unspecified ovarian cyst, right side 09/21/2017   Unspecified ovarian cyst, left side 09/21/2017   Nasal mucositis (ulcerative) 09/21/2017   Allergic rhinitis, unspecified 09/21/2017   Cerebrovascular disease, unspecified 09/21/2017   Herpesviral infection, unspecified 09/21/2017   Varicella without complication 09/21/2017   Dysuria 09/21/2017   Abnormal weight gain 09/21/2017    PCP: Dr. Liana  REFERRING PROVIDER: Dr. Silva  REFERRING DIAG:  M72.2 (ICD-10-CM) - Plantar fasciitis of left foot  M62.462 (ICD-10-CM) - Gastrocnemius equinus, left    THERAPY DIAG:  Stiffness of left ankle, not elsewhere classified  Plantar fasciitis  Rationale for Evaluation and Treatment: Rehabilitation  ONSET DATE: March 2025  SUBJECTIVE:   SUBJECTIVE STATEMENT: Pt reports having L plantar faciitis; underwent surgery end of Feb or early March: plantar fascia release, gastroc lengthening, 5th toe alignment.  She was in a boot NWB x 4 weeks.  Started wearing shoes in April and bearing weight on foot.  She states she saw the doctor last week and is having numbness in her great toe/2nd toe region and will be having another surgery to remove the plate in her foot which is irritating a nerve.  C/o: difficulty going down stairs, difficulty with her balance, and difficulty with prolonged standing and walking.  Hasn't ventured out to walk in her yard, has been mainly  staying on concrete or flat surfaces.  PERTINENT HISTORY: Foot surgery  PAIN:  Are you having pain? Yes, parasthesias in L foot- 4/10  PRECAUTIONS: None  RED FLAGS: None   WEIGHT BEARING RESTRICTIONS: no  FALLS:  Has patient fallen in last 6 months? Yes. Number of falls 1, early on after surgery but none recently  LIVING ENVIRONMENT: Lives with: lives with their family Lives in:  House/apartment Stairs: flight of stairs to get upstairs to second floor of home; 4-5 to enter home with railing Has following equipment at home:   OCCUPATION: retired  PLOF: Independent  PATIENT GOALS: to be able to resume her normal activities again without being limited by L foot- this includes shopping, gardening, party planning, taking cruises, and enjoying spending time in her pool  NEXT MD VISIT: none reported today  OBJECTIVE:  Note: Objective measures were completed at Evaluation unless otherwise noted.  PATIENT SURVEYS:  LEFS 30/80  COGNITION: Overall cognitive status: Within functional limits for tasks assessed     SENSATION: WFL  EDEMA:  No significant difference R vs L figure 8  POSTURE/GAIT: pt amb with decreased WB on L in stance phase  PALPATION: Pt reports tingling/parasthesias 1-2 toe  LOWER EXTREMITY ROM:  Active ROM Right eval Left eval  Hip flexion    Hip extension    Hip abduction    Hip adduction    Hip internal rotation    Hip external rotation    Knee flexion    Knee extension    Ankle dorsiflexion 10 12  Ankle plantarflexion 30 25  Ankle inversion    Ankle eversion     (Blank rows = not tested) Great toe: L 30 DF, 0 PF, R 70 DF and 30 PF  LOWER EXTREMITY MMT:  MMT Right eval Left eval  Hip flexion    Hip extension    Hip abduction    Hip adduction    Hip internal rotation    Hip external rotation    Knee flexion    Knee extension    Ankle dorsiflexion 5 4  Ankle plantarflexion 4 2  Ankle inversion 5 4  Ankle eversion 5 4   (Blank rows = not tested)    FUNCTIONAL TESTS:  SLS: L 2 sec, R x 10 sec Mini squat: able to perform Not able to perform heel raise in standing on L                                                                                                                               TREATMENT DATE: 03/29/24  Subjective: Pt reports overall feeling like progress is being made with PT.  Surgery scheduled  for end of Aug to remove plate.  Has a cruise scheduled mid Aug- Aug 8th.  Would like to feel stronger before walking around on the cruise and/or uneven surfaces.  Pt reports no new complaints since last session; she has been working on  her balance at home.  Objective: Updated goals/goal reassessment today Gait: pt able to amb up/down stairs with reciprocal pattern now, improved ability to descend off L LE , nearly symmetrical Ankle AROM: DF 15 deg L, gr toe ext 10 deg Ankle MMT: PF 2+/5, DF: 5/5, inv 4+/5, eversion 4/5 SLS: L 5 seconds with shoes on and no UE support now (improved) LEFS: was 30 at initial eval; 56 today   Therapeutic Exercises: Nustep seat #8, level 4 x 10 minutes, no use of UE today focused on LE onl Standing calf stretch: 30 sec x 3- standing today with L foot behind, R in front Standing heel raises: 2x12, b/l, up 2/down L single x10 (very difficult) Standing squats to mat table 2x10, 2x10 with 8# weight front squat- not today Standing lateral step down for LE strengthening: 2x15 L on 6 inch height   Therapeutic Activities: Small hurdles: 5 laps forward- not today Step up- front 6 inch height x10x2- not today Step ups to standing on bosu 2x10, 5 sec holds for balance on top- not today  Stairs: up/down 8 laps- not today Step up/reverse step down onto bosu x LRRL, step up and over to R and to L x 15 ea, step up and over forward x 15 LRRL SLS on airex: 3-4 sec intervals Tandem walk forward/reverse x 5 laps on airex balance beam Lateral stepping on airex beam 4 laps Squat+lift 5 lb med ball from 6 inch step x10, then from floor x10 Star drill- mini lunges- forward, diagonal, lateral- lead with L and lead with R 10 rounds ea   Manual Therapy:  L Ankle mob with movement for DF 2x10 L TC AP mob, PA mob for DF and PF, Gr III/IV 30 second bouts  Manual ankle PF/DF stretch x 1 min ea, toe extension/flexion 1 min ea Manual stretch for L DF and plantar fascia: 1 min  intervals x 3 ea and STM STM L gastroc, plantar fascia L mainly today- not today  PATIENT EDUCATION:  Education details: PT POC/goals, gait mechanics on stairs, gait mechanics on flat surface Person educated: Patient Education method: Explanation, Demonstration, and Handouts Education comprehension: verbalized understanding  HOME EXERCISE PROGRAM: Access Code: [email or web address] URL: [email or web address] Date: 03/10/2024 Prepared by: Vernell Reges  Exercises - Standing Gastroc Stretch  - 2 x daily - 7 x weekly - 3 sets - 20 hold - Long Sitting Ankle Pumps  - 1 x daily - 7 x weekly - 3 sets - 10 reps - Single Leg Stance  - 1 x daily - 7 x weekly - Tandem Walking  - 1 x daily - 7 x weekly - Backward Tandem Walking  - 1 x daily - 7 x weekly  ASSESSMENT:  CLINICAL IMPRESSION: Pt's objective measures and LEFS score today indicate she is making good progress towards PT goals.  She has gained ankle ROM and strength, is able to stand full WB on L LE for increased time, and her gait mechanics on stairs are improving.  She should continue to benefit from skilled PT to address the remaining difficulty walking on uneven surfaces, strength deficits, and walking/standing longer distances and longer period of time >1 hour for house work and her yard care.  OBJECTIVE IMPAIRMENTS: Abnormal gait, decreased activity tolerance, decreased balance, difficulty walking, decreased ROM, decreased strength, and pain.   ACTIVITY LIMITATIONS: standing, squatting, stairs, and locomotion level  PARTICIPATION LIMITATIONS: meal prep, cleaning, laundry, interpersonal relationship, shopping, community activity, and yard work  PERSONAL FACTORS:  Time since onset of injury/illness/exacerbation are also affecting patient's functional outcome.   REHAB POTENTIAL: Excellent  CLINICAL DECISION MAKING: Stable/uncomplicated  EVALUATION COMPLEXITY: Low   GOALS: Goals reviewed with patient? Yes  SHORT TERM  GOALS: Target date: 02/25/24 Initiate HEP for ankle/foot ROM and strengthening Baseline: completed at initial evaluation Goal status: Met    LONG TERM GOALS: Target date: 04/08/24  Improve LEFS by 20 points indicating pt able to perform her daily activities without being as significantly limited by L ankle/foot  Baseline: 30/80; 7/14: 56/80 Goal status: In progress  2.  Improve ankle PF strength >1 MMT grade to facilitate improved ability to stand for her party planning and ambulate on even/uneven surfaces x 1 hour at a time Baseline: 2/5 strength; 7/14: able to perform unilateral L heel raise through partial ROM now (2+/5) Goal status: In progress  3.  Improve L SLS to >10 seconds to facilitate improved balance/decreased fall risk while standing and amb on uneven surfaces and on cruise ship when she travels later this summer wearing normal shoes Baseline: 2 sec; 7/14: 5 seconds Goal status: In progress   PLAN:  PT FREQUENCY: 2x/week  PT DURATION: 6 weeks  PLANNED INTERVENTIONS: 97110-Therapeutic exercises, 97530- Therapeutic activity, [id number]- Neuromuscular re-education, 97535- Self Care, and [id number]- Manual therapy  PLAN FOR NEXT SESSION: manual therapy, continue progressing LE strengthening/balance/HEP, re-cert needed 2/73/74  Vernell Reges, PT, DPT, OCS  Vernell FORBES Reges, PT 03/29/2024, 11:24 AM

## 2024-04-03 ENCOUNTER — Ambulatory Visit

## 2024-04-03 DIAGNOSIS — M25672 Stiffness of left ankle, not elsewhere classified: Secondary | ICD-10-CM

## 2024-04-03 DIAGNOSIS — M722 Plantar fascial fibromatosis: Secondary | ICD-10-CM

## 2024-04-03 NOTE — Therapy (Signed)
 OUTPATIENT PHYSICAL THERAPY LOWER EXTREMITY TREATMENTf   Patient Name: Paula [name] MRN: [id number] DOB:[birth year], 68 y.o., female Today's Date: 04/03/2024  END OF SESSION:  PT End of Session - 04/03/24 1204     Visit Number 12    Number of Visits 14    Date for PT Re-Evaluation 04/08/24    Authorization Type 2x/week x 6 weeks (Medicare PN DONE at visit #89; re-cert needed 2/73/74)    PT Start Time 1115    PT Stop Time 1200    PT Time Calculation (min) 45 min    Activity Tolerance Patient tolerated treatment well    Behavior During Therapy [facility] for tasks assessed/performed           Past Medical History:  Diagnosis Date   Asthma    Environmental allergies    GERD (gastroesophageal reflux disease)    Hypertension    Migraine    Sinusitis    Past Surgical History:  Procedure Laterality Date   AUGMENTATION MAMMAPLASTY Bilateral 1987   HERNIA REPAIR  1962   skin cancer   08/11/2016   removal   TUBAL LIGATION  08/11/2016   Patient Active Problem List   Diagnosis Date Noted   Dermatochalasis of both upper eyelids 07/31/2021   Ptosis of both eyebrows 07/31/2021   Allergic rhinitis due to pollen 06/26/2020   Poison ivy dermatitis 05/26/2020   Facet syndrome, lumbar 05/26/2020   Hair loss 05/26/2020   Encounter for routine adult health examination with abnormal findings 09/25/2019   Routine cervical smear 09/25/2019   Screening for osteoporosis 09/25/2019   Essential hypertension 07/06/2019   Generalized anxiety disorder 07/06/2019   Elevated blood pressure reading in office without diagnosis of hypertension 04/06/2019   Generalized abdominal discomfort 03/23/2019   Screening for breast cancer 09/23/2018   Other fatigue 09/23/2018   Headache syndrome 09/23/2018   Vitamin D  deficiency 09/23/2018   Fever blister 09/23/2018   History of malignant melanoma of skin 09/23/2018   Acute upper respiratory infection 08/24/2018   Acute sinusitis 09/21/2017   Unspecified  otitis externa, bilateral 09/21/2017   Gastro-esophageal reflux disease without esophagitis 09/21/2017   Insomnia, unspecified 09/21/2017   Unspecified ovarian cyst, right side 09/21/2017   Unspecified ovarian cyst, left side 09/21/2017   Nasal mucositis (ulcerative) 09/21/2017   Allergic rhinitis, unspecified 09/21/2017   Cerebrovascular disease, unspecified 09/21/2017   Herpesviral infection, unspecified 09/21/2017   Varicella without complication 09/21/2017   Dysuria 09/21/2017   Abnormal weight gain 09/21/2017    PCP: Dr. Liana  REFERRING PROVIDER: Dr. Silva  REFERRING DIAG:  M72.2 (ICD-10-CM) - Plantar fasciitis of left foot  M62.462 (ICD-10-CM) - Gastrocnemius equinus, left    THERAPY DIAG:  Stiffness of left ankle, not elsewhere classified  Plantar fasciitis  Rationale for Evaluation and Treatment: Rehabilitation  ONSET DATE: March 2025  SUBJECTIVE:   SUBJECTIVE STATEMENT: Pt reports having L plantar faciitis; underwent surgery end of Feb or early March: plantar fascia release, gastroc lengthening, 5th toe alignment.  She was in a boot NWB x 4 weeks.  Started wearing shoes in April and bearing weight on foot.  She states she saw the doctor last week and is having numbness in her great toe/2nd toe region and will be having another surgery to remove the plate in her foot which is irritating a nerve.  C/o: difficulty going down stairs, difficulty with her balance, and difficulty with prolonged standing and walking.  Hasn't ventured out to walk in her yard, has been mainly  staying on concrete or flat surfaces.  PERTINENT HISTORY: Foot surgery  PAIN:  Are you having pain? Yes, parasthesias in L foot- 4/10  PRECAUTIONS: None  RED FLAGS: None   WEIGHT BEARING RESTRICTIONS: no  FALLS:  Has patient fallen in last 6 months? Yes. Number of falls 1, early on after surgery but none recently  LIVING ENVIRONMENT: Lives with: lives with their family Lives in:  House/apartment Stairs: flight of stairs to get upstairs to second floor of home; 4-5 to enter home with railing Has following equipment at home:   OCCUPATION: retired  PLOF: Independent  PATIENT GOALS: to be able to resume her normal activities again without being limited by L foot- this includes shopping, gardening, party planning, taking cruises, and enjoying spending time in her pool  NEXT MD VISIT: none reported today  OBJECTIVE:  Note: Objective measures were completed at Evaluation unless otherwise noted.  PATIENT SURVEYS:  LEFS 30/80  COGNITION: Overall cognitive status: Within functional limits for tasks assessed     SENSATION: WFL  EDEMA:  No significant difference R vs L figure 8  POSTURE/GAIT: pt amb with decreased WB on L in stance phase  PALPATION: Pt reports tingling/parasthesias 1-2 toe  LOWER EXTREMITY ROM:  Active ROM Right eval Left eval  Hip flexion    Hip extension    Hip abduction    Hip adduction    Hip internal rotation    Hip external rotation    Knee flexion    Knee extension    Ankle dorsiflexion 10 12  Ankle plantarflexion 30 25  Ankle inversion    Ankle eversion     (Blank rows = not tested) Great toe: L 30 DF, 0 PF, R 70 DF and 30 PF  LOWER EXTREMITY MMT:  MMT Right eval Left eval  Hip flexion    Hip extension    Hip abduction    Hip adduction    Hip internal rotation    Hip external rotation    Knee flexion    Knee extension    Ankle dorsiflexion 5 4  Ankle plantarflexion 4 2  Ankle inversion 5 4  Ankle eversion 5 4   (Blank rows = not tested)    FUNCTIONAL TESTS:  SLS: L 2 sec, R x 10 sec Mini squat: able to perform Not able to perform heel raise in standing on L                                                                                                                               TREATMENT DATE: 04/03/24  Subjective: Pt reports overall feeling like progress is being made with PT.  Surgery scheduled  for end of Aug to remove plate.  Has a cruise scheduled mid Aug- Aug 8th.  Would like to feel stronger before walking around on the cruise and/or uneven surfaces.  Pt reports her balance is getting better. Did some work outside cleaning her  pool this morning before PT so arrives after some physical activity  Objective: Updated goals/goal reassessment 03/27/24 Gait: pt able to amb up/down stairs with reciprocal pattern now, improved ability to descend off L LE , nearly symmetrical Ankle AROM: DF 15 deg L, gr toe ext 10 deg Ankle MMT: PF 2+/5, DF: 5/5, inv 4+/5, eversion 4/5 SLS: L 5 seconds with shoes on and no UE support now (improved) LEFS: was 30 at initial eval; 56 today   Therapeutic Exercises: Nustep seat #8, level 4 x 10 minutes, no use of UE today focused on LE only- not today Standing calf stretch: 30 sec x 3- standing today with L foot behind, R in front Standing heel raises: 2x12, b/l, up 2/down L single x10  Standing squats to mat table 2x10, 2x10 with 8# weight front squat- not today Standing lateral step down for LE strengthening: 2x15 L on 6 inch height MRE for L ankle eversion with PT in long sitting- to improve strength/endurance of fibularis mm, 3 sets to fatigue (12 reps)   Therapeutic Activities: Small hurdles: 5 laps forward- not today Step up- front 6 inch height x10x2- not today Step ups to standing on bosu 2x10, 5 sec holds for balance on top- not today  Stairs: up/down 8 laps- reciprocal pattern and no railing needed Step up/reverse step down onto bosu x LRRL, step up and over to R and to L x 15 ea, step up and over forward x 15 LRRL- not today SLS- 5-610 sec interval- standing on towel barefoot today Tandem walk forward walk in hallway- 4x 25 ft Lateral stepping on airex beam 4 laps- not today Forward alternating marches to SLS with head turns R/L 4x20 ft Squat+lift 5 lb med ball from 6 inch step x10, then from floor x10 Star drill- mini lunges- forward,  diagonal, lateral, reverse- lead with L and lead with R 10 rounds ea- not today Carioca stepping R and L in hallway (20 ft), 4 laps ea, crossing foot over front and also behind   Manual Therapy: L Ankle mob with movement for DF 2x10 L TC AP mob, PA mob for DF and PF, Gr III/IV 30 second bouts  Manual ankle PF/DF stretch x 1 min ea, toe extension/flexion 1 min ea Manual stretch for L DF and plantar fascia: 1 min intervals x 3 ea and STM STM L gastroc, plantar fascia L mainly today- not today  PATIENT EDUCATION:  Education details: PT POC/goals, gait mechanics on stairs, gait mechanics on flat surface Person educated: Patient Education method: Explanation, Demonstration, and Handouts Education comprehension: verbalized understanding  HOME EXERCISE PROGRAM: Access Code: [email or web address] URL: [email or web address] Date: 03/10/2024 Prepared by: Vernell Reges  Exercises - Standing Gastroc Stretch  - 2 x daily - 7 x weekly - 3 sets - 20 hold - Long Sitting Ankle Pumps  - 1 x daily - 7 x weekly - 3 sets - 10 reps - Single Leg Stance  - 1 x daily - 7 x weekly - Tandem Walking  - 1 x daily - 7 x weekly - Backward Tandem Walking  - 1 x daily - 7 x weekly  ASSESSMENT:  CLINICAL IMPRESSION: Pt was challenged with balance exercises involving dual tasking in hallway today; able to regain balance with large stepping strategy or finger touch to wall for stabilization.  Improved coordination palpated with ankle eversion MRE today.  Would benefit from continuing to improve strength to facilitate improve ankle mm control during SLS activities. She has gained  ankle ROM and strength, is able to stand full WB on L LE for increased time, and her gait mechanics on stairs are improving.  She should continue to benefit from skilled PT to address the remaining difficulty walking on uneven surfaces, strength deficits, and walking/standing longer distances and longer period of time >1 hour for house work  and her yard care.  OBJECTIVE IMPAIRMENTS: Abnormal gait, decreased activity tolerance, decreased balance, difficulty walking, decreased ROM, decreased strength, and pain.   ACTIVITY LIMITATIONS: standing, squatting, stairs, and locomotion level  PARTICIPATION LIMITATIONS: meal prep, cleaning, laundry, interpersonal relationship, shopping, community activity, and yard work  PERSONAL FACTORS: Time since onset of injury/illness/exacerbation are also affecting patient's functional outcome.   REHAB POTENTIAL: Excellent  CLINICAL DECISION MAKING: Stable/uncomplicated  EVALUATION COMPLEXITY: Low   GOALS: Goals reviewed with patient? Yes  SHORT TERM GOALS: Target date: 02/25/24 Initiate HEP for ankle/foot ROM and strengthening Baseline: completed at initial evaluation Goal status: Met    LONG TERM GOALS: Target date: 04/08/24  Improve LEFS by 20 points indicating pt able to perform her daily activities without being as significantly limited by L ankle/foot  Baseline: 30/80; 7/14: 56/80 Goal status: In progress  2.  Improve ankle PF strength >1 MMT grade to facilitate improved ability to stand for her party planning and ambulate on even/uneven surfaces x 1 hour at a time Baseline: 2/5 strength; 7/14: able to perform unilateral L heel raise through partial ROM now (2+/5) Goal status: In progress  3.  Improve L SLS to >10 seconds to facilitate improved balance/decreased fall risk while standing and amb on uneven surfaces and on cruise ship when she travels later this summer wearing normal shoes Baseline: 2 sec; 7/14: 5 seconds Goal status: In progress   PLAN:  PT FREQUENCY: 2x/week  PT DURATION: 6 weeks  PLANNED INTERVENTIONS: 97110-Therapeutic exercises, 97530- Therapeutic activity, [id number]- Neuromuscular re-education, 97535- Self Care, and 02859- Manual therapy  PLAN FOR NEXT SESSION: manual therapy, continue progressing LE strengthening/balance/HEP, re-cert needed  04/08/24  Vernell Reges, PT, DPT, OCS  Vernell FORBES Reges, PT 04/03/2024, 12:06 PM

## 2024-04-05 ENCOUNTER — Ambulatory Visit

## 2024-04-06 ENCOUNTER — Ambulatory Visit

## 2024-04-06 DIAGNOSIS — M722 Plantar fascial fibromatosis: Secondary | ICD-10-CM | POA: Diagnosis not present

## 2024-04-06 DIAGNOSIS — M25672 Stiffness of left ankle, not elsewhere classified: Secondary | ICD-10-CM

## 2024-04-06 NOTE — Therapy (Signed)
 OUTPATIENT PHYSICAL THERAPY LOWER EXTREMITY TREATMENTf   Patient Name: Paula [name] MRN: [id number] DOB:[birth year], 68 y.o., female Today's Date: 04/06/2024  END OF SESSION:  PT End of Session - 04/06/24 1041     Visit Number 13    Number of Visits 14    Date for PT Re-Evaluation 04/08/24    Authorization Type 2x/week x 6 weeks (Medicare PN DONE at visit #89; re-cert needed 2/73/74)    PT Start Time 1035    PT Stop Time 1120    PT Time Calculation (min) 45 min    Activity Tolerance Patient tolerated treatment well    Behavior During Therapy WFL for tasks assessed/performed           Past Medical History:  Diagnosis Date   Asthma    Environmental allergies    GERD (gastroesophageal reflux disease)    Hypertension    Migraine    Sinusitis    Past Surgical History:  Procedure Laterality Date   AUGMENTATION MAMMAPLASTY Bilateral 1987   HERNIA REPAIR  1962   skin cancer   08/11/2016   removal   TUBAL LIGATION  08/11/2016   Patient Active Problem List   Diagnosis Date Noted   Dermatochalasis of both upper eyelids 07/31/2021   Ptosis of both eyebrows 07/31/2021   Allergic rhinitis due to pollen 06/26/2020   Poison ivy dermatitis 05/26/2020   Facet syndrome, lumbar 05/26/2020   Hair loss 05/26/2020   Encounter for routine adult health examination with abnormal findings 09/25/2019   Routine cervical smear 09/25/2019   Screening for osteoporosis 09/25/2019   Essential hypertension 07/06/2019   Generalized anxiety disorder 07/06/2019   Elevated blood pressure reading in office without diagnosis of hypertension 04/06/2019   Generalized abdominal discomfort 03/23/2019   Screening for breast cancer 09/23/2018   Other fatigue 09/23/2018   Headache syndrome 09/23/2018   Vitamin D  deficiency 09/23/2018   Fever blister 09/23/2018   History of malignant melanoma of skin 09/23/2018   Acute upper respiratory infection 08/24/2018   Acute sinusitis 09/21/2017   Unspecified  otitis externa, bilateral 09/21/2017   Gastro-esophageal reflux disease without esophagitis 09/21/2017   Insomnia, unspecified 09/21/2017   Unspecified ovarian cyst, right side 09/21/2017   Unspecified ovarian cyst, left side 09/21/2017   Nasal mucositis (ulcerative) 09/21/2017   Allergic rhinitis, unspecified 09/21/2017   Cerebrovascular disease, unspecified 09/21/2017   Herpesviral infection, unspecified 09/21/2017   Varicella without complication 09/21/2017   Dysuria 09/21/2017   Abnormal weight gain 09/21/2017    PCP: Dr. Liana  REFERRING PROVIDER: Dr. Silva  REFERRING DIAG:  M72.2 (ICD-10-CM) - Plantar fasciitis of left foot  M62.462 (ICD-10-CM) - Gastrocnemius equinus, left    THERAPY DIAG:  Stiffness of left ankle, not elsewhere classified  Plantar fasciitis  Rationale for Evaluation and Treatment: Rehabilitation  ONSET DATE: March 2025  SUBJECTIVE:   SUBJECTIVE STATEMENT: Pt reports having L plantar faciitis; underwent surgery end of Feb or early March: plantar fascia release, gastroc lengthening, 5th toe alignment.  She was in a boot NWB x 4 weeks.  Started wearing shoes in April and bearing weight on foot.  She states she saw the doctor last week and is having numbness in her great toe/2nd toe region and will be having another surgery to remove the plate in her foot which is irritating a nerve.  C/o: difficulty going down stairs, difficulty with her balance, and difficulty with prolonged standing and walking.  Hasn't ventured out to walk in her yard, has been mainly  staying on concrete or flat surfaces.  PERTINENT HISTORY: Foot surgery  PAIN:  Are you having pain? Yes, parasthesias in L foot- 4/10  PRECAUTIONS: None  RED FLAGS: None   WEIGHT BEARING RESTRICTIONS: no  FALLS:  Has patient fallen in last 6 months? Yes. Number of falls 1, early on after surgery but none recently  LIVING ENVIRONMENT: Lives with: lives with their family Lives in:  House/apartment Stairs: flight of stairs to get upstairs to second floor of home; 4-5 to enter home with railing Has following equipment at home:   OCCUPATION: retired  PLOF: Independent  PATIENT GOALS: to be able to resume her normal activities again without being limited by L foot- this includes shopping, gardening, party planning, taking cruises, and enjoying spending time in her pool  NEXT MD VISIT: none reported today  OBJECTIVE:  Note: Objective measures were completed at Evaluation unless otherwise noted.  PATIENT SURVEYS:  LEFS 30/80  COGNITION: Overall cognitive status: Within functional limits for tasks assessed     SENSATION: WFL  EDEMA:  No significant difference R vs L figure 8  POSTURE/GAIT: pt amb with decreased WB on L in stance phase  PALPATION: Pt reports tingling/parasthesias 1-2 toe  LOWER EXTREMITY ROM:  Active ROM Right eval Left eval  Hip flexion    Hip extension    Hip abduction    Hip adduction    Hip internal rotation    Hip external rotation    Knee flexion    Knee extension    Ankle dorsiflexion 10 12  Ankle plantarflexion 30 25  Ankle inversion    Ankle eversion     (Blank rows = not tested) Great toe: L 30 DF, 0 PF, R 70 DF and 30 PF  LOWER EXTREMITY MMT:  MMT Right eval Left eval  Hip flexion    Hip extension    Hip abduction    Hip adduction    Hip internal rotation    Hip external rotation    Knee flexion    Knee extension    Ankle dorsiflexion 5 4  Ankle plantarflexion 4 2  Ankle inversion 5 4  Ankle eversion 5 4   (Blank rows = not tested)    FUNCTIONAL TESTS:  SLS: L 2 sec, R x 10 sec Mini squat: able to perform Not able to perform heel raise in standing on L                                                                                                                               TREATMENT DATE: 04/06/24  Subjective: Pt reports overall feeling like progress is being made with PT.  Surgery scheduled  for end of Aug (Aug 22) for removal of plate on metatarsal.  She is going on a cruise on Aug 8th.  She is having 2 big family events the next few weeks.  Feels like her balance is improving, still having some difficulty  getting her great toe to stay down in SLS.  Would like to return to PT after surgery to remove the plate.  Objective: Updated goals/goal reassessment 03/27/24 Gait: pt able to amb up/down stairs with reciprocal pattern now, improved ability to descend off L LE , nearly symmetrical Ankle AROM: DF 15 deg L, gr toe ext 10 deg Ankle MMT: PF 2+/5, DF: 5/5, inv 4+/5, eversion 4/5 SLS: L 10 seconds with shoes on and no UE support now (improved) LEFS: was 30 at initial eval; 56 today   Therapeutic Exercises: Nustep seat #8, level 4 x 10 minutes, no use of UE today focused on LE only-  Standing calf stretch: 30 sec x 3- standing today with L foot behind, R in front Standing heel raises: 2x12, b/l, up 2/down L single x10  Standing squats to mat table 2x10, 2x10 with 8# weight front squat- not today Standing lateral step down for LE strengthening: 2x15 L on 6 inch height- not today MRE for L ankle eversion with PT in long sitting- to improve strength/endurance of fibularis mm, 3 sets to fatigue (12 reps)- not today     Applied KT tape to facilitate L fibularis longus mm- neuro muscular re-ed: SLS- 5-610 sec interval- standing on towel barefoot today SLS on airex barefoot with tactile cues for fibularis m L: 5-10 second intervals x 10 Tandem walk forward walk in hallway- 4x 25 ft Lateral stepping on airex beam 4 laps- not today Carioca stepping R and L in hallway (20 ft), 4 laps ea, crossing foot over front and also behind Reverse walking 3 laps in hallway 25 ft  Therapeutic Activities: Stairs: up/down 8 laps- reciprocal pattern and no railing needed Step up/reverse step down onto bosu x LRRL, step up and over to R and to L x 15 ea, step up and over forward x 15 LRR Forward  alternating marches to SLS with head turns R/L 4x20 ft Squat+lift 5 lb med ball from 6 inch step x10, then from floor x10 Star drill- mini lunges- forward, diagonal, lateral, reverse- lead with L and lead with R 10 rounds ea  Pt education for how to continue with HEP, shoe type/shoewear for her foot while on cruise to support foot  PATIENT EDUCATION:  Education details: PT POC/goals, gait mechanics on stairs, gait mechanics on flat surface Person educated: Patient Education method: Explanation, Demonstration, and Handouts Education comprehension: verbalized understanding  HOME EXERCISE PROGRAM: Access Code: [email or web address] URL: [email or web address] Date: 03/10/2024 Prepared by: Vernell Reges  Exercises - Standing Gastroc Stretch  - 2 x daily - 7 x weekly - 3 sets - 20 hold - Long Sitting Ankle Pumps  - 1 x daily - 7 x weekly - 3 sets - 10 reps - Single Leg Stance  - 1 x daily - 7 x weekly - Tandem Walking  - 1 x daily - 7 x weekly - Backward Tandem Walking  - 1 x daily - 7 x weekly  ASSESSMENT:  CLINICAL IMPRESSION: Pt is performing her daily activities at home with improved balance, improved endurance, and overall improved activity tolerance.  She is not having difficulty on the stairs anymore.  She does still have difficulty with SLS.  Tolerated KT tape on fibularis well today; kept on through session as tactile feedback for mm activation during exercises.  Overall, she has gained ankle ROM and strength, is able to stand full WB on L LE for increased time, and her gait mechanics on stairs are improving.  She  plans to continue working on her HEP until her follow up surgery on Aug 22nd.  At that time if she needs to return to PT to address impairments/activity limitations I will re-evaluate and update her POC.    OBJECTIVE IMPAIRMENTS: Abnormal gait, decreased activity tolerance, decreased balance, difficulty walking, decreased ROM, decreased strength, and pain.   ACTIVITY  LIMITATIONS: standing, squatting, stairs, and locomotion level  PARTICIPATION LIMITATIONS: meal prep, cleaning, laundry, interpersonal relationship, shopping, community activity, and yard work  PERSONAL FACTORS: Time since onset of injury/illness/exacerbation are also affecting patient's functional outcome.   REHAB POTENTIAL: Excellent  CLINICAL DECISION MAKING: Stable/uncomplicated  EVALUATION COMPLEXITY: Low   GOALS: Goals reviewed with patient? Yes  SHORT TERM GOALS: Target date: 02/25/24 Initiate HEP for ankle/foot ROM and strengthening Baseline: completed at initial evaluation Goal status: Met    LONG TERM GOALS: Target date: 04/08/24  Improve LEFS by 20 points indicating pt able to perform her daily activities without being as significantly limited by L ankle/foot  Baseline: 30/80; 7/14: 56/80 Goal status: In progress  2.  Improve ankle PF strength >1 MMT grade to facilitate improved ability to stand for her party planning and ambulate on even/uneven surfaces x 1 hour at a time Baseline: 2/5 strength; 7/14: able to perform unilateral L heel raise through partial ROM now (2+/5); 7/24: same 2+/5 Goal status: In progress  3.  Improve L SLS to >10 seconds to facilitate improved balance/decreased fall risk while standing and amb on uneven surfaces and on cruise ship when she travels later this summer wearing normal shoes Baseline: 2 sec; 7/14: 5 seconds; 7/24: 10 seconds- with shoes on Goal status: met   PLAN:  PT FREQUENCY: 2x/week  PT DURATION: 6 weeks  PLANNED INTERVENTIONS: 97110-Therapeutic exercises, 97530- Therapeutic activity, 97112- Neuromuscular re-education, 97535- Self Care, and 02859- Manual therapy  PLAN FOR NEXT SESSION: pt will continue with HEP independently for next few weeks while traveling and having family functions leading up to her surgery (Aug 22).  Recommend she return to PT after her next surgery to address impairments with walking, balance, ROM,  and strength.   Vernell Reges, PT, DPT, OCS  Vernell FORBES Reges, PT 04/06/2024, 10:42 AM

## 2024-04-28 ENCOUNTER — Telehealth: Payer: Self-pay | Admitting: Podiatry

## 2024-04-28 NOTE — Telephone Encounter (Signed)
 DOS- 05/12/2024  REMOVAL FIXATION DEEP KWIRE SCREW LT- 20680  [employer] EFFECTIVE DATE- 09/14/2021  DEDUCTIBLE- N/A OOP- $3249 REMAINING- $5843.36 COINSURANCE- 0%/$400 COPAY  PER [employer]AILITY AND COHERE WEBSITE, NO PRIOR AUTH IS REQUIRED FOR CPT CODE 79319. DOCUMENTATION ATTACHED TO SURGERY CONSENT PACKET.

## 2024-05-01 ENCOUNTER — Encounter: Payer: Self-pay | Admitting: Nurse Practitioner

## 2024-05-01 ENCOUNTER — Ambulatory Visit (INDEPENDENT_AMBULATORY_CARE_PROVIDER_SITE_OTHER): Admitting: Nurse Practitioner

## 2024-05-01 VITALS — BP 138/88 | HR 72 | Temp 97.2°F | Resp 16 | Ht 63.0 in | Wt 180.2 lb

## 2024-05-01 DIAGNOSIS — G4700 Insomnia, unspecified: Secondary | ICD-10-CM

## 2024-05-01 DIAGNOSIS — I10 Essential (primary) hypertension: Secondary | ICD-10-CM

## 2024-05-01 DIAGNOSIS — K219 Gastro-esophageal reflux disease without esophagitis: Secondary | ICD-10-CM | POA: Diagnosis not present

## 2024-05-01 DIAGNOSIS — F411 Generalized anxiety disorder: Secondary | ICD-10-CM | POA: Diagnosis not present

## 2024-05-01 MED ORDER — LORAZEPAM 1 MG PO TABS: 1.0000 mg | ORAL_TABLET | Freq: Every evening | ORAL | 2 refills | Status: DC | PRN

## 2024-05-01 NOTE — Progress Notes (Signed)
 [facility] [address] [place], [place] [address]  Internal MEDICINE  Office Visit Note  Patient Name: Paula [name]  [id number]  [id number]  Date of Service: 05/01/2024  Chief Complaint  Patient presents with   Gastroesophageal Reflux   Hypertension   Follow-up    HPI N[place]ara presents for a follow-up visit for hypertension, GERD, anxiety and insomnia.  Anxiety and sleep -- doing better with increased dose of lorazepam .  Hypertension -- stable without medications currently.  GERD -- takes otc medication, no issues    Current Medication: Outpatient Encounter Medications as of 05/01/2024  Medication Sig   diclofenac  (VOLTAREN ) 75 MG EC tablet Take 1 tablet (75 mg total) by mouth 2 (two) times daily.   EPINEPHrine  0.3 mg/0.3 mL IJ SOAJ injection Inject 0.3 mg into the muscle as needed for anaphylaxis.   fexofenadine-pseudoephedrine (ALLEGRA-D 24) 180-240 MG 24 hr tablet Take 1 tablet by mouth daily.   fluticasone  (FLONASE ) 50 MCG/ACT nasal spray Place 2 sprays into both nostrils daily.   LORazepam  (ATIVAN ) 1 MG tablet Take 1 tablet (1 mg total) by mouth at bedtime as needed for anxiety or sleep.   mupirocin  ointment (BACTROBAN ) 2 % Apply 1 Application topically 2 (two) times daily as needed.   predniSONE  (STERAPRED UNI-PAK 21 TAB) 10 MG (21) TBPK tablet Use as directed for 6 days, start tomorrow 03/23/24   triamcinolone  cream (KENALOG ) 0.1 % Apply 1 Application topically 2 (two) times daily as needed (itchy insect stings).   [DISCONTINUED] LORazepam  (ATIVAN ) 1 MG tablet Take 1 tablet (1 mg total) by mouth at bedtime as needed for anxiety or sleep.   No facility-administered encounter medications on file as of 05/01/2024.    Surgical History: Past Surgical History:  Procedure Laterality Date   AUGMENTATION MAMMAPLASTY Bilateral 1987   HERNIA REPAIR  1962   skin cancer   08/11/2016   removal   TUBAL LIGATION  08/11/2016    Medical History: Past Medical History:   Diagnosis Date   Asthma    Environmental allergies    GERD (gastroesophageal reflux disease)    Hypertension    Migraine    Sinusitis     Family History: Family History  Problem Relation Age of Onset   Breast cancer Cousin    COPD Mother    Lung cancer Mother    Hypertension Mother    Ulcers Father    Bowel Disease Father        blockage, sepsis    Social History   Socioeconomic History   Marital status: Married    Spouse name: Not on file   Number of children: Not on file   Years of education: Not on file   Highest education level: Not on file  Occupational History   Not on file  Tobacco Use   Smoking status: Former    Current packs/day: 0.00    Types: Cigarettes    Quit date: 12/01/2004    Years since quitting: 19.4   Smokeless tobacco: Never  Vaping Use   Vaping status: Never Used  Substance and Sexual Activity   Alcohol use: Yes    Comment: ocassionally   Drug use: No   Sexual activity: Not on file  Other Topics Concern   Not on file  Social History Narrative   Not on file   Social Drivers of Health   Financial Resource Strain: Not on file  Food Insecurity: Not on file  Transportation Needs: Not on file  Physical Activity: Not  on file  Stress: Not on file  Social Connections: Not on file  Intimate Partner Violence: Not on file      Review of Systems  Constitutional:  Negative for chills, fatigue and unexpected weight change.  HENT:  Negative for congestion, postnasal drip, rhinorrhea, sneezing and sore throat.   Eyes:  Negative for redness.  Respiratory:  Negative for cough, chest tightness and shortness of breath.   Cardiovascular:  Negative for chest pain and palpitations.  Gastrointestinal:  Negative for diarrhea, nausea and vomiting.  Genitourinary:  Negative for dysuria and frequency.  Musculoskeletal:  Negative for arthralgias, back pain, joint swelling and neck pain.  Skin:  Negative for rash.  Neurological: Negative.  Negative for  tremors and numbness.  Hematological:  Negative for adenopathy. Does not bruise/bleed easily.  Psychiatric/Behavioral:  Negative for behavioral problems (Depression), sleep disturbance and suicidal ideas. The patient is not nervous/anxious.     Vital Signs: BP 138/88   Pulse 72   Temp (!) 97.2 F (36.2 C)   Resp 16   Ht 5' 3 (1.6 m)   Wt 180 lb 3.2 oz (81.7 kg)   SpO2 95%   BMI 31.92 kg/m    Physical Exam Vitals and nursing note reviewed.  Constitutional:      General: She is not in acute distress.    Appearance: Normal appearance. She is obese. She is not ill-appearing.  HENT:     Head: Normocephalic and atraumatic.  Eyes:     Pupils: Pupils are equal, round, and reactive to light.  Cardiovascular:     Rate and Rhythm: Normal rate and regular rhythm.  Pulmonary:     Effort: Pulmonary effort is normal. No respiratory distress.  Musculoskeletal:        General: Normal range of motion.  Skin:    General: Skin is warm and dry.  Neurological:     Mental Status: She is alert and oriented to person, place, and time.  Psychiatric:        Mood and Affect: Mood normal.        Behavior: Behavior normal.        Assessment/Plan: 1. Essential hypertension (Primary) Stable without medication  at this time.  2. Gastro-esophageal reflux disease without esophagitis No issues, takes medication as needed over the counter  3. Generalized anxiety disorder Continue lorazepam  prn as prescribed. Follow up in 3 months for additional refills.  - LORazepam  (ATIVAN ) 1 MG tablet; Take 1 tablet (1 mg total) by mouth at bedtime as needed for anxiety or sleep.  Dispense: 30 tablet; Refill: 2  4. Insomnia, unspecified type Takes lorazepam  as needed for sleep and anxiety, continue as prescribed. Refills ordered    General Counseling: ellayna hilligoss understanding of the findings of todays visit and agrees with plan of treatment. I have discussed any further diagnostic evaluation that  may be needed or ordered today. We also reviewed her medications today. she has been encouraged to call the office with any questions or concerns that should arise related to todays visit.    No orders of the defined types were placed in this encounter.   Meds ordered this encounter  Medications   LORazepam  (ATIVAN ) 1 MG tablet    Sig: Take 1 tablet (1 mg total) by mouth at bedtime as needed for anxiety or sleep.    Dispense:  30 tablet    Refill:  2    For future refills, due now    Return in about 3 months (  around 07/25/2024) for F/U, anxiety med refill, Jurney Overacker PCP.   Total time spent:30 Minutes Time spent includes review of chart, medications, test results, and follow up plan with the patient.   [place] Controlled Substance Database was reviewed by me.  This patient was seen by Mardy Maxin, FNP-C in collaboration with Dr. Sigrid Bathe as a part of collaborative care agreement.   Petros Ahart R. Maxin, MSN, FNP-C Internal medicine

## 2024-05-03 ENCOUNTER — Ambulatory Visit: Admitting: Podiatry

## 2024-05-04 ENCOUNTER — Other Ambulatory Visit: Payer: Self-pay | Admitting: Podiatry

## 2024-05-04 MED ORDER — ACETAMINOPHEN 500 MG PO TABS: 1000.0000 mg | ORAL_TABLET | Freq: Four times a day (QID) | ORAL | 0 refills | Status: AC | PRN

## 2024-05-04 MED ORDER — IBUPROFEN 600 MG PO TABS: 600.0000 mg | ORAL_TABLET | Freq: Four times a day (QID) | ORAL | 0 refills | Status: AC | PRN

## 2024-05-04 MED ORDER — TRAMADOL HCL 50 MG PO TABS
50.0000 mg | ORAL_TABLET | Freq: Four times a day (QID) | ORAL | 0 refills | Status: AC | PRN
Start: 2024-05-04 — End: 2024-05-09

## 2024-05-05 DIAGNOSIS — T8484XA Pain due to internal orthopedic prosthetic devices, implants and grafts, initial encounter: Secondary | ICD-10-CM | POA: Diagnosis not present

## 2024-05-05 DIAGNOSIS — Z4889 Encounter for other specified surgical aftercare: Secondary | ICD-10-CM | POA: Diagnosis not present

## 2024-05-10 ENCOUNTER — Ambulatory Visit (INDEPENDENT_AMBULATORY_CARE_PROVIDER_SITE_OTHER)

## 2024-05-10 ENCOUNTER — Ambulatory Visit (INDEPENDENT_AMBULATORY_CARE_PROVIDER_SITE_OTHER): Admitting: Podiatry

## 2024-05-10 VITALS — Ht 63.0 in | Wt 180.0 lb

## 2024-05-10 DIAGNOSIS — T8484XA Pain due to internal orthopedic prosthetic devices, implants and grafts, initial encounter: Secondary | ICD-10-CM

## 2024-05-10 DIAGNOSIS — M722 Plantar fascial fibromatosis: Secondary | ICD-10-CM

## 2024-05-10 DIAGNOSIS — Z9889 Other specified postprocedural states: Secondary | ICD-10-CM

## 2024-05-12 ENCOUNTER — Encounter: Payer: Self-pay | Admitting: Nurse Practitioner

## 2024-05-14 NOTE — Progress Notes (Signed)
  Subjective:  Patient ID: Paula [name], female    DOB: 09-04-1956,  MRN: [id number]  Chief Complaint  Patient presents with   Post-op Follow-up    RM 3 POV # 1 DOS 05/05/24 LT FOOT HARDWARE REMOVAL. Patient states some intermittent pain. Surgical site healing well, all sutures are intact with minimum swelling.     68 y.o. female returns for post-op check.   Review of Systems: Negative except as noted in the HPI. Denies N/V/F/Ch.   Objective:  There were no vitals filed for this visit. Body mass index is 31.89 kg/m. Constitutional Well developed. Well nourished.  Vascular Foot warm and well perfused. Capillary refill normal to all digits.  Calf is soft and supple, no posterior calf or knee pain, negative Homans' sign  Neurologic Normal speech. Oriented to person, place, and time. Epicritic sensation to light touch grossly present bilaterally.  Dermatologic Skin healing well without signs of infection. Skin edges well coapted without signs of infection.  Orthopedic: Tenderness to palpation noted about the surgical site.   Multiple view plain film radiographs: no complications noted Assessment:   1. Pain due to internal orthopedic prosthetic devices, implants and grafts, initial encounter [facility])    Plan:  Patient was evaluated and treated and all questions answered.  S/p foot surgery left -Progressing as expected post-operatively. -XR: no issues -WB Status: wbat in sx shoe -Sutures: remove in 2 weeks. -Medications: no refills needed -Foot redressed.  No follow-ups on file.

## 2024-05-17 ENCOUNTER — Encounter: Admitting: Podiatry

## 2024-05-18 ENCOUNTER — Ambulatory Visit (INDEPENDENT_AMBULATORY_CARE_PROVIDER_SITE_OTHER): Admitting: Physician Assistant

## 2024-05-18 ENCOUNTER — Encounter: Payer: Self-pay | Admitting: Physician Assistant

## 2024-05-18 VITALS — BP 142/88 | HR 82 | Temp 98.0°F | Resp 16 | Ht 63.0 in | Wt 183.0 lb

## 2024-05-18 DIAGNOSIS — N390 Urinary tract infection, site not specified: Secondary | ICD-10-CM | POA: Diagnosis not present

## 2024-05-18 DIAGNOSIS — R319 Hematuria, unspecified: Secondary | ICD-10-CM | POA: Diagnosis not present

## 2024-05-18 DIAGNOSIS — R300 Dysuria: Secondary | ICD-10-CM

## 2024-05-18 LAB — POCT URINALYSIS DIPSTICK
Bilirubin, UA: NEGATIVE
Glucose, UA: NEGATIVE
Ketones, UA: NEGATIVE
Nitrite, UA: NEGATIVE
Protein, UA: NEGATIVE
Spec Grav, UA: 1.01 (ref 1.010–1.025)
Urobilinogen, UA: 0.2 U/dL
pH, UA: 6 (ref 5.0–8.0)

## 2024-05-18 MED ORDER — NITROFURANTOIN MONOHYD MACRO 100 MG PO CAPS: ORAL_CAPSULE | ORAL | 0 refills | Status: DC

## 2024-05-18 NOTE — Progress Notes (Signed)
 [facility] [address] [place], [place] [address]  Internal MEDICINE  Office Visit Note  Patient Name: Paula [name]  [id number]  [id number]  Date of Service: 05/18/2024  Chief Complaint  Patient presents with   Acute Visit   Urinary Tract Infection   Urinary Frequency     HPI Pt is here for a sick visit. -Burning with urination and frequency started Tuesday -has been getting worse. Odor to urine as well -hx of UTIs -reports BP always higher in office and monitors at home  Current Medication:  Outpatient Encounter Medications as of 05/18/2024  Medication Sig   acetaminophen  (TYLENOL ) 500 MG tablet Take 2 tablets (1,000 mg total) by mouth every 6 (six) hours as needed for up to 14 days (pain).   diclofenac  (VOLTAREN ) 75 MG EC tablet Take 1 tablet (75 mg total) by mouth 2 (two) times daily.   EPINEPHrine  0.3 mg/0.3 mL IJ SOAJ injection Inject 0.3 mg into the muscle as needed for anaphylaxis.   fexofenadine-pseudoephedrine (ALLEGRA-D 24) 180-240 MG 24 hr tablet Take 1 tablet by mouth daily.   fluticasone  (FLONASE ) 50 MCG/ACT nasal spray Place 2 sprays into both nostrils daily.   ibuprofen  (ADVIL ) 600 MG tablet Take 1 tablet (600 mg total) by mouth every 6 (six) hours as needed for up to 14 days.   LORazepam  (ATIVAN ) 1 MG tablet Take 1 tablet (1 mg total) by mouth at bedtime as needed for anxiety or sleep.   mupirocin  ointment (BACTROBAN ) 2 % Apply 1 Application topically 2 (two) times daily as needed.   nitrofurantoin , macrocrystal-monohydrate, (MACROBID ) 100 MG capsule Take 1 cap twice per day for 10 days.   predniSONE  (STERAPRED UNI-PAK 21 TAB) 10 MG (21) TBPK tablet Use as directed for 6 days, start tomorrow 03/23/24   triamcinolone  cream (KENALOG ) 0.1 % Apply 1 Application topically 2 (two) times daily as needed (itchy insect stings).   No facility-administered encounter medications on file as of 05/18/2024.      Medical History: Past Medical History:   Diagnosis Date   Asthma    Environmental allergies    GERD (gastroesophageal reflux disease)    Hypertension    Migraine    Sinusitis      Vital Signs: BP (!) 142/88 Comment: 150/80  Pulse 82   Temp 98 F (36.7 C)   Resp 16   Ht 5' 3 (1.6 m)   Wt 183 lb (83 kg)   SpO2 95%   BMI 32.42 kg/m    Review of Systems  Constitutional:  Negative for fatigue and fever.  HENT:  Negative for congestion, mouth sores and postnasal drip.   Respiratory:  Negative for cough.   Cardiovascular:  Negative for chest pain.  Genitourinary:  Positive for dysuria and frequency. Negative for flank pain.  Psychiatric/Behavioral: Negative.      Physical Exam Vitals and nursing note reviewed.  Constitutional:      General: She is not in acute distress.    Appearance: Normal appearance. She is obese. She is not ill-appearing.  HENT:     Head: Normocephalic and atraumatic.  Eyes:     Extraocular Movements: Extraocular movements intact.  Cardiovascular:     Rate and Rhythm: Normal rate and regular rhythm.  Pulmonary:     Effort: Pulmonary effort is normal. No respiratory distress.  Skin:    General: Skin is warm and dry.  Neurological:     Mental Status: She is alert and oriented to person, place, and time.  Psychiatric:        Mood and Affect: Mood normal.        Behavior: Behavior normal.       Assessment/Plan: 1. Urinary tract infection with hematuria, site unspecified (Primary) Will treat with macrobid  and adjust based on C/S. Drink plenty of water - CULTURE, URINE COMPREHENSIVE - nitrofurantoin , macrocrystal-monohydrate, (MACROBID ) 100 MG capsule; Take 1 cap twice per day for 10 days.  Dispense: 20 capsule; Refill: 0  2. Dysuria - POCT Urinalysis Dipstick   General Counseling: machelle raybon understanding of the findings of todays visit and agrees with plan of treatment. I have discussed any further diagnostic evaluation that may be needed or ordered today. We also  reviewed her medications today. she has been encouraged to call the office with any questions or concerns that should arise related to todays visit.    Counseling:    Orders Placed This Encounter  Procedures   CULTURE, URINE COMPREHENSIVE   POCT Urinalysis Dipstick    Meds ordered this encounter  Medications   nitrofurantoin , macrocrystal-monohydrate, (MACROBID ) 100 MG capsule    Sig: Take 1 cap twice per day for 10 days.    Dispense:  20 capsule    Refill:  0    Time spent:25 Minutes

## 2024-05-24 ENCOUNTER — Ambulatory Visit (INDEPENDENT_AMBULATORY_CARE_PROVIDER_SITE_OTHER): Admitting: Podiatry

## 2024-05-24 ENCOUNTER — Ambulatory Visit (INDEPENDENT_AMBULATORY_CARE_PROVIDER_SITE_OTHER)

## 2024-05-24 DIAGNOSIS — M2011 Hallux valgus (acquired), right foot: Secondary | ICD-10-CM

## 2024-05-24 DIAGNOSIS — M21611 Bunion of right foot: Secondary | ICD-10-CM | POA: Diagnosis not present

## 2024-05-24 DIAGNOSIS — M7751 Other enthesopathy of right foot: Secondary | ICD-10-CM

## 2024-05-24 NOTE — Progress Notes (Signed)
  Subjective:  Patient ID: Paula [name], female    DOB: March 21, 1956,  MRN: [id number]  Chief Complaint  Patient presents with   Routine Post Op    She denies and pain. Site looks clean with no discharge    68 y.o. female presents with the above complaint. History confirmed with patient.  She is doing well from her left foot surgery.  Her right foot bunion is painful and she is ready to look into surgical correction of this.  She has tried wider shoes and this has not helped as well as anti-inflammatories.  She is not usually outcome of her left foot hallux valgus surgery  Objective:  Physical Exam: warm, good capillary refill, no trophic changes or ulcerative lesions, normal DP and PT pulses, normal sensory exam Left Foot: Incision is well-healed and not hypertrophic with slight limited range of motion of MTP joint Right Foot: Moderate to severe hallux valgus deformity hypermobility of the first tarsometatarsal joint good range of motion of the MTP joint without pain in the first MTP.  There is tenderness along the medial bunion  No images are attached to the encounter.  Radiographs: Multiple views x-ray of the right foot: New right foot x-rays taken today show moderate to severe hallux valgus torted large dorsal medial bunion and increased first intermetatarsal angle Assessment:   1. Hallux valgus with bunions of right foot      Plan:  Patient was evaluated and treated and all questions answered.  Regarding her left foot surgery this is well-healed and her sutures were removed uneventfully today.  She would like to restart PT for range of motion of the MTP joint mobilization of the toes which has helped previously.  Referral sent to [facility] and [facility].  Following her right foot surgery she will pause this briefly and then restart after.  Discussed the etiology and treatment including surgical and non surgical treatment for painful bunions on her right foot, here she does  not have any painful corns plantar fasciitis or hammertoes. She  has exhausted all non surgical treatment prior to this visit including shoe gear changes and padding. She desires surgical intervention. We discussed all risks including but not limited to: pain, swelling, infection, scar, numbness which may be temporary or permanent, chronic pain, stiffness, nerve pain or damage, wound healing problems, bone healing problems including delayed or non-union and recurrence. Specifically we discussed the following procedures: Lapidus bunionectomy with Akin osteotomy of the right foot. Informed consent was signed today. Surgery will be scheduled at a mutually agreeable date. Information regarding this will be forwarded to our surgery scheduler. In the interim until surgery I recommended utilizing as wide of shoes as possible, take NSAIDs or tylenol  as tolerated for pain, and a bunion padding shield which can be purchased online.   Surgical plan:  Procedure: - Right foot Lapidus bunionectomy with Aiken osteotomy  Location: - [facility]  Anesthesia plan: - Sedation with regional block  Postoperative pain plan: - Tylenol  1000 mg every 6 hours, ibuprofen  600 mg every 6 hours, gabapentin  300 mg every 8 hours x5 days, oxycodone  5 mg 1-2 tabs every 6 hours only as needed  DVT prophylaxis: - None required  WB Restrictions / DME needs: - NWB in CAM boot postop with knee scooter  No follow-ups on file.

## 2024-05-25 LAB — CULTURE, URINE COMPREHENSIVE

## 2024-05-26 ENCOUNTER — Ambulatory Visit: Payer: Self-pay | Admitting: Physician Assistant

## 2024-05-26 NOTE — Telephone Encounter (Signed)
-----   Message from Paula [name] Pro sent at 05/26/2024 11:00 AM EDT ----- Please let her know her culture showed UTI and she is already on appropriate ABX ----- Message ----- From: Tobie Height Sent: 05/18/2024   9:10 AM EDT To: Paula [name] Pro, PA-C

## 2024-05-26 NOTE — Telephone Encounter (Signed)
 Spoke with patient regarding urine culture.

## 2024-05-29 ENCOUNTER — Ambulatory Visit: Attending: Podiatry

## 2024-05-29 DIAGNOSIS — M25675 Stiffness of left foot, not elsewhere classified: Secondary | ICD-10-CM | POA: Insufficient documentation

## 2024-05-29 DIAGNOSIS — M25672 Stiffness of left ankle, not elsewhere classified: Secondary | ICD-10-CM | POA: Insufficient documentation

## 2024-05-29 DIAGNOSIS — R29898 Other symptoms and signs involving the musculoskeletal system: Secondary | ICD-10-CM | POA: Insufficient documentation

## 2024-05-29 NOTE — Therapy (Signed)
 OUTPATIENT PHYSICAL THERAPY LOWER EXTREMITY TREATMENTf   Patient Name: Paula [name] MRN: [id number] DOB:10-21-1955, 68 y.o., female Today's Date: 05/29/2024  END OF SESSION:     Past Medical History:  Diagnosis Date   Asthma    Environmental allergies    GERD (gastroesophageal reflux disease)    Hypertension    Migraine    Sinusitis    Past Surgical History:  Procedure Laterality Date   AUGMENTATION MAMMAPLASTY Bilateral 1987   HERNIA REPAIR  1962   skin cancer   08/11/2016   removal   TUBAL LIGATION  08/11/2016   Patient Active Problem List   Diagnosis Date Noted   Dermatochalasis of both upper eyelids 07/31/2021   Ptosis of both eyebrows 07/31/2021   Allergic rhinitis due to pollen 06/26/2020   Poison ivy dermatitis 05/26/2020   Facet syndrome, lumbar 05/26/2020   Hair loss 05/26/2020   Encounter for routine adult health examination with abnormal findings 09/25/2019   Routine cervical smear 09/25/2019   Screening for osteoporosis 09/25/2019   Essential hypertension 07/06/2019   Generalized anxiety disorder 07/06/2019   Elevated blood pressure reading in office without diagnosis of hypertension 04/06/2019   Generalized abdominal discomfort 03/23/2019   Screening for breast cancer 09/23/2018   Other fatigue 09/23/2018   Headache syndrome 09/23/2018   Vitamin D  deficiency 09/23/2018   Fever blister 09/23/2018   History of malignant melanoma of skin 09/23/2018   Acute upper respiratory infection 08/24/2018   Acute sinusitis 09/21/2017   Unspecified otitis externa, bilateral 09/21/2017   Gastro-esophageal reflux disease without esophagitis 09/21/2017   Insomnia, unspecified 09/21/2017   Unspecified ovarian cyst, right side 09/21/2017   Unspecified ovarian cyst, left side 09/21/2017   Nasal mucositis (ulcerative) 09/21/2017   Allergic rhinitis, unspecified 09/21/2017   Cerebrovascular disease, unspecified 09/21/2017   Herpesviral infection, unspecified  09/21/2017   Varicella without complication 09/21/2017   Dysuria 09/21/2017   Abnormal weight gain 09/21/2017    PCP: Dr. Liana  REFERRING PROVIDER: Dr. Silva  REFERRING DIAG:  M72.2 (ICD-10-CM) - Plantar fasciitis of left foot  M62.462 (ICD-10-CM) - Gastrocnemius equinus, left    THERAPY DIAG:  No diagnosis found.  Rationale for Evaluation and Treatment: Rehabilitation  ONSET DATE: March 2025  SUBJECTIVE:   SUBJECTIVE STATEMENT: Pt reports having L plantar faciitis; underwent surgery end of Feb or early March: plantar fascia release, gastroc lengthening, 5th toe alignment.  She was in a boot NWB x 4 weeks.  Started wearing shoes in April and bearing weight on foot.  She states she saw the doctor last week and is having numbness in her great toe/2nd toe region and will be having another surgery to remove the plate in her foot which is irritating a nerve.  C/o: difficulty going down stairs, difficulty with her balance, and difficulty with prolonged standing and walking.  Hasn't ventured out to walk in her yard, has been mainly staying on concrete or flat surfaces.  PERTINENT HISTORY: Foot surgery  PAIN:  Are you having pain? Yes, parasthesias in L foot- 4/10  PRECAUTIONS: None  RED FLAGS: None   WEIGHT BEARING RESTRICTIONS: no  FALLS:  Has patient fallen in last 6 months? Yes. Number of falls 1, early on after surgery but none recently  LIVING ENVIRONMENT: Lives with: lives with their family Lives in: House/apartment Stairs: flight of stairs to get upstairs to second floor of home; 4-5 to enter home with railing Has following equipment at home:   OCCUPATION: retired  PLOF: Independent  PATIENT  GOALS: to be able to resume her normal activities again without being limited by L foot- this includes shopping, gardening, party planning, taking cruises, and enjoying spending time in her pool  NEXT MD VISIT: none reported today  OBJECTIVE:  Note: Objective  measures were completed at Evaluation unless otherwise noted.  PATIENT SURVEYS:  LEFS 30/80  COGNITION: Overall cognitive status: Within functional limits for tasks assessed     SENSATION: WFL  EDEMA:  No significant difference R vs L figure 8  POSTURE/GAIT: pt amb with decreased WB on L in stance phase  PALPATION: Pt reports tingling/parasthesias 1-2 toe  LOWER EXTREMITY ROM:  Active ROM Right eval Left eval  Hip flexion    Hip extension    Hip abduction    Hip adduction    Hip internal rotation    Hip external rotation    Knee flexion    Knee extension    Ankle dorsiflexion 10 12  Ankle plantarflexion 30 25  Ankle inversion    Ankle eversion     (Blank rows = not tested) Great toe: L 30 DF, 0 PF, R 70 DF and 30 PF  LOWER EXTREMITY MMT:  MMT Right eval Left eval  Hip flexion    Hip extension    Hip abduction    Hip adduction    Hip internal rotation    Hip external rotation    Knee flexion    Knee extension    Ankle dorsiflexion 5 4  Ankle plantarflexion 4 2  Ankle inversion 5 4  Ankle eversion 5 4   (Blank rows = not tested)    FUNCTIONAL TESTS:  SLS: L 2 sec, R x 10 sec Mini squat: able to perform Not able to perform heel raise in standing on L                                                                                                                               TREATMENT DATE: 05/29/24 Subjective:  Pt states she had surgery for removing the plate on May 05, 2024.  Left the screw.  She has had the stitches removed.  She feels like she is doing well since surgery.  Feels like her balance is a little better and the pain is a little better, standing walking endurance have improved. Stairs are doing better.  C/o L big toe isn't moving well, toes still curling under, difficulty walking without shoes on,   She will be gone for a week for a cruise leaving Sunday.    Jul 14, 2024 has bunion surgery scheduled on the other foot.      Objective:   Updated goals/goal reassessment 03/27/24 Gait: pt able to amb up/down stairs with reciprocal pattern now, improved ability to descend off L LE , nearly symmetrical Ankle AROM: DF 15 deg L, gr toe ext 10 deg Ankle MMT: PF 2+/5, DF: 5/5, inv 4+/5, eversion 4/5 SLS: L 10 seconds with  shoes on and no UE support now (improved) LEFS: was 30 at initial eval; 56 today   Therapeutic Exercises: Nustep seat #8, level 4 x 10 minutes, no use of UE today focused on LE only-  Standing calf stretch: 30 sec x 3- standing today with L foot behind, R in front Standing heel raises: 2x12, b/l, up 2/down L single x10  Standing squats to mat table 2x10, 2x10 with 8# weight front squat- not today Standing lateral step down for LE strengthening: 2x15 L on 6 inch height- not today MRE for L ankle eversion with PT in long sitting- to improve strength/endurance of fibularis mm, 3 sets to fatigue (12 reps)- not today    Applied KT tape to facilitate L fibularis longus mm- neuro muscular re-ed: SLS- 5-610 sec interval- standing on towel barefoot today SLS on airex barefoot with tactile cues for fibularis m L: 5-10 second intervals x 10 Tandem walk forward walk in hallway- 4x 25 ft Lateral stepping on airex beam 4 laps- not today Carioca stepping R and L in hallway (20 ft), 4 laps ea, crossing foot over front and also behind Reverse walking 3 laps in hallway 25 ft  Therapeutic Activities: Stairs: up/down 8 laps- reciprocal pattern and no railing needed Step up/reverse step down onto bosu x LRRL, step up and over to R and to L x 15 ea, step up and over forward x 15 LRR Forward alternating marches to SLS with head turns R/L 4x20 ft Squat+lift 5 lb med ball from 6 inch step x10, then from floor x10 Star drill- mini lunges- forward, diagonal, lateral, reverse- lead with L and lead with R 10 rounds ea  Pt education for how to continue with HEP, shoe type/shoewear for her foot while on cruise to  support foot  PATIENT EDUCATION:  Education details: PT POC/goals, gait mechanics on stairs, gait mechanics on flat surface Person educated: Patient Education method: Explanation, Demonstration, and Handouts Education comprehension: verbalized understanding  HOME EXERCISE PROGRAM: Access Code: [email or web address] URL: [email or web address] Date: 03/10/2024 Prepared by: Vernell Reges  Exercises - Standing Gastroc Stretch  - 2 x daily - 7 x weekly - 3 sets - 20 hold - Long Sitting Ankle Pumps  - 1 x daily - 7 x weekly - 3 sets - 10 reps - Single Leg Stance  - 1 x daily - 7 x weekly - Tandem Walking  - 1 x daily - 7 x weekly - Backward Tandem Walking  - 1 x daily - 7 x weekly  ASSESSMENT:  CLINICAL IMPRESSION: Pt is performing her daily activities at home with improved balance, improved endurance, and overall improved activity tolerance.  She is not having difficulty on the stairs anymore.  She does still have difficulty with SLS.  Tolerated KT tape on fibularis well today; kept on through session as tactile feedback for mm activation during exercises.  Overall, she has gained ankle ROM and strength, is able to stand full WB on L LE for increased time, and her gait mechanics on stairs are improving.  She plans to continue working on her HEP until her follow up surgery on Aug 22nd.  At that time if she needs to return to PT to address impairments/activity limitations I will re-evaluate and update her POC.    OBJECTIVE IMPAIRMENTS: Abnormal gait, decreased activity tolerance, decreased balance, difficulty walking, decreased ROM, decreased strength, and pain.   ACTIVITY LIMITATIONS: standing, squatting, stairs, and locomotion level  PARTICIPATION LIMITATIONS: meal prep, cleaning, laundry, interpersonal relationship,  shopping, community activity, and yard work  PERSONAL FACTORS: Time since onset of injury/illness/exacerbation are also affecting patient's functional outcome.   REHAB  POTENTIAL: Excellent  CLINICAL DECISION MAKING: Stable/uncomplicated  EVALUATION COMPLEXITY: Low   GOALS: Goals reviewed with patient? Yes  SHORT TERM GOALS: Target date: 02/25/24 Initiate HEP for ankle/foot ROM and strengthening Baseline: completed at initial evaluation Goal status: Met    LONG TERM GOALS: Target date: 04/08/24  Improve LEFS by 20 points indicating pt able to perform her daily activities without being as significantly limited by L ankle/foot  Baseline: 30/80; 7/14: 56/80 Goal status: In progress  2.  Improve ankle PF strength >1 MMT grade to facilitate improved ability to stand for her party planning and ambulate on even/uneven surfaces x 1 hour at a time Baseline: 2/5 strength; 7/14: able to perform unilateral L heel raise through partial ROM now (2+/5); 7/24: same 2+/5 Goal status: In progress  3.  Improve L SLS to >10 seconds to facilitate improved balance/decreased fall risk while standing and amb on uneven surfaces and on cruise ship when she travels later this summer wearing normal shoes Baseline: 2 sec; 7/14: 5 seconds; 7/24: 10 seconds- with shoes on Goal status: met   PLAN:  PT FREQUENCY: 2x/week  PT DURATION: 6 weeks  PLANNED INTERVENTIONS: 97110-Therapeutic exercises, 97530- Therapeutic activity, 97112- Neuromuscular re-education, 97535- Self Care, and [id number]- Manual therapy  PLAN FOR NEXT SESSION: pt will continue with HEP independently for next few weeks while traveling and having family functions leading up to her surgery (Aug 22).  Recommend she return to PT after her next surgery to address impairments with walking, balance, ROM, and strength.   Vernell Reges, PT, DPT, OCS  Vernell FORBES Reges, PT 05/29/2024, 10:47 AM

## 2024-05-31 ENCOUNTER — Encounter: Admitting: Podiatry

## 2024-05-31 ENCOUNTER — Ambulatory Visit

## 2024-05-31 DIAGNOSIS — R29898 Other symptoms and signs involving the musculoskeletal system: Secondary | ICD-10-CM

## 2024-05-31 DIAGNOSIS — M25675 Stiffness of left foot, not elsewhere classified: Secondary | ICD-10-CM

## 2024-05-31 DIAGNOSIS — M25672 Stiffness of left ankle, not elsewhere classified: Secondary | ICD-10-CM

## 2024-05-31 NOTE — Therapy (Signed)
 OUTPATIENT PHYSICAL THERAPY LOWER EXTREMITY TREATMENT /Re-Certification through 07/09/24   Patient Name: Paula [name] MRN: [id number] DOB:28-Dec-1955, 68 y.o., female Today's Date: 05/31/2024  END OF SESSION:  PT End of Session - 05/31/24 1135     Visit Number 15    Number of Visits 25    Date for PT Re-Evaluation 07/09/24    Authorization Type 2x/week x 6 weeks (recert done at 05/29/24, PN needed at visit #24 for medicare)    PT Start Time 0955    PT Stop Time 1035    PT Time Calculation (min) 40 min    Activity Tolerance Patient tolerated treatment well    Behavior During Therapy [facility] for tasks assessed/performed            Past Medical History:  Diagnosis Date   Asthma    Environmental allergies    GERD (gastroesophageal reflux disease)    Hypertension    Migraine    Sinusitis    Past Surgical History:  Procedure Laterality Date   AUGMENTATION MAMMAPLASTY Bilateral 1987   HERNIA REPAIR  1962   skin cancer   08/11/2016   removal   TUBAL LIGATION  08/11/2016   Patient Active Problem List   Diagnosis Date Noted   Dermatochalasis of both upper eyelids 07/31/2021   Ptosis of both eyebrows 07/31/2021   Allergic rhinitis due to pollen 06/26/2020   Poison ivy dermatitis 05/26/2020   Facet syndrome, lumbar 05/26/2020   Hair loss 05/26/2020   Encounter for routine adult health examination with abnormal findings 09/25/2019   Routine cervical smear 09/25/2019   Screening for osteoporosis 09/25/2019   Essential hypertension 07/06/2019   Generalized anxiety disorder 07/06/2019   Elevated blood pressure reading in office without diagnosis of hypertension 04/06/2019   Generalized abdominal discomfort 03/23/2019   Screening for breast cancer 09/23/2018   Other fatigue 09/23/2018   Headache syndrome 09/23/2018   Vitamin D  deficiency 09/23/2018   Fever blister 09/23/2018   History of malignant melanoma of skin 09/23/2018   Acute upper respiratory infection 08/24/2018    Acute sinusitis 09/21/2017   Unspecified otitis externa, bilateral 09/21/2017   Gastro-esophageal reflux disease without esophagitis 09/21/2017   Insomnia, unspecified 09/21/2017   Unspecified ovarian cyst, right side 09/21/2017   Unspecified ovarian cyst, left side 09/21/2017   Nasal mucositis (ulcerative) 09/21/2017   Allergic rhinitis, unspecified 09/21/2017   Cerebrovascular disease, unspecified 09/21/2017   Herpesviral infection, unspecified 09/21/2017   Varicella without complication 09/21/2017   Dysuria 09/21/2017   Abnormal weight gain 09/21/2017    PCP: Dr. Liana  REFERRING PROVIDER: Dr. Silva  REFERRING DIAG:  M72.2 (ICD-10-CM) - Plantar fasciitis of left foot  M62.462 (ICD-10-CM) - Gastrocnemius equinus, left    THERAPY DIAG:  Stiffness of left ankle, not elsewhere classified  Ankle weakness  Stiffness of left foot, not elsewhere classified  Rationale for Evaluation and Treatment: Rehabilitation  ONSET DATE: March 2025  SUBJECTIVE:   SUBJECTIVE STATEMENT: Pt reports having L plantar faciitis; underwent surgery end of Feb or early March: plantar fascia release, gastroc lengthening, 5th toe alignment.  She was in a boot NWB x 4 weeks.  Started wearing shoes in April and bearing weight on foot.  She states she saw the doctor last week and is having numbness in her great toe/2nd toe region and will be having another surgery to remove the plate in her foot which is irritating a nerve.  C/o: difficulty going down stairs, difficulty with her balance, and difficulty with prolonged standing  and walking.  Hasn't ventured out to walk in her yard, has been mainly staying on concrete or flat surfaces.  PERTINENT HISTORY: Foot surgery  PAIN:  Are you having pain? Yes, parasthesias in L foot- 4/10  PRECAUTIONS: None  RED FLAGS: None   WEIGHT BEARING RESTRICTIONS: no  FALLS:  Has patient fallen in last 6 months? Yes. Number of falls 1, early on after  surgery but none recently  LIVING ENVIRONMENT: Lives with: lives with their family Lives in: House/apartment Stairs: flight of stairs to get upstairs to second floor of home; 4-5 to enter home with railing Has following equipment at home:   OCCUPATION: retired  PLOF: Independent  PATIENT GOALS: to be able to resume her normal activities again without being limited by L foot- this includes shopping, gardening, party planning, taking cruises, and enjoying spending time in her pool  NEXT MD VISIT: none reported today  OBJECTIVE:  Note: Objective measures were completed at Evaluation unless otherwise noted.  PATIENT SURVEYS:  LEFS 30/80  COGNITION: Overall cognitive status: Within functional limits for tasks assessed     SENSATION: WFL  EDEMA:  No significant difference R vs L figure 8  POSTURE/GAIT: pt amb with decreased WB on L in stance phase  PALPATION: Pt reports tingling/parasthesias 1-2 toe  LOWER EXTREMITY ROM:  Active ROM Right eval Left eval  Hip flexion    Hip extension    Hip abduction    Hip adduction    Hip internal rotation    Hip external rotation    Knee flexion    Knee extension    Ankle dorsiflexion 10 12  Ankle plantarflexion 30 25  Ankle inversion    Ankle eversion     (Blank rows = not tested) Great toe: L 30 DF, 0 PF, R 70 DF and 30 PF  LOWER EXTREMITY MMT:  MMT Right eval Left eval  Hip flexion    Hip extension    Hip abduction    Hip adduction    Hip internal rotation    Hip external rotation    Knee flexion    Knee extension    Ankle dorsiflexion 5 4  Ankle plantarflexion 4 2  Ankle inversion 5 4  Ankle eversion 5 4   (Blank rows = not tested)    FUNCTIONAL TESTS:  SLS: L 2 sec, R x 10 sec Mini squat: able to perform Not able to perform heel raise in standing on L                                                                                                                               TREATMENT DATE:  05/31/24 Subjective:  Pt states she tried the towel scrunches and then heel raises for her HEP.  Feels some post-exercise soreness, but no increase in pain or parasthesias in foot.  Wearing sneakers feels most comfortable on her foot presently.  She leaves for her [place] cruise  this weekend.  Would like to continue working on balance and strengthening while she goes on her trip.  Wearing toe separators at home feels helpful for stretching her toes that curl under.  Jul 14, 2024 has bunion surgery scheduled on the other foot.    Objective: Pain 0-1/10 L great toe  Updated goals/goal reassessment 05/29/24 Observation: pt's skin along dorsal first ray appears to be healing well along scar/incision site, no redness  Gait: pt able to amb up/down stairs with reciprocal pattern; decreased L toe off during amb on even surfaces  Ankle AROM: DF 15 deg L, gr toe ext 10 deg  Ankle MMT: PF 3/5 (Able to perform 4 SL heel raise today), DF: 5/5, inv 4+/5, eversion 4/5  SLS: L 5 seconds with shoes on and no UE support now   LEFS: 50 today; was 30 at initial eval; 56 on 04/06/24   Therapeutic Exercises: Towel scrunches: lift, spread, press, pull as PT verbal cues: x10, 2 sets L LE in sitting Standing calf stretch: 30 sec x 3- standing today with L foot behind, R in front Standing heel raises: 2 sets up 2/down L single x10  PROM L great toe flex/extension, toes 2-5 flex/extension, end range PF stretch: 1 min holds  Neuro-re education: SL stance on flat ground- used mirror for visual feedback; 10 second intervals x 10 SLS on airex- used mirror for visual feedback to keep great toe down 5-10 second intervals x 10  Therapeutic Activities: Front step up on L LE to SLS on step x 5 second holds- for L LE strength/balance; 2x10 Tandem stance x 1 min ea direction Updated HEP (added towel scrunches to HEP today, added progression to eccentric heel raises on L LE to HEP, added front step up to SLS on step to  HEP) Pt education for how to continue with HEP, shoe type/shoewear for her foot while at home and also as she prepares for cruise later this week, discussed how to perform HEP on step while traveling   PATIENT EDUCATION:  Education details: PT POC/goals, gait mechanics on stairs, gait mechanics on flat surface Person educated: Patient Education method: Explanation, Demonstration, and Handouts Education comprehension: verbalized understanding  HOME EXERCISE PROGRAM: Access Code: [email or web address] URL: [email or web address] Date: 03/10/2024 Prepared by: Vernell Reges  Exercises - Standing Gastroc Stretch  - 2 x daily - 7 x weekly - 3 sets - 20 hold - Long Sitting Ankle Pumps  - 1 x daily - 7 x weekly - 3 sets - 10 reps - Single Leg Stance  - 1 x daily - 7 x weekly - Tandem Walking  - 1 x daily - 7 x weekly - Backward Tandem Walking  - 1 x daily - 7 x weekly  ASSESSMENT:  CLINICAL IMPRESSION: Pt arrived motivated to participate in PT today.  She was challenged with SL functional strength and balance exercises today on the step.  SL proprioception remains impaired, tends to lose balance laterally to L during SLS; used mirror for visual feedback for ankle/foot posture during session.  Overall, she should benefit from PT to improve her overall QOL, improve safety during amb, and improve her strength, balance, and walking tolerance.  She will be traveling next week out of state on a cruise; will f/u when she returns to [facility].  OBJECTIVE IMPAIRMENTS: Abnormal gait, decreased activity tolerance, decreased balance, difficulty walking, decreased ROM, decreased strength, and pain.   ACTIVITY LIMITATIONS: standing, squatting, stairs, and locomotion level  PARTICIPATION LIMITATIONS: meal prep,  cleaning, laundry, interpersonal relationship, shopping, community activity, and yard work  PERSONAL FACTORS: Time since onset of injury/illness/exacerbation are also affecting patient's functional  outcome.   REHAB POTENTIAL: Excellent  CLINICAL DECISION MAKING: Stable/uncomplicated  EVALUATION COMPLEXITY: Low   GOALS: Goals reviewed with patient? Yes  SHORT TERM GOALS: Target date: 06/14/24 Pt will demonstrate ability to perform HEP for ankle/foot ROM and strengthening and proprioception with proper technique and <3 PT tactile cues on form Baseline: updated today Goal status: in progress   LONG TERM GOALS: Target date: 07/09/24  Improve LEFS by 20 points indicating pt able to perform her daily activities without being as significantly limited by L ankle/foot  Baseline: 30/80; 7/14: 56/80; 05/29/24 50/80 Goal status: In progress  2.  Improve ankle PF strength >1 MMT grade to facilitate improved ability to stand for her party planning and ambulate on even/uneven surfaces x 1 hour at a time Baseline: 2/5 strength; 7/14: able to perform unilateral L heel raise through partial ROM now (2+/5); 7/24: same 2+/5; 05/29/24 3/5 Goal status: In progress  3.  Improve L SLS to >10 seconds to facilitate improved balance/decreased fall risk while standing and amb on uneven surfaces and on cruise ship when she travels this Fall wearing normal shoes Baseline: 2 sec; 7/14: 5 seconds; 7/24: 10, 05/29/24: 5 seconds- with shoes on Goal status: in progress   PLAN:  PT FREQUENCY: 2x/week  PT DURATION: 6 weeks  PLANNED INTERVENTIONS: 97110-Therapeutic exercises, 97530- Therapeutic activity, 97112- Neuromuscular re-education, 97535- Self Care, and 02859- Manual therapy  PLAN FOR NEXT SESSION: SL balance exercises, toe stretches, intrinsic foot mm strengthening, strengthening for L ankle  Vernell Reges, PT, DPT, OCS  Vernell FORBES Reges, PT 05/31/2024, 11:35 AM

## 2024-06-01 ENCOUNTER — Telehealth: Payer: Self-pay | Admitting: Podiatry

## 2024-06-01 NOTE — Telephone Encounter (Signed)
 Pt left message stating she has surgery scheduled 10/31 and needs to talk about post op schedule.  I returned call and left message for pt to call me back.

## 2024-06-06 ENCOUNTER — Telehealth: Payer: Self-pay | Admitting: Podiatry

## 2024-06-06 NOTE — Telephone Encounter (Signed)
 DOS- 07/14/2024  LAPIDUS PROCEDURE INCLUDING BUNIONECTOMY RT- [id number] AIKEN OSTEOTOMY RT- 71689  [facility] EFFECTIVE DATE- 09/14/2021  DEDUCTIBLE- N/A OOP- $3249 REMAINING- $5426.81 COINSURANCE- 0%  PER [place] WEBSITE, PRIOR AUTH FOR CPT CODES 71702 AND [phone] HAVE BEEN APPROVED FROM 07/14/2024-10/14/2024. AUTH# [id number]

## 2024-06-12 ENCOUNTER — Encounter: Admitting: Podiatry

## 2024-06-13 ENCOUNTER — Encounter: Admitting: Podiatry

## 2024-06-16 ENCOUNTER — Telehealth: Payer: Self-pay

## 2024-06-16 ENCOUNTER — Ambulatory Visit: Attending: Podiatry

## 2024-06-16 DIAGNOSIS — M722 Plantar fascial fibromatosis: Secondary | ICD-10-CM | POA: Diagnosis not present

## 2024-06-16 DIAGNOSIS — R29898 Other symptoms and signs involving the musculoskeletal system: Secondary | ICD-10-CM | POA: Diagnosis not present

## 2024-06-16 DIAGNOSIS — M25672 Stiffness of left ankle, not elsewhere classified: Secondary | ICD-10-CM | POA: Insufficient documentation

## 2024-06-16 DIAGNOSIS — M25675 Stiffness of left foot, not elsewhere classified: Secondary | ICD-10-CM | POA: Insufficient documentation

## 2024-06-16 NOTE — Therapy (Addendum)
 OUTPATIENT PHYSICAL THERAPY LOWER EXTREMITY TREATMENT /Re-Certification through 07/09/24   Patient Name: Paula [name] MRN: [id number] DOB:1956-03-18, 68 y.o., female Today's Date: 06/16/2024  END OF SESSION:  PT End of Session - 06/16/24 0955     Visit Number 16    Number of Visits 25    Date for Recertification  07/09/24    Authorization Type 2x/week x 6 weeks (recert done at 05/29/24, PN needed at visit #24 for medicare)    PT Start Time 0945    PT Stop Time 1030    PT Time Calculation (min) 45 min    Activity Tolerance Patient tolerated treatment well    Behavior During Therapy [facility] for tasks assessed/performed            Past Medical History:  Diagnosis Date   Asthma    Environmental allergies    GERD (gastroesophageal reflux disease)    Hypertension    Migraine    Sinusitis    Past Surgical History:  Procedure Laterality Date   AUGMENTATION MAMMAPLASTY Bilateral 1987   HERNIA REPAIR  1962   skin cancer   08/11/2016   removal   TUBAL LIGATION  08/11/2016   Patient Active Problem List   Diagnosis Date Noted   Dermatochalasis of both upper eyelids 07/31/2021   Ptosis of both eyebrows 07/31/2021   Allergic rhinitis due to pollen 06/26/2020   Poison ivy dermatitis 05/26/2020   Facet syndrome, lumbar 05/26/2020   Hair loss 05/26/2020   Encounter for routine adult health examination with abnormal findings 09/25/2019   Routine cervical smear 09/25/2019   Screening for osteoporosis 09/25/2019   Essential hypertension 07/06/2019   Generalized anxiety disorder 07/06/2019   Elevated blood pressure reading in office without diagnosis of hypertension 04/06/2019   Generalized abdominal discomfort 03/23/2019   Screening for breast cancer 09/23/2018   Other fatigue 09/23/2018   Headache syndrome 09/23/2018   Vitamin D  deficiency 09/23/2018   Fever blister 09/23/2018   History of malignant melanoma of skin 09/23/2018   Acute upper respiratory infection 08/24/2018    Acute sinusitis 09/21/2017   Unspecified otitis externa, bilateral 09/21/2017   Gastro-esophageal reflux disease without esophagitis 09/21/2017   Insomnia, unspecified 09/21/2017   Unspecified ovarian cyst, right side 09/21/2017   Unspecified ovarian cyst, left side 09/21/2017   Nasal mucositis (ulcerative) 09/21/2017   Allergic rhinitis, unspecified 09/21/2017   Cerebrovascular disease, unspecified 09/21/2017   Herpesviral infection, unspecified 09/21/2017   Varicella without complication 09/21/2017   Dysuria 09/21/2017   Abnormal weight gain 09/21/2017    PCP: Dr. Liana  REFERRING PROVIDER: Dr. Silva  REFERRING DIAG:  M72.2 (ICD-10-CM) - Plantar fasciitis of left foot  M62.462 (ICD-10-CM) - Gastrocnemius equinus, left    THERAPY DIAG:  Stiffness of left ankle, not elsewhere classified  Ankle weakness  Stiffness of left foot, not elsewhere classified  Plantar fasciitis  Rationale for Evaluation and Treatment: Rehabilitation  ONSET DATE: March 2025  SUBJECTIVE:   SUBJECTIVE STATEMENT: Pt reports having L plantar faciitis; underwent surgery end of Feb or early March: plantar fascia release, gastroc lengthening, 5th toe alignment.  She was in a boot NWB x 4 weeks.  Started wearing shoes in April and bearing weight on foot.  She states she saw the doctor last week and is having numbness in her great toe/2nd toe region and will be having another surgery to remove the plate in her foot which is irritating a nerve.  C/o: difficulty going down stairs, difficulty with her balance, and difficulty  with prolonged standing and walking.  Hasn't ventured out to walk in her yard, has been mainly staying on concrete or flat surfaces.  PERTINENT HISTORY: Foot surgery  PAIN:  Are you having pain? Yes, parasthesias in L foot- 4/10  PRECAUTIONS: None  RED FLAGS: None   WEIGHT BEARING RESTRICTIONS: no  FALLS:  Has patient fallen in last 6 months? Yes. Number of falls 1,  early on after surgery but none recently  LIVING ENVIRONMENT: Lives with: lives with their family Lives in: House/apartment Stairs: flight of stairs to get upstairs to second floor of home; 4-5 to enter home with railing Has following equipment at home:   OCCUPATION: retired  PLOF: Independent  PATIENT GOALS: to be able to resume her normal activities again without being limited by L foot- this includes shopping, gardening, party planning, taking cruises, and enjoying spending time in her pool  NEXT MD VISIT: none reported today  OBJECTIVE:  Note: Objective measures were completed at Evaluation unless otherwise noted.  PATIENT SURVEYS:  LEFS 30/80  COGNITION: Overall cognitive status: Within functional limits for tasks assessed     SENSATION: WFL  EDEMA:  No significant difference R vs L figure 8  POSTURE/GAIT: pt amb with decreased WB on L in stance phase  PALPATION: Pt reports tingling/parasthesias 1-2 toe  LOWER EXTREMITY ROM:  Active ROM Right eval Left eval  Hip flexion    Hip extension    Hip abduction    Hip adduction    Hip internal rotation    Hip external rotation    Knee flexion    Knee extension    Ankle dorsiflexion 10 12  Ankle plantarflexion 30 25  Ankle inversion    Ankle eversion     (Blank rows = not tested) Great toe: L 30 DF, 0 PF, R 70 DF and 30 PF  LOWER EXTREMITY MMT:  MMT Right eval Left eval  Hip flexion    Hip extension    Hip abduction    Hip adduction    Hip internal rotation    Hip external rotation    Knee flexion    Knee extension    Ankle dorsiflexion 5 4  Ankle plantarflexion 4 2  Ankle inversion 5 4  Ankle eversion 5 4   (Blank rows = not tested)    FUNCTIONAL TESTS:  SLS: L 2 sec, R x 10 sec Mini squat: able to perform Not able to perform heel raise in standing on L                                                                                                                               TREATMENT  DATE: 06/16/24 Subjective:  Pt states she had no falls while traveling on her cruise to [place] .  Got back Monday.  She did a little of her HEP.  Cannot wear heels- felt too weak in ankles.  No c/o pain upon arrival. Would like  to keep working on improving her strength and walking tolerance.    Jul 14, 2024 has bunion surgery scheduled on the other foot.    Objective: Pain 0-1/10 L great toe  Updated goals/goal reassessment 05/29/24 Observation: pt's skin along dorsal first ray appears to be healing well along scar/incision site, no redness  Gait: pt able to amb up/down stairs with reciprocal pattern; decreased L toe off during amb on even surfaces  Ankle AROM: DF 15 deg L, gr toe ext 10 deg  Ankle MMT: PF 3/5 (Able to perform 4 SL heel raise today), DF: 5/5, inv 4+/5, eversion 4/5  SLS: L 5 seconds with shoes on and no UE support now   LEFS: 50 today; was 30 at initial eval; 56 on 04/06/24   Therapeutic Exercises: Towel scrunches: lift, spread, press, pull as PT verbal cues: x10, 2 sets L LE in sitting- DC Lift toes, spread, then press down into towel: x10, 2 sets Standing calf stretch: 30 sec x 3- standing today with L foot behind, R in front Standing heel raises: 2 sets up 2/down L single x10   Nustep Level 4-5 x 10 min LE strength PROM L great toe flex/extension, toes 2-5 flex/extension, end range PF stretch: 1 min holds  Neuro-re education: SL stance on flat ground- used mirror for visual feedback; 10 second intervals x 10 SLS on airex- used mirror for visual feedback to keep great toe down 5-10 second intervals x 10 SLS with ball toss to/from PT 2-3 rep intervals x 5  Therapeutic Activities: Front step up on L LE to SLS on step x 5 second holds- for L LE strength/balance; 2x10 Tandem stance x 1 min ea direction Wall squats with physioball behind: x10, 2 sets with 3-5 sec holds Standing heel raises with toes out x 15  Manual tx: 1st ray mob x 15, manual great toe ext x 2  min and 1 MTP P/A mob x 30 seconds gr IV, 3 rounds PATIENT EDUCATION:  Education details: PT POC/goals, gait mechanics on stairs, gait mechanics on flat surface Person educated: Patient Education method: Explanation, Demonstration, and Handouts Education comprehension: verbalized understanding  HOME EXERCISE PROGRAM: Access Code: [email or web address] URL: [email or web address] Date: 03/10/2024 Prepared by: Vernell Reges  Exercises - Standing Gastroc Stretch  - 2 x daily - 7 x weekly - 3 sets - 20 hold - Long Sitting Ankle Pumps  - 1 x daily - 7 x weekly - 3 sets - 10 reps - Single Leg Stance  - 1 x daily - 7 x weekly - Tandem Walking  - 1 x daily - 7 x weekly - Backward Tandem Walking  - 1 x daily - 7 x weekly  ASSESSMENT:  CLINICAL IMPRESSION: Pt arrived motivated to participate in PT today.  She was challenged with SL functional strength and balance exercises today on the step.  SL proprioception remains impaired, it is improving since last session though.  Progressed foot strengthening exercises today and pt reports no increase in pain at end of session.  Overall, she should benefit from PT to improve her overall QOL, improve safety during amb, and improve her strength, balance, and walking tolerance.  She will be traveling next week out of state on a cruise; will f/u when she returns to [facility].  OBJECTIVE IMPAIRMENTS: Abnormal gait, decreased activity tolerance, decreased balance, difficulty walking, decreased ROM, decreased strength, and pain.   ACTIVITY LIMITATIONS: standing, squatting, stairs, and locomotion level  PARTICIPATION LIMITATIONS: meal prep, cleaning, laundry, interpersonal  relationship, shopping, community activity, and yard work  PERSONAL FACTORS: Time since onset of injury/illness/exacerbation are also affecting patient's functional outcome.   REHAB POTENTIAL: Excellent  CLINICAL DECISION MAKING: Stable/uncomplicated  EVALUATION COMPLEXITY:  Low   GOALS: Goals reviewed with patient? Yes  SHORT TERM GOALS: Target date: 06/14/24 Pt will demonstrate ability to perform HEP for ankle/foot ROM and strengthening and proprioception with proper technique and <3 PT tactile cues on form Baseline: updated today Goal status: in progress   LONG TERM GOALS: Target date: 07/09/24  Improve LEFS by 20 points indicating pt able to perform her daily activities without being as significantly limited by L ankle/foot  Baseline: 30/80; 7/14: 56/80; 05/29/24 50/80 Goal status: In progress  2.  Improve ankle PF strength >1 MMT grade to facilitate improved ability to stand for her party planning and ambulate on even/uneven surfaces x 1 hour at a time Baseline: 2/5 strength; 7/14: able to perform unilateral L heel raise through partial ROM now (2+/5); 7/24: same 2+/5; 05/29/24 3/5 Goal status: In progress  3.  Improve L SLS to >10 seconds to facilitate improved balance/decreased fall risk while standing and amb on uneven surfaces and on cruise ship when she travels this Fall wearing normal shoes Baseline: 2 sec; 7/14: 5 seconds; 7/24: 10, 05/29/24: 5 seconds- with shoes on Goal status: in progress   PLAN:  PT FREQUENCY: 2x/week  PT DURATION: 6 weeks  PLANNED INTERVENTIONS: 97110-Therapeutic exercises, 97530- Therapeutic activity, 97112- Neuromuscular re-education, 97535- Self Care, and 02859- Manual therapy  PLAN FOR NEXT SESSION: SL balance exercises, toe stretches, intrinsic foot mm strengthening, strengthening for L ankle  Vernell Reges, PT, DPT, OCS  Vernell FORBES Reges, PT 06/16/2024, 9:56 AM

## 2024-06-17 MED ORDER — CELECOXIB 100 MG PO CAPS: 100.0000 mg | ORAL_CAPSULE | Freq: Two times a day (BID) | ORAL | 2 refills | Status: AC

## 2024-06-19 NOTE — Telephone Encounter (Signed)
 Lmom that we sent med

## 2024-06-21 ENCOUNTER — Encounter: Admitting: Podiatry

## 2024-06-23 ENCOUNTER — Ambulatory Visit

## 2024-06-26 ENCOUNTER — Ambulatory Visit

## 2024-06-26 DIAGNOSIS — M25675 Stiffness of left foot, not elsewhere classified: Secondary | ICD-10-CM

## 2024-06-26 DIAGNOSIS — R29898 Other symptoms and signs involving the musculoskeletal system: Secondary | ICD-10-CM

## 2024-06-26 DIAGNOSIS — M722 Plantar fascial fibromatosis: Secondary | ICD-10-CM | POA: Diagnosis not present

## 2024-06-26 DIAGNOSIS — M25672 Stiffness of left ankle, not elsewhere classified: Secondary | ICD-10-CM

## 2024-06-26 NOTE — Therapy (Signed)
 OUTPATIENT PHYSICAL THERAPY LOWER EXTREMITY TREATMENT /Re-Certification through 07/09/24   Patient Name: Paula [name] MRN: [id number] DOB:05/12/1956, 68 y.o., female Today's Date: 06/26/2024  END OF SESSION:  PT End of Session - 06/26/24 1331     Visit Number 17    Number of Visits 25    Date for Recertification  07/09/24    Authorization Type 2x/week x 6 weeks (recert done at 05/29/24, PN needed at visit #24 for medicare)    PT Start Time 1330    PT Stop Time 1415    PT Time Calculation (min) 45 min    Activity Tolerance Patient tolerated treatment well    Behavior During Therapy [facility] for tasks assessed/performed            Past Medical History:  Diagnosis Date   Asthma    Environmental allergies    GERD (gastroesophageal reflux disease)    Hypertension    Migraine    Sinusitis    Past Surgical History:  Procedure Laterality Date   AUGMENTATION MAMMAPLASTY Bilateral 1987   HERNIA REPAIR  1962   skin cancer   08/11/2016   removal   TUBAL LIGATION  08/11/2016   Patient Active Problem List   Diagnosis Date Noted   Dermatochalasis of both upper eyelids 07/31/2021   Ptosis of both eyebrows 07/31/2021   Allergic rhinitis due to pollen 06/26/2020   Poison ivy dermatitis 05/26/2020   Facet syndrome, lumbar 05/26/2020   Hair loss 05/26/2020   Encounter for routine adult health examination with abnormal findings 09/25/2019   Routine cervical smear 09/25/2019   Screening for osteoporosis 09/25/2019   Essential hypertension 07/06/2019   Generalized anxiety disorder 07/06/2019   Elevated blood pressure reading in office without diagnosis of hypertension 04/06/2019   Generalized abdominal discomfort 03/23/2019   Screening for breast cancer 09/23/2018   Other fatigue 09/23/2018   Headache syndrome 09/23/2018   Vitamin D  deficiency 09/23/2018   Fever blister 09/23/2018   History of malignant melanoma of skin 09/23/2018   Acute upper respiratory infection 08/24/2018    Acute sinusitis 09/21/2017   Unspecified otitis externa, bilateral 09/21/2017   Gastro-esophageal reflux disease without esophagitis 09/21/2017   Insomnia, unspecified 09/21/2017   Unspecified ovarian cyst, right side 09/21/2017   Unspecified ovarian cyst, left side 09/21/2017   Nasal mucositis (ulcerative) 09/21/2017   Allergic rhinitis, unspecified 09/21/2017   Cerebrovascular disease, unspecified 09/21/2017   Herpesviral infection, unspecified 09/21/2017   Varicella without complication 09/21/2017   Dysuria 09/21/2017   Abnormal weight gain 09/21/2017    PCP: Dr. Liana  REFERRING PROVIDER: Dr. Silva  REFERRING DIAG:  M72.2 (ICD-10-CM) - Plantar fasciitis of left foot  M62.462 (ICD-10-CM) - Gastrocnemius equinus, left    THERAPY DIAG:  Stiffness of left ankle, not elsewhere classified  Ankle weakness  Stiffness of left foot, not elsewhere classified  Plantar fasciitis  Rationale for Evaluation and Treatment: Rehabilitation  ONSET DATE: March 2025  SUBJECTIVE:   SUBJECTIVE STATEMENT: Pt reports having L plantar faciitis; underwent surgery end of Feb or early March: plantar fascia release, gastroc lengthening, 5th toe alignment.  She was in a boot NWB x 4 weeks.  Started wearing shoes in April and bearing weight on foot.  She states she saw the doctor last week and is having numbness in her great toe/2nd toe region and will be having another surgery to remove the plate in her foot which is irritating a nerve.  C/o: difficulty going down stairs, difficulty with her balance, and difficulty  with prolonged standing and walking.  Hasn't ventured out to walk in her yard, has been mainly staying on concrete or flat surfaces.  PERTINENT HISTORY: Foot surgery  PAIN:  Are you having pain? Yes, parasthesias in L foot- 4/10  PRECAUTIONS: None  RED FLAGS: None   WEIGHT BEARING RESTRICTIONS: no  FALLS:  Has patient fallen in last 6 months? Yes. Number of falls 1,  early on after surgery but none recently  LIVING ENVIRONMENT: Lives with: lives with their family Lives in: House/apartment Stairs: flight of stairs to get upstairs to second floor of home; 4-5 to enter home with railing Has following equipment at home:   OCCUPATION: retired  PLOF: Independent  PATIENT GOALS: to be able to resume her normal activities again without being limited by L foot- this includes shopping, gardening, party planning, taking cruises, and enjoying spending time in her pool  NEXT MD VISIT: none reported today  OBJECTIVE:  Note: Objective measures were completed at Evaluation unless otherwise noted.  PATIENT SURVEYS:  LEFS 30/80  COGNITION: Overall cognitive status: Within functional limits for tasks assessed     SENSATION: WFL  EDEMA:  No significant difference R vs L figure 8  POSTURE/GAIT: pt amb with decreased WB on L in stance phase  PALPATION: Pt reports tingling/parasthesias 1-2 toe  LOWER EXTREMITY ROM:  Active ROM Right eval Left eval  Hip flexion    Hip extension    Hip abduction    Hip adduction    Hip internal rotation    Hip external rotation    Knee flexion    Knee extension    Ankle dorsiflexion 10 12  Ankle plantarflexion 30 25  Ankle inversion    Ankle eversion     (Blank rows = not tested) Great toe: L 30 DF, 0 PF, R 70 DF and 30 PF  LOWER EXTREMITY MMT:  MMT Right eval Left eval  Hip flexion    Hip extension    Hip abduction    Hip adduction    Hip internal rotation    Hip external rotation    Knee flexion    Knee extension    Ankle dorsiflexion 5 4  Ankle plantarflexion 4 2  Ankle inversion 5 4  Ankle eversion 5 4   (Blank rows = not tested)    FUNCTIONAL TESTS:  SLS: L 2 sec, R x 10 sec Mini squat: able to perform Not able to perform heel raise in standing on L                                                                                                                               TREATMENT  DATE: 06/26/24 Subjective:  Pt states she overall feels like her foot is doing better.  Less pain overall.  She is feeling stronger and like her balance is getting better.  Would like to keep working on improving her strength and walking tolerance.  She  is having a upper respiratory illness and finishing a course of antibiotics.    Jul 14, 2024 has bunion surgery scheduled on the other foot.    Objective: Pain 0-1/10 L great toe  Updated goals/goal reassessment 05/29/24 Observation: pt's skin along dorsal first ray appears to be healing well along scar/incision site, no redness  Gait: pt able to amb up/down stairs with reciprocal pattern; decreased L toe off during amb on even surfaces  Ankle AROM: DF 15 deg L, gr toe ext 10 deg  Ankle MMT: PF 3/5 (Able to perform 4 SL heel raise today), DF: 5/5, inv 4+/5, eversion 4/5  SLS: L 5 seconds with shoes on and no UE support now   LEFS: 50 today; was 30 at initial eval; 56 on 04/06/24   Therapeutic Exercises: Towel scrunches: lift, spread, press, pull as PT verbal cues: x10, 2 sets L LE in sitting- DC Lift toes, spread, then press down into towel: x10, 2 sets Standing calf stretch: 30 sec x 3- standing today with L foot behind, R in front Standing heel raises: 2 sets up 2/down L single x10   Nustep Level 3-4 x 10 min LE strength PROM L great toe flex/extension, toes 2-5 flex/extension, end range PF stretch: 1 min holds  Neuro-re education: SL stance on flat ground- used mirror for visual feedback; 10 second intervals x 10 SLS on airex- used mirror for visual feedback to keep great toe down 5-10 second intervals x 10 SLS with ball toss to/from PT 2-3 rep intervals x 5  Therapeutic Activities: Front step up on L LE to SLS on step x 5 second holds- for L LE strength/balance; 2x10 Tandem stance x 1 min ea direction Wall squats with physioball behind: x10, 2 sets with 3-5 sec holds Standing heel raises with toes out x 15  Manual tx: 1st  ray mob x 15, manual great toe ext x 2 min and 1 MTP P/A mob x 30 seconds gr IV, 3 rounds  PATIENT EDUCATION:  Education details: PT POC/goals, gait mechanics on stairs, gait mechanics on flat surface Person educated: Patient Education method: Explanation, Demonstration, and Handouts Education comprehension: verbalized understanding  HOME EXERCISE PROGRAM: Access Code: [email or web address] URL: [email or web address] Date: 03/10/2024 Prepared by: Vernell Reges  Exercises - Standing Gastroc Stretch  - 2 x daily - 7 x weekly - 3 sets - 20 hold - Long Sitting Ankle Pumps  - 1 x daily - 7 x weekly - 3 sets - 10 reps - Single Leg Stance  - 1 x daily - 7 x weekly - Tandem Walking  - 1 x daily - 7 x weekly - Backward Tandem Walking  - 1 x daily - 7 x weekly  ASSESSMENT:  CLINICAL IMPRESSION: Pt arrived motivated to participate in PT today.  She was challenged with SL functional strength and balance exercises today on the step.  SL proprioception remains impaired, it is improving since last session though.  Progressed foot strengthening exercises today and pt reports no increase in pain at end of session.  Overall, she should benefit from PT to improve her overall QOL, improve safety during amb, and improve her strength, balance, and walking tolerance.  She will be traveling next week out of state on a cruise; will f/u when she returns to [facility].  OBJECTIVE IMPAIRMENTS: Abnormal gait, decreased activity tolerance, decreased balance, difficulty walking, decreased ROM, decreased strength, and pain.   ACTIVITY LIMITATIONS: standing, squatting, stairs, and locomotion level  PARTICIPATION LIMITATIONS: meal prep,  cleaning, laundry, interpersonal relationship, shopping, community activity, and yard work  PERSONAL FACTORS: Time since onset of injury/illness/exacerbation are also affecting patient's functional outcome.   REHAB POTENTIAL: Excellent  CLINICAL DECISION MAKING:  Stable/uncomplicated  EVALUATION COMPLEXITY: Low   GOALS: Goals reviewed with patient? Yes  SHORT TERM GOALS: Target date: 06/14/24 Pt will demonstrate ability to perform HEP for ankle/foot ROM and strengthening and proprioception with proper technique and <3 PT tactile cues on form Baseline: updated today Goal status: in progress   LONG TERM GOALS: Target date: 07/09/24  Improve LEFS by 20 points indicating pt able to perform her daily activities without being as significantly limited by L ankle/foot  Baseline: 30/80; 7/14: 56/80; 05/29/24 50/80 Goal status: In progress  2.  Improve ankle PF strength >1 MMT grade to facilitate improved ability to stand for her party planning and ambulate on even/uneven surfaces x 1 hour at a time Baseline: 2/5 strength; 7/14: able to perform unilateral L heel raise through partial ROM now (2+/5); 7/24: same 2+/5; 05/29/24 3/5 Goal status: In progress  3.  Improve L SLS to >10 seconds to facilitate improved balance/decreased fall risk while standing and amb on uneven surfaces and on cruise ship when she travels this Fall wearing normal shoes Baseline: 2 sec; 7/14: 5 seconds; 7/24: 10, 05/29/24: 5 seconds- with shoes on Goal status: in progress   PLAN:  PT FREQUENCY: 2x/week  PT DURATION: 6 weeks  PLANNED INTERVENTIONS: 97110-Therapeutic exercises, 97530- Therapeutic activity, 97112- Neuromuscular re-education, 97535- Self Care, and [id number]- Manual therapy  PLAN FOR NEXT SESSION: SL balance exercises, toe stretches, intrinsic foot mm strengthening, strengthening for L ankle  Vernell Reges, PT, DPT, OCS  Vernell FORBES Reges, PT 06/26/2024, 1:32 PM

## 2024-06-28 ENCOUNTER — Telehealth: Payer: Self-pay

## 2024-06-28 ENCOUNTER — Ambulatory Visit

## 2024-06-28 DIAGNOSIS — M25675 Stiffness of left foot, not elsewhere classified: Secondary | ICD-10-CM | POA: Diagnosis not present

## 2024-06-28 DIAGNOSIS — R29898 Other symptoms and signs involving the musculoskeletal system: Secondary | ICD-10-CM | POA: Diagnosis not present

## 2024-06-28 DIAGNOSIS — M25672 Stiffness of left ankle, not elsewhere classified: Secondary | ICD-10-CM

## 2024-06-28 DIAGNOSIS — M722 Plantar fascial fibromatosis: Secondary | ICD-10-CM | POA: Diagnosis not present

## 2024-06-28 NOTE — Therapy (Signed)
 OUTPATIENT PHYSICAL THERAPY LOWER EXTREMITY TREATMENT /Re-Certification through 07/09/24   Patient Name: Paula [name] MRN: [id number] DOB:02/03/1956, 68 y.o., female Today's Date: 06/28/2024  END OF SESSION:  PT End of Session - 06/28/24 0901     Visit Number 18    Number of Visits 25    Date for Recertification  07/09/24    Authorization Type 2x/week x 6 weeks (recert done at 05/29/24, PN needed at visit #24 for medicare)    PT Start Time 0902    PT Stop Time 0945    PT Time Calculation (min) 43 min    Activity Tolerance Patient tolerated treatment well    Behavior During Therapy [facility] for tasks assessed/performed            Past Medical History:  Diagnosis Date   Asthma    Environmental allergies    GERD (gastroesophageal reflux disease)    Hypertension    Migraine    Sinusitis    Past Surgical History:  Procedure Laterality Date   AUGMENTATION MAMMAPLASTY Bilateral 1987   HERNIA REPAIR  1962   skin cancer   08/11/2016   removal   TUBAL LIGATION  08/11/2016   Patient Active Problem List   Diagnosis Date Noted   Dermatochalasis of both upper eyelids 07/31/2021   Ptosis of both eyebrows 07/31/2021   Allergic rhinitis due to pollen 06/26/2020   Poison ivy dermatitis 05/26/2020   Facet syndrome, lumbar 05/26/2020   Hair loss 05/26/2020   Encounter for routine adult health examination with abnormal findings 09/25/2019   Routine cervical smear 09/25/2019   Screening for osteoporosis 09/25/2019   Essential hypertension 07/06/2019   Generalized anxiety disorder 07/06/2019   Elevated blood pressure reading in office without diagnosis of hypertension 04/06/2019   Generalized abdominal discomfort 03/23/2019   Screening for breast cancer 09/23/2018   Other fatigue 09/23/2018   Headache syndrome 09/23/2018   Vitamin D  deficiency 09/23/2018   Fever blister 09/23/2018   History of malignant melanoma of skin 09/23/2018   Acute upper respiratory infection 08/24/2018    Acute sinusitis 09/21/2017   Unspecified otitis externa, bilateral 09/21/2017   Gastro-esophageal reflux disease without esophagitis 09/21/2017   Insomnia, unspecified 09/21/2017   Unspecified ovarian cyst, right side 09/21/2017   Unspecified ovarian cyst, left side 09/21/2017   Nasal mucositis (ulcerative) 09/21/2017   Allergic rhinitis, unspecified 09/21/2017   Cerebrovascular disease, unspecified 09/21/2017   Herpesviral infection, unspecified 09/21/2017   Varicella without complication 09/21/2017   Dysuria 09/21/2017   Abnormal weight gain 09/21/2017    PCP: Dr. Liana  REFERRING PROVIDER: Dr. Silva  REFERRING DIAG:  M72.2 (ICD-10-CM) - Plantar fasciitis of left foot  M62.462 (ICD-10-CM) - Gastrocnemius equinus, left    THERAPY DIAG:  Stiffness of left ankle, not elsewhere classified  Ankle weakness  Stiffness of left foot, not elsewhere classified  Plantar fasciitis  Rationale for Evaluation and Treatment: Rehabilitation  ONSET DATE: March 2025  SUBJECTIVE:   SUBJECTIVE STATEMENT: Pt reports having L plantar faciitis; underwent surgery end of Feb or early March: plantar fascia release, gastroc lengthening, 5th toe alignment.  She was in a boot NWB x 4 weeks.  Started wearing shoes in April and bearing weight on foot.  She states she saw the doctor last week and is having numbness in her great toe/2nd toe region and will be having another surgery to remove the plate in her foot which is irritating a nerve.  C/o: difficulty going down stairs, difficulty with her balance, and difficulty  with prolonged standing and walking.  Hasn't ventured out to walk in her yard, has been mainly staying on concrete or flat surfaces.  PERTINENT HISTORY: Foot surgery  PAIN:  Are you having pain? Yes, parasthesias in L foot- 4/10  PRECAUTIONS: None  RED FLAGS: None   WEIGHT BEARING RESTRICTIONS: no  FALLS:  Has patient fallen in last 6 months? Yes. Number of falls 1,  early on after surgery but none recently  LIVING ENVIRONMENT: Lives with: lives with their family Lives in: House/apartment Stairs: flight of stairs to get upstairs to second floor of home; 4-5 to enter home with railing Has following equipment at home:   OCCUPATION: retired  PLOF: Independent  PATIENT GOALS: to be able to resume her normal activities again without being limited by L foot- this includes shopping, gardening, party planning, taking cruises, and enjoying spending time in her pool  NEXT MD VISIT: none reported today  OBJECTIVE:  Note: Objective measures were completed at Evaluation unless otherwise noted.  PATIENT SURVEYS:  LEFS 30/80  COGNITION: Overall cognitive status: Within functional limits for tasks assessed     SENSATION: WFL  EDEMA:  No significant difference R vs L figure 8  POSTURE/GAIT: pt amb with decreased WB on L in stance phase  PALPATION: Pt reports tingling/parasthesias 1-2 toe  LOWER EXTREMITY ROM:  Active ROM Right eval Left eval  Hip flexion    Hip extension    Hip abduction    Hip adduction    Hip internal rotation    Hip external rotation    Knee flexion    Knee extension    Ankle dorsiflexion 10 12  Ankle plantarflexion 30 25  Ankle inversion    Ankle eversion     (Blank rows = not tested) Great toe: L 30 DF, 0 PF, R 70 DF and 30 PF  LOWER EXTREMITY MMT:  MMT Right eval Left eval  Hip flexion    Hip extension    Hip abduction    Hip adduction    Hip internal rotation    Hip external rotation    Knee flexion    Knee extension    Ankle dorsiflexion 5 4  Ankle plantarflexion 4 2  Ankle inversion 5 4  Ankle eversion 5 4   (Blank rows = not tested)    FUNCTIONAL TESTS:  SLS: L 2 sec, R x 10 sec Mini squat: able to perform Not able to perform heel raise in standing on L                                                                                                                               TREATMENT  DATE: 06/28/24 Subjective:  Pt states she overall her foot is feeling better.  She feels like her toes are moving more this week.  Leaving for a cruise on Monday.  Heels like her balance is getting better.  Still lacks strength in calf.  Tried new exercise from last time at home and it went well.    Jul 14, 2024 has bunion surgery scheduled on the other foot.    Objective: Pain 0-1/10 L great toe  Updated goals/goal reassessment 05/29/24 Observation: pt's skin along dorsal first ray appears to be healing well along scar/incision site, no redness  Gait: pt able to amb up/down stairs with reciprocal pattern; decreased L toe off during amb on even surfaces  Ankle AROM: DF 15 deg L, gr toe ext 10 deg  Ankle MMT: PF 3/5 (Able to perform 4 SL heel raise today), DF: 5/5, inv 4+/5, eversion 4/5  SLS: L 5 seconds with shoes on and no UE support now   LEFS: 50 today; was 30 at initial eval; 56 on 04/06/24   Therapeutic Exercises: Lift toes, spread, then press down into towel: x10, 2 sets Standing calf stretch: 30 sec x 3- standing today with L foot behind, R in front Standing heel raises: 2 sets up 2/down L single x10  PF with black TB x20, 2 sets with PT PF with soft ball x20- not today  Nustep Level 3-4 x 10 min LE strength PROM L great toe flex/extension, toes 2-5 flex/extension, end range PF stretch: 1 min holds  Therapeutic Activities: Front step up on L LE to SLS on step x 5 second holds- for L LE strength/balance; 2x10 Lateral step down 2x10 Stairss- 3 laps  Tandem stance x 1 min ea direction Wall squats with physioball behind: x10, 2 sets with 3-5 sec holds Standing heel raises with toes out x 15, 2 sets SLS x 4 Step ups onto bosu with L foot x 20- forward x20 lateral up and over- not today STAR- R/L foot 4x ea- forward, lateral, reverse lunge/weight shift on/off stationary leg Tandem walk x 4 laps on line   Manual tx: 1st ray mob x 15, manual great toe ext x 2 min and 1 MTP  P/A mob x 30 seconds gr IV, 3 rounds  PATIENT EDUCATION:  Education details: PT POC/goals, gait mechanics on stairs, gait mechanics on flat surface Person educated: Patient Education method: Explanation, Demonstration, and Handouts Education comprehension: verbalized understanding  HOME EXERCISE PROGRAM: Access Code: [email or web address] URL: [email or web address] Date: 03/10/2024 Prepared by: Vernell Reges  Exercises - Standing Gastroc Stretch  - 2 x daily - 7 x weekly - 3 sets - 20 hold - Long Sitting Ankle Pumps  - 1 x daily - 7 x weekly - 3 sets - 10 reps - Single Leg Stance  - 1 x daily - 7 x weekly - Tandem Walking  - 1 x daily - 7 x weekly - Backward Tandem Walking  - 1 x daily - 7 x weekly  ASSESSMENT:  CLINICAL IMPRESSION: Pt arrived motivated to participate in PT today.  Progressed unilateral L gastrocsoleus strengthening as pt still lacks strength to perform single LE heel raise.  Dynamic balance improving as noted by her ability to perform multi directional mini lunges without major loss of balance.  Would benefit from continued PT to address impairments associated with L LE strength, balance, and walking tolerance as she is getting ready to have R foot surgery at the end of this month.  OBJECTIVE IMPAIRMENTS: Abnormal gait, decreased activity tolerance, decreased balance, difficulty walking, decreased ROM, decreased strength, and pain.   ACTIVITY LIMITATIONS: standing, squatting, stairs, and locomotion level  PARTICIPATION LIMITATIONS: meal prep, cleaning, laundry, interpersonal relationship, shopping, community activity, and yard work  PERSONAL FACTORS: Time since  onset of injury/illness/exacerbation are also affecting patient's functional outcome.   REHAB POTENTIAL: Excellent  CLINICAL DECISION MAKING: Stable/uncomplicated  EVALUATION COMPLEXITY: Low   GOALS: Goals reviewed with patient? Yes  SHORT TERM GOALS: Target date: 06/14/24 Pt will demonstrate  ability to perform HEP for ankle/foot ROM and strengthening and proprioception with proper technique and <3 PT tactile cues on form Baseline: updated today Goal status: in progress   LONG TERM GOALS: Target date: 07/09/24  Improve LEFS by 20 points indicating pt able to perform her daily activities without being as significantly limited by L ankle/foot  Baseline: 30/80; 7/14: 56/80; 05/29/24 50/80 Goal status: In progress  2.  Improve ankle PF strength >1 MMT grade to facilitate improved ability to stand for her party planning and ambulate on even/uneven surfaces x 1 hour at a time Baseline: 2/5 strength; 7/14: able to perform unilateral L heel raise through partial ROM now (2+/5); 7/24: same 2+/5; 05/29/24 3/5 Goal status: In progress  3.  Improve L SLS to >10 seconds to facilitate improved balance/decreased fall risk while standing and amb on uneven surfaces and on cruise ship when she travels this Fall wearing normal shoes Baseline: 2 sec; 7/14: 5 seconds; 7/24: 10, 05/29/24: 5 seconds- with shoes on Goal status: in progress   PLAN:  PT FREQUENCY: 2x/week  PT DURATION: 6 weeks  PLANNED INTERVENTIONS: 97110-Therapeutic exercises, 97530- Therapeutic activity, 97112- Neuromuscular re-education, 97535- Self Care, and 02859- Manual therapy  PLAN FOR NEXT SESSION: SL balance exercises, toe stretches, intrinsic foot mm strengthening, strengthening for L ankle- added PF with black TB 2x20 to HEP daily.  Vernell Reges, PT, DPT, OCS  Vernell FORBES Reges, PT 06/28/2024, 9:02 AM

## 2024-06-28 NOTE — Telephone Encounter (Signed)
 Pt called that she having Equilibrium off she think she is having ear infection advised her to go urgent care or we see her tomorrow

## 2024-06-29 ENCOUNTER — Telehealth: Payer: Self-pay | Admitting: Internal Medicine

## 2024-06-29 ENCOUNTER — Encounter: Payer: Self-pay | Admitting: Nurse Practitioner

## 2024-06-29 ENCOUNTER — Ambulatory Visit: Admitting: Internal Medicine

## 2024-06-29 VITALS — BP 136/84 | HR 76 | Temp 96.7°F | Resp 16 | Ht 63.0 in | Wt 181.8 lb

## 2024-06-29 DIAGNOSIS — H8113 Benign paroxysmal vertigo, bilateral: Secondary | ICD-10-CM

## 2024-06-29 DIAGNOSIS — R030 Elevated blood-pressure reading, without diagnosis of hypertension: Secondary | ICD-10-CM | POA: Diagnosis not present

## 2024-06-29 DIAGNOSIS — J300 Vasomotor rhinitis: Secondary | ICD-10-CM | POA: Diagnosis not present

## 2024-06-29 DIAGNOSIS — J0101 Acute recurrent maxillary sinusitis: Secondary | ICD-10-CM

## 2024-06-29 MED ORDER — AZITHROMYCIN 250 MG PO TABS: ORAL_TABLET | ORAL | 0 refills | Status: DC

## 2024-06-29 MED ORDER — MECLIZINE HCL 12.5 MG PO TABS: ORAL_TABLET | ORAL | 1 refills | Status: AC

## 2024-06-29 MED ORDER — PREDNISONE 10 MG PO TABS: ORAL_TABLET | ORAL | 0 refills | Status: DC

## 2024-06-29 NOTE — Progress Notes (Signed)
 [facility] [address] [place], [place] [address]  Internal MEDICINE  Office Visit Note  Patient Name: Paula [name]  [id number]  [id number]  Date of Service: 06/29/2024  Chief Complaint  Patient presents with   Acute Visit    Equilibrium off    HPI Patient is seen for acute visit When she was sick with acute sinusitis, nasal congestion cough headache and fullness in the ears she has been taking just over-the-counter medications For the last few days patient has been experiencing years of vertigo with postural change, denies any nausea or vomiting Baseline blood pressure has been slightly elevated today    Current Medication: Outpatient Encounter Medications as of 06/29/2024  Medication Sig   azithromycin  (ZITHROMAX ) 250 MG tablet Take one tab a day for 10 days for uri   meclizine (ANTIVERT) 12.5 MG tablet One tab po tid prn for dizziness   predniSONE  (DELTASONE ) 10 MG tablet Take one tab 3 x day for 3 days, then take one tab 2 x a day for 3 days and then take one tab a day for 3 days for copd   celecoxib  (CELEBREX ) 100 MG capsule Take 1 capsule (100 mg total) by mouth 2 (two) times daily.   EPINEPHrine  0.3 mg/0.3 mL IJ SOAJ injection Inject 0.3 mg into the muscle as needed for anaphylaxis. (Patient not taking: Reported on 06/29/2024)   fexofenadine-pseudoephedrine (ALLEGRA-D 24) 180-240 MG 24 hr tablet Take 1 tablet by mouth daily. (Patient not taking: Reported on 06/29/2024)   fluticasone  (FLONASE ) 50 MCG/ACT nasal spray Place 2 sprays into both nostrils daily.   LORazepam  (ATIVAN ) 1 MG tablet Take 1 tablet (1 mg total) by mouth at bedtime as needed for anxiety or sleep.   mupirocin  ointment (BACTROBAN ) 2 % Apply 1 Application topically 2 (two) times daily as needed.   nitrofurantoin , macrocrystal-monohydrate, (MACROBID ) 100 MG capsule Take 1 cap twice per day for 10 days. (Patient not taking: Reported on 06/29/2024)   triamcinolone  cream (KENALOG ) 0.1 % Apply 1  Application topically 2 (two) times daily as needed (itchy insect stings). (Patient not taking: Reported on 06/29/2024)   [DISCONTINUED] predniSONE  (STERAPRED UNI-PAK 21 TAB) 10 MG (21) TBPK tablet Use as directed for 6 days, start tomorrow 03/23/24 (Patient not taking: Reported on 06/29/2024)   No facility-administered encounter medications on file as of 06/29/2024.    Surgical History: Past Surgical History:  Procedure Laterality Date   AUGMENTATION MAMMAPLASTY Bilateral 1987   HERNIA REPAIR  1962   skin cancer   08/11/2016   removal   TUBAL LIGATION  08/11/2016    Medical History: Past Medical History:  Diagnosis Date   Asthma    Environmental allergies    GERD (gastroesophageal reflux disease)    Hypertension    Migraine    Sinusitis     Family History: Family History  Problem Relation Age of Onset   Breast cancer Cousin    COPD Mother    Lung cancer Mother    Hypertension Mother    Ulcers Father    Bowel Disease Father        blockage, sepsis    Social History   Socioeconomic History   Marital status: Married    Spouse name: Not on file   Number of children: Not on file   Years of education: Not on file   Highest education level: Not on file  Occupational History   Not on file  Tobacco Use   Smoking status: Former  Current packs/day: 0.00    Types: Cigarettes    Quit date: 12/01/2004    Years since quitting: 19.5   Smokeless tobacco: Never  Vaping Use   Vaping status: Never Used  Substance and Sexual Activity   Alcohol use: Yes    Comment: ocassionally   Drug use: No   Sexual activity: Not on file  Other Topics Concern   Not on file  Social History Narrative   Not on file   Social Drivers of Health   Financial Resource Strain: Not on file  Food Insecurity: Not on file  Transportation Needs: Not on file  Physical Activity: Not on file  Stress: Not on file  Social Connections: Not on file  Intimate Partner Violence: Not on file       Review of Systems  Constitutional:  Negative for chills, fatigue and unexpected weight change.  HENT:  Positive for congestion, postnasal drip, sinus pressure and voice change. Negative for rhinorrhea, sneezing and sore throat.   Eyes:  Negative for redness.  Respiratory:  Positive for cough and wheezing. Negative for chest tightness and shortness of breath.   Cardiovascular:  Negative for chest pain and palpitations.  Gastrointestinal:  Negative for abdominal pain, constipation, diarrhea, nausea and vomiting.  Genitourinary:  Negative for dysuria and frequency.  Musculoskeletal:  Negative for arthralgias, back pain, joint swelling and neck pain.  Skin:  Negative for rash.  Neurological: Negative.  Negative for tremors and numbness.  Hematological:  Negative for adenopathy. Does not bruise/bleed easily.  Psychiatric/Behavioral:  Negative for behavioral problems (Depression), sleep disturbance and suicidal ideas. The patient is not nervous/anxious.     Vital Signs: BP 136/84   Pulse 76   Temp (!) 96.7 F (35.9 C)   Resp 16   Ht 5' 3 (1.6 m)   Wt 181 lb 12.8 oz (82.5 kg)   SpO2 95%   BMI 32.20 kg/m    Physical Exam Constitutional:      Appearance: Normal appearance.  HENT:     Head: Normocephalic and atraumatic.     Ears:     Comments: Tympanic membrane is congested right more than left     Nose: Nose normal.     Mouth/Throat:     Mouth: Mucous membranes are moist.     Pharynx: No posterior oropharyngeal erythema.  Eyes:     Extraocular Movements: Extraocular movements intact.     Pupils: Pupils are equal, round, and reactive to light.  Cardiovascular:     Pulses: Normal pulses.     Heart sounds: Normal heart sounds.  Pulmonary:     Effort: Pulmonary effort is normal.     Breath sounds: Normal breath sounds.  Neurological:     General: No focal deficit present.     Mental Status: She is alert.  Psychiatric:        Mood and Affect: Mood normal.         Behavior: Behavior normal.        Assessment/Plan: 1. Benign paroxysmal positional vertigo due to bilateral vestibular disorder (Primary) Patient given prescription of Antivert to be taken as needed - Ambulatory referral to ENT - meclizine (ANTIVERT) 12.5 MG tablet; One tab po tid prn for dizziness  Dispense: 30 tablet; Refill: 1  2. Acute recurrent maxillary sinusitis Patient is acutely congested will go ahead, start therapy as prescribed - predniSONE  (DELTASONE ) 10 MG tablet; Take one tab 3 x day for 3 days, then take one tab 2 x a day  for 3 days and then take one tab a day for 3 days for copd  Dispense: 18 tablet; Refill: 0 - azithromycin  (ZITHROMAX ) 250 MG tablet; Take one tab a day for 10 days for uri  Dispense: 10 tablet; Refill: 0  3. Vasomotor rhinitis Patient is to continue her Flonase  over-the-counter  4. Elevated blood pressure reading in office without diagnosis of hypertension Patient is to monitor her blood pressure at home and discuss on follow-up visit with the provider  General Counseling: Romero oakland understanding of the findings of todays visit and agrees with plan of treatment. I have discussed any further diagnostic evaluation that may be needed or ordered today. We also reviewed her medications today. she has been encouraged to call the office with any questions or concerns that should arise related to todays visit.    Orders Placed This Encounter  Procedures   Ambulatory referral to ENT    Meds ordered this encounter  Medications   meclizine (ANTIVERT) 12.5 MG tablet    Sig: One tab po tid prn for dizziness    Dispense:  30 tablet    Refill:  1   predniSONE  (DELTASONE ) 10 MG tablet    Sig: Take one tab 3 x day for 3 days, then take one tab 2 x a day for 3 days and then take one tab a day for 3 days for copd    Dispense:  18 tablet    Refill:  0   azithromycin  (ZITHROMAX ) 250 MG tablet    Sig: Take one tab a day for 10 days for uri    Dispense:   10 tablet    Refill:  0    Total time spent:30 Minutes Time spent includes review of chart, medications, test results, and follow up plan with the patient.   [place] Controlled Substance Database was reviewed by me.   Dr Luretta Everly M Saydee Zolman Internal medicine

## 2024-06-29 NOTE — Telephone Encounter (Signed)
 Patient will call back when she is ready for me to send out her Otolaryngology referral-Toni

## 2024-07-03 ENCOUNTER — Encounter

## 2024-07-05 ENCOUNTER — Encounter

## 2024-07-10 ENCOUNTER — Encounter: Admitting: Podiatry

## 2024-07-10 ENCOUNTER — Encounter

## 2024-07-12 ENCOUNTER — Telehealth: Payer: Self-pay | Admitting: Podiatry

## 2024-07-12 ENCOUNTER — Ambulatory Visit

## 2024-07-12 DIAGNOSIS — M25672 Stiffness of left ankle, not elsewhere classified: Secondary | ICD-10-CM | POA: Diagnosis not present

## 2024-07-12 DIAGNOSIS — R29898 Other symptoms and signs involving the musculoskeletal system: Secondary | ICD-10-CM

## 2024-07-12 DIAGNOSIS — M25675 Stiffness of left foot, not elsewhere classified: Secondary | ICD-10-CM

## 2024-07-12 DIAGNOSIS — M722 Plantar fascial fibromatosis: Secondary | ICD-10-CM | POA: Diagnosis not present

## 2024-07-12 NOTE — Telephone Encounter (Signed)
 Patient called in regards to surgery. Patients mother in law passed away and they are hosting the service and family at their home. Patient has been rescheduled to 07/31/2024. Have updated the information for post ops and informed [facility]

## 2024-07-12 NOTE — Therapy (Signed)
 OUTPATIENT PHYSICAL THERAPY LOWER EXTREMITY TREATMENT /Re-Certification through 07/09/24   Patient Name: Paula [name] MRN: [id number] DOB:September 16, 1955, 68 y.o., female Today's Date: 07/12/2024  END OF SESSION:      Past Medical History:  Diagnosis Date   Asthma    Environmental allergies    GERD (gastroesophageal reflux disease)    Hypertension    Migraine    Sinusitis    Past Surgical History:  Procedure Laterality Date   AUGMENTATION MAMMAPLASTY Bilateral 1987   HERNIA REPAIR  1962   skin cancer   08/11/2016   removal   TUBAL LIGATION  08/11/2016   Patient Active Problem List   Diagnosis Date Noted   Dermatochalasis of both upper eyelids 07/31/2021   Ptosis of both eyebrows 07/31/2021   Allergic rhinitis due to pollen 06/26/2020   Poison ivy dermatitis 05/26/2020   Facet syndrome, lumbar 05/26/2020   Hair loss 05/26/2020   Encounter for routine adult health examination with abnormal findings 09/25/2019   Routine cervical smear 09/25/2019   Screening for osteoporosis 09/25/2019   Essential hypertension 07/06/2019   Generalized anxiety disorder 07/06/2019   Elevated blood pressure reading in office without diagnosis of hypertension 04/06/2019   Generalized abdominal discomfort 03/23/2019   Screening for breast cancer 09/23/2018   Other fatigue 09/23/2018   Headache syndrome 09/23/2018   Vitamin D  deficiency 09/23/2018   Fever blister 09/23/2018   History of malignant melanoma of skin 09/23/2018   Acute upper respiratory infection 08/24/2018   Acute sinusitis 09/21/2017   Unspecified otitis externa, bilateral 09/21/2017   Gastro-esophageal reflux disease without esophagitis 09/21/2017   Insomnia, unspecified 09/21/2017   Unspecified ovarian cyst, right side 09/21/2017   Unspecified ovarian cyst, left side 09/21/2017   Nasal mucositis (ulcerative) 09/21/2017   Allergic rhinitis, unspecified 09/21/2017   Cerebrovascular disease, unspecified 09/21/2017    Herpesviral infection, unspecified 09/21/2017   Varicella without complication 09/21/2017   Dysuria 09/21/2017   Abnormal weight gain 09/21/2017    PCP: Dr. Liana  REFERRING PROVIDER: Dr. Silva  REFERRING DIAG:  M72.2 (ICD-10-CM) - Plantar fasciitis of left foot  M62.462 (ICD-10-CM) - Gastrocnemius equinus, left    THERAPY DIAG:  No diagnosis found.  Rationale for Evaluation and Treatment: Rehabilitation  ONSET DATE: March 2025  SUBJECTIVE:   SUBJECTIVE STATEMENT: Pt reports having L plantar faciitis; underwent surgery end of Feb or early March: plantar fascia release, gastroc lengthening, 5th toe alignment.  She was in a boot NWB x 4 weeks.  Started wearing shoes in April and bearing weight on foot.  She states she saw the doctor last week and is having numbness in her great toe/2nd toe region and will be having another surgery to remove the plate in her foot which is irritating a nerve.  C/o: difficulty going down stairs, difficulty with her balance, and difficulty with prolonged standing and walking.  Hasn't ventured out to walk in her yard, has been mainly staying on concrete or flat surfaces.  PERTINENT HISTORY: Foot surgery  PAIN:  Are you having pain? Yes, parasthesias in L foot- 4/10  PRECAUTIONS: None  RED FLAGS: None   WEIGHT BEARING RESTRICTIONS: no  FALLS:  Has patient fallen in last 6 months? Yes. Number of falls 1, early on after surgery but none recently  LIVING ENVIRONMENT: Lives with: lives with their family Lives in: House/apartment Stairs: flight of stairs to get upstairs to second floor of home; 4-5 to enter home with railing Has following equipment at home:   OCCUPATION: retired  PLOF: Independent  PATIENT GOALS: to be able to resume her normal activities again without being limited by L foot- this includes shopping, gardening, party planning, taking cruises, and enjoying spending time in her pool  NEXT MD VISIT: none reported  today  OBJECTIVE:  Note: Objective measures were completed at Evaluation unless otherwise noted.  PATIENT SURVEYS:  LEFS 30/80  COGNITION: Overall cognitive status: Within functional limits for tasks assessed     SENSATION: WFL  EDEMA:  No significant difference R vs L figure 8  POSTURE/GAIT: pt amb with decreased WB on L in stance phase  PALPATION: Pt reports tingling/parasthesias 1-2 toe  LOWER EXTREMITY ROM:  Active ROM Right eval Left eval  Hip flexion    Hip extension    Hip abduction    Hip adduction    Hip internal rotation    Hip external rotation    Knee flexion    Knee extension    Ankle dorsiflexion 10 12  Ankle plantarflexion 30 25  Ankle inversion    Ankle eversion     (Blank rows = not tested) Great toe: L 30 DF, 0 PF, R 70 DF and 30 PF  LOWER EXTREMITY MMT:  MMT Right eval Left eval  Hip flexion    Hip extension    Hip abduction    Hip adduction    Hip internal rotation    Hip external rotation    Knee flexion    Knee extension    Ankle dorsiflexion 5 4  Ankle plantarflexion 4 2  Ankle inversion 5 4  Ankle eversion 5 4   (Blank rows = not tested)    FUNCTIONAL TESTS:  SLS: L 2 sec, R x 10 sec Mini squat: able to perform Not able to perform heel raise in standing on L                                                                                                                               TREATMENT DATE: 06/28/24 Subjective:  Pt states she overall her foot is feeling better.  She feels like her toes are moving more this week.  Leaving for a cruise on Monday.  Heels like her balance is getting better.  Still lacks strength in calf.  Tried new exercise from last time at home and it went well.    Jul 14, 2024 has bunion surgery scheduled on the other foot.    Objective: Pain 0-1/10 L great toe  Updated goals/goal reassessment 05/29/24 Observation: pt's skin along dorsal first ray appears to be healing well along scar/incision  site, no redness  Gait: pt able to amb up/down stairs with reciprocal pattern; decreased L toe off during amb on even surfaces  Ankle AROM: DF 15 deg L, gr toe ext 10 deg  Ankle MMT: PF 3/5 (Able to perform 4 SL heel raise today), DF: 5/5, inv 4+/5, eversion 4/5  SLS: L 5 seconds with shoes on  and no UE support now   LEFS: 50 today; was 30 at initial eval; 56 on 04/06/24   Therapeutic Exercises: Lift toes, spread, then press down into towel: x10, 2 sets Standing calf stretch: 30 sec x 3- standing today with L foot behind, R in front Standing heel raises: 2 sets up 2/down L single x10  PF with black TB x20, 2 sets with PT PF with soft ball x20- not today  Nustep Level 3-4 x 10 min LE strength PROM L great toe flex/extension, toes 2-5 flex/extension, end range PF stretch: 1 min holds  Therapeutic Activities: Front step up on L LE to SLS on step x 5 second holds- for L LE strength/balance; 2x10 Lateral step down 2x10 Stairss- 3 laps  Tandem stance x 1 min ea direction Wall squats with physioball behind: x10, 2 sets with 3-5 sec holds Standing heel raises with toes out x 15, 2 sets SLS x 4 Step ups onto bosu with L foot x 20- forward x20 lateral up and over- not today STAR- R/L foot 4x ea- forward, lateral, reverse lunge/weight shift on/off stationary leg Tandem walk x 4 laps on line   Manual tx: 1st ray mob x 15, manual great toe ext x 2 min and 1 MTP P/A mob x 30 seconds gr IV, 3 rounds  PATIENT EDUCATION:  Education details: PT POC/goals, gait mechanics on stairs, gait mechanics on flat surface Person educated: Patient Education method: Explanation, Demonstration, and Handouts Education comprehension: verbalized understanding  HOME EXERCISE PROGRAM: Access Code: [email or web address] URL: [email or web address] Date: 03/10/2024 Prepared by: Vernell Reges  Exercises - Standing Gastroc Stretch  - 2 x daily - 7 x weekly - 3 sets - 20 hold - Long Sitting Ankle Pumps  -  1 x daily - 7 x weekly - 3 sets - 10 reps - Single Leg Stance  - 1 x daily - 7 x weekly - Tandem Walking  - 1 x daily - 7 x weekly - Backward Tandem Walking  - 1 x daily - 7 x weekly  ASSESSMENT:  CLINICAL IMPRESSION: Pt arrived motivated to participate in PT today.  Progressed unilateral L gastrocsoleus strengthening as pt still lacks strength to perform single LE heel raise.  Dynamic balance improving as noted by her ability to perform multi directional mini lunges without major loss of balance.  Would benefit from continued PT to address impairments associated with L LE strength, balance, and walking tolerance as she is getting ready to have R foot surgery at the end of this month.  OBJECTIVE IMPAIRMENTS: Abnormal gait, decreased activity tolerance, decreased balance, difficulty walking, decreased ROM, decreased strength, and pain.   ACTIVITY LIMITATIONS: standing, squatting, stairs, and locomotion level  PARTICIPATION LIMITATIONS: meal prep, cleaning, laundry, interpersonal relationship, shopping, community activity, and yard work  PERSONAL FACTORS: Time since onset of injury/illness/exacerbation are also affecting patient's functional outcome.   REHAB POTENTIAL: Excellent  CLINICAL DECISION MAKING: Stable/uncomplicated  EVALUATION COMPLEXITY: Low   GOALS: Goals reviewed with patient? Yes  SHORT TERM GOALS: Target date: 06/14/24 Pt will demonstrate ability to perform HEP for ankle/foot ROM and strengthening and proprioception with proper technique and <3 PT tactile cues on form Baseline: updated today Goal status: in progress   LONG TERM GOALS: Target date: 07/09/24  Improve LEFS by 20 points indicating pt able to perform her daily activities without being as significantly limited by L ankle/foot  Baseline: 30/80; 7/14: 56/80; 05/29/24 50/80 Goal status: In progress  2.  Improve ankle PF  strength >1 MMT grade to facilitate improved ability to stand for her party planning and  ambulate on even/uneven surfaces x 1 hour at a time Baseline: 2/5 strength; 7/14: able to perform unilateral L heel raise through partial ROM now (2+/5); 7/24: same 2+/5; 05/29/24 3/5 Goal status: In progress  3.  Improve L SLS to >10 seconds to facilitate improved balance/decreased fall risk while standing and amb on uneven surfaces and on cruise ship when she travels this Fall wearing normal shoes Baseline: 2 sec; 7/14: 5 seconds; 7/24: 10, 05/29/24: 5 seconds- with shoes on Goal status: in progress   PLAN:  PT FREQUENCY: 2x/week  PT DURATION: 6 weeks  PLANNED INTERVENTIONS: 97110-Therapeutic exercises, 97530- Therapeutic activity, 97112- Neuromuscular re-education, 97535- Self Care, and [id number]- Manual therapy  PLAN FOR NEXT SESSION: SL balance exercises, toe stretches, intrinsic foot mm strengthening, strengthening for L ankle- added PF with black TB 2x20 to HEP daily.  Vernell Reges, PT, DPT, OCS  Vernell FORBES Reges, PT 07/12/2024, 11:07 AM

## 2024-07-17 ENCOUNTER — Ambulatory Visit

## 2024-07-19 ENCOUNTER — Encounter: Admitting: Podiatry

## 2024-07-19 ENCOUNTER — Ambulatory Visit: Attending: Podiatry

## 2024-07-19 DIAGNOSIS — M25672 Stiffness of left ankle, not elsewhere classified: Secondary | ICD-10-CM | POA: Insufficient documentation

## 2024-07-19 DIAGNOSIS — M25675 Stiffness of left foot, not elsewhere classified: Secondary | ICD-10-CM | POA: Insufficient documentation

## 2024-07-19 DIAGNOSIS — M722 Plantar fascial fibromatosis: Secondary | ICD-10-CM | POA: Diagnosis not present

## 2024-07-19 DIAGNOSIS — R29898 Other symptoms and signs involving the musculoskeletal system: Secondary | ICD-10-CM | POA: Diagnosis not present

## 2024-07-19 NOTE — Therapy (Signed)
 OUTPATIENT PHYSICAL THERAPY LOWER EXTREMITY TREATMENT 07/12/24 /Re-Certification through 08/05/24   Patient Name: Paula [name] MRN: [id number] DOB:1955/10/03, 68 y.o., female Today's Date: 07/19/2024  END OF SESSION:  PT End of Session - 07/19/24 0903     Visit Number 20    Number of Visits 25    Date for Recertification  08/05/24    Authorization Type 1-2x/week x 4 weeks (PN needed at visit #24 for medicare)    PT Start Time 0900    PT Stop Time 0945    PT Time Calculation (min) 45 min    Activity Tolerance Patient tolerated treatment well    Behavior During Therapy [facility] for tasks assessed/performed             Past Medical History:  Diagnosis Date   Asthma    Environmental allergies    GERD (gastroesophageal reflux disease)    Hypertension    Migraine    Sinusitis    Past Surgical History:  Procedure Laterality Date   AUGMENTATION MAMMAPLASTY Bilateral 1987   HERNIA REPAIR  1962   skin cancer   08/11/2016   removal   TUBAL LIGATION  08/11/2016   Patient Active Problem List   Diagnosis Date Noted   Dermatochalasis of both upper eyelids 07/31/2021   Ptosis of both eyebrows 07/31/2021   Allergic rhinitis due to pollen 06/26/2020   Poison ivy dermatitis 05/26/2020   Facet syndrome, lumbar 05/26/2020   Hair loss 05/26/2020   Encounter for routine adult health examination with abnormal findings 09/25/2019   Routine cervical smear 09/25/2019   Screening for osteoporosis 09/25/2019   Essential hypertension 07/06/2019   Generalized anxiety disorder 07/06/2019   Elevated blood pressure reading in office without diagnosis of hypertension 04/06/2019   Generalized abdominal discomfort 03/23/2019   Screening for breast cancer 09/23/2018   Other fatigue 09/23/2018   Headache syndrome 09/23/2018   Vitamin D  deficiency 09/23/2018   Fever blister 09/23/2018   History of malignant melanoma of skin 09/23/2018   Acute upper respiratory infection 08/24/2018   Acute  sinusitis 09/21/2017   Unspecified otitis externa, bilateral 09/21/2017   Gastro-esophageal reflux disease without esophagitis 09/21/2017   Insomnia, unspecified 09/21/2017   Unspecified ovarian cyst, right side 09/21/2017   Unspecified ovarian cyst, left side 09/21/2017   Nasal mucositis (ulcerative) 09/21/2017   Allergic rhinitis, unspecified 09/21/2017   Cerebrovascular disease, unspecified 09/21/2017   Herpesviral infection, unspecified 09/21/2017   Varicella without complication 09/21/2017   Dysuria 09/21/2017   Abnormal weight gain 09/21/2017    PCP: Dr. Liana  REFERRING PROVIDER: Dr. Silva  REFERRING DIAG:  M72.2 (ICD-10-CM) - Plantar fasciitis of left foot  M62.462 (ICD-10-CM) - Gastrocnemius equinus, left    THERAPY DIAG:  Stiffness of left ankle, not elsewhere classified  Ankle weakness  Stiffness of left foot, not elsewhere classified  Plantar fasciitis  Rationale for Evaluation and Treatment: Rehabilitation  ONSET DATE: March 2025  SUBJECTIVE:   SUBJECTIVE STATEMENT: Pt reports having L plantar faciitis; underwent surgery end of Feb or early March: plantar fascia release, gastroc lengthening, 5th toe alignment.  She was in a boot NWB x 4 weeks.  Started wearing shoes in April and bearing weight on foot.  She states she saw the doctor last week and is having numbness in her great toe/2nd toe region and will be having another surgery to remove the plate in her foot which is irritating a nerve.  C/o: difficulty going down stairs, difficulty with her balance, and difficulty with prolonged  standing and walking.  Hasn't ventured out to walk in her yard, has been mainly staying on concrete or flat surfaces.  PERTINENT HISTORY: Foot surgery  PAIN:  Are you having pain? Yes, parasthesias in L foot- 4/10  PRECAUTIONS: None  RED FLAGS: None   WEIGHT BEARING RESTRICTIONS: no  FALLS:  Has patient fallen in last 6 months? Yes. Number of falls 1, early on  after surgery but none recently  LIVING ENVIRONMENT: Lives with: lives with their family Lives in: House/apartment Stairs: flight of stairs to get upstairs to second floor of home; 4-5 to enter home with railing Has following equipment at home:   OCCUPATION: retired  PLOF: Independent  PATIENT GOALS: to be able to resume her normal activities again without being limited by L foot- this includes shopping, gardening, party planning, taking cruises, and enjoying spending time in her pool  NEXT MD VISIT: none reported today  OBJECTIVE:  Note: Objective measures were completed at Evaluation unless otherwise noted.  PATIENT SURVEYS:  LEFS 30/80  COGNITION: Overall cognitive status: Within functional limits for tasks assessed     SENSATION: WFL  EDEMA:  No significant difference R vs L figure 8  POSTURE/GAIT: pt amb with decreased WB on L in stance phase  PALPATION: Pt reports tingling/parasthesias 1-2 toe  LOWER EXTREMITY ROM:  Active ROM Right eval Left eval  Hip flexion    Hip extension    Hip abduction    Hip adduction    Hip internal rotation    Hip external rotation    Knee flexion    Knee extension    Ankle dorsiflexion 10 12  Ankle plantarflexion 30 25  Ankle inversion    Ankle eversion     (Blank rows = not tested) Great toe: L 30 DF, 0 PF, R 70 DF and 30 PF  LOWER EXTREMITY MMT:  MMT Right eval Left eval  Hip flexion    Hip extension    Hip abduction    Hip adduction    Hip internal rotation    Hip external rotation    Knee flexion    Knee extension    Ankle dorsiflexion 5 4  Ankle plantarflexion 4 2  Ankle inversion 5 4  Ankle eversion 5 4   (Blank rows = not tested)    FUNCTIONAL TESTS:  SLS: L 2 sec, R x 10 sec Mini squat: able to perform Not able to perform heel raise in standing on L                                                                                                                               TREATMENT DATE:  07/19/24 Subjective:  Pt states she is doing well; Her other surgery got rescheduled to August 01, 2024 due to a death/funeral in her family.  Would like to continue building strength and attending PT to get as strong as she can in L LE before she becomes non  WB on R foot after surgery in a few weeks.  No falls.  Has continued to work on her HEP.  Jul 31, 2024 has bunion surgery scheduled on the other foot.    Objective: Pain 0-1/10 L great toe  Therapeutic Exercises: not today Lift toes, spread, then press down into towel: x10, 2 sets Nustep Level 34 x 10 min LE strength PROM L great toe flex/extension, toes 2-5 flex/extension, end range PF stretch: 1 min holds  Therapeutic Activities: Front step up on L LE to SLS on step x 5 second holds- for L LE strength/balance; 2x10 Lateral step down 2x10 Stairs- 3 laps  Tandem stance x 1 min ea direction Wall squats with physioball behind: x10, 2 sets with 3-5 sec holds Standing heel raises with toes forward, and toes out x 15, 2 sets ea Eccentric up 2 down 1 x 10 L  Neuro re-ed: SLS x 4 Step ups onto bosu with L foot x 20- forward x20 lateral up and over STAR- R/L foot 4x ea- forward, lateral, reverse lunge/weight shift on/off stationary leg Tandem walk x 4 laps on line Ladder drill: marches, lateral/forward progression with slow SL stance 4x Airex: feet tandem- chest pass 5# med ball to trampoline, 2x15   Manual tx: 1st ray mob x 15, manual great toe ext x 2 min and 1 MTP P/A mob x 30 seconds gr IV, 3 rounds, manual ray abd with STM deep intrinsic mm  PATIENT EDUCATION:  Education details: PT POC/goals, gait mechanics on stairs, gait mechanics on flat surface Person educated: Patient Education method: Explanation, Demonstration, and Handouts Education comprehension: verbalized understanding  HOME EXERCISE PROGRAM: Access Code: [email or web address] URL: [email or web address] Date: 03/10/2024 Prepared by: Vernell Reges  Exercises -  Standing Gastroc Stretch  - 2 x daily - 7 x weekly - 3 sets - 20 hold - Long Sitting Ankle Pumps  - 1 x daily - 7 x weekly - 3 sets - 10 reps - Single Leg Stance  - 1 x daily - 7 x weekly - Tandem Walking  - 1 x daily - 7 x weekly - Backward Tandem Walking  - 1 x daily - 7 x weekly  ASSESSMENT:  CLINICAL IMPRESSION: Pt arrived motivated to participate in PT today.  She is making progress towards PT goals.  SL balance has improved and Gr toe ROM has improved on L.  Demonstrated good balance today with airex dynamic balance activities.  Would benefit from continued PT to address impairments associated with L LE strength, balance, and walking tolerance as she is getting ready to have R foot surgery next month and will be NWB on R LE for a period of time requiring full strength in L LE.  OBJECTIVE IMPAIRMENTS: Abnormal gait, decreased activity tolerance, decreased balance, difficulty walking, decreased ROM, decreased strength, and pain.   ACTIVITY LIMITATIONS: standing, squatting, stairs, and locomotion level  PARTICIPATION LIMITATIONS: meal prep, cleaning, laundry, interpersonal relationship, shopping, community activity, and yard work  PERSONAL FACTORS: Time since onset of injury/illness/exacerbation are also affecting patient's functional outcome.   REHAB POTENTIAL: Excellent  CLINICAL DECISION MAKING: Stable/uncomplicated  EVALUATION COMPLEXITY: Low   GOALS: Goals reviewed with patient? Yes  SHORT TERM GOALS: Target date: 06/14/24 Pt will demonstrate ability to perform HEP for ankle/foot ROM and strengthening and proprioception with proper technique and <3 PT tactile cues on form Baseline: updated today Goal status: MET   LONG TERM GOALS: Target date: 08/05/24  Improve LEFS by 20 points indicating pt able  to perform her daily activities without being as significantly limited by L ankle/foot  Baseline: 30/80; 7/14: 56/80; 05/29/24 50/80; 10/29: 60/80 Goal status: In progress  2.   Improve ankle PF strength >1 MMT grade to facilitate improved ability to stand for her party planning and ambulate on even/uneven surfaces x 1 hour at a time Baseline: 2/5 strength; 7/14: able to perform unilateral L heel raise through partial ROM now (2+/5); 7/24: same 2+/5; 05/29/24 3/5; 10/29: 3/5 Goal status: In progress  3.  Improve L SLS to >10 seconds to facilitate improved balance/decreased fall risk while standing and amb on uneven surfaces and on cruise ship when she travels this Fall wearing normal shoes Baseline: 2 sec; 7/14: 5 seconds; 7/24: 10, 05/29/24: 5 seconds- with shoes on; 10/31 15 sec Goal status: Met   PLAN:  PT FREQUENCY: 2x/week  PT DURATION: 4 weeks  PLANNED INTERVENTIONS: 97110-Therapeutic exercises, 97530- Therapeutic activity, 97112- Neuromuscular re-education, 97535- Self Care, and 02859- Manual therapy  PLAN FOR NEXT SESSION: SL balance exercises, toe stretches, intrinsic foot mm strengthening, strengthening for L ankle  Vernell Reges, PT, DPT, OCS  Vernell FORBES Reges, PT 07/19/2024, 9:03 AM

## 2024-07-24 ENCOUNTER — Ambulatory Visit (INDEPENDENT_AMBULATORY_CARE_PROVIDER_SITE_OTHER): Admitting: Nurse Practitioner

## 2024-07-24 ENCOUNTER — Encounter: Payer: Self-pay | Admitting: Nurse Practitioner

## 2024-07-24 VITALS — BP 134/86 | HR 69 | Temp 96.0°F | Resp 16 | Ht 63.0 in | Wt 180.6 lb

## 2024-07-24 DIAGNOSIS — E559 Vitamin D deficiency, unspecified: Secondary | ICD-10-CM

## 2024-07-24 DIAGNOSIS — E782 Mixed hyperlipidemia: Secondary | ICD-10-CM | POA: Diagnosis not present

## 2024-07-24 DIAGNOSIS — F411 Generalized anxiety disorder: Secondary | ICD-10-CM

## 2024-07-24 DIAGNOSIS — J301 Allergic rhinitis due to pollen: Secondary | ICD-10-CM

## 2024-07-24 DIAGNOSIS — Z79899 Other long term (current) drug therapy: Secondary | ICD-10-CM

## 2024-07-24 DIAGNOSIS — H8113 Benign paroxysmal vertigo, bilateral: Secondary | ICD-10-CM | POA: Diagnosis not present

## 2024-07-24 DIAGNOSIS — L659 Nonscarring hair loss, unspecified: Secondary | ICD-10-CM

## 2024-07-24 DIAGNOSIS — E538 Deficiency of other specified B group vitamins: Secondary | ICD-10-CM | POA: Diagnosis not present

## 2024-07-24 MED ORDER — LORAZEPAM 1 MG PO TABS: 1.0000 mg | ORAL_TABLET | Freq: Every evening | ORAL | 2 refills | Status: AC | PRN

## 2024-07-24 MED ORDER — FLUTICASONE PROPIONATE 50 MCG/ACT NA SUSP: 2.0000 | Freq: Every day | NASAL | 6 refills | Status: AC

## 2024-07-24 NOTE — Progress Notes (Signed)
 [facility] [address] [place], [place] [address]  Internal MEDICINE  Office Visit Note  Patient Name: Paula [name]  [id number]  [id number]  Date of Service: 07/24/2024  Chief Complaint  Patient presents with   Gastroesophageal Reflux   Hypertension   Follow-up   Quality Metric Gaps    Mammogram and colonoscopy     HPI Paula [name] presents for a follow-up visit for lab orders, refills, anxiety, vertigo and allergic rhinitis.  GAD -- takes lorazepam  as needed, due for refills.  Allergic rhinitis -- uses flonase , needs refills Due for routine labs  Vertigo -- sometimes having dizzy spells off and on.     Current Medication: Outpatient Encounter Medications as of 07/24/2024  Medication Sig   fexofenadine-pseudoephedrine (ALLEGRA-D 24) 180-240 MG 24 hr tablet Take 1 tablet by mouth daily.   celecoxib  (CELEBREX ) 100 MG capsule Take 1 capsule (100 mg total) by mouth 2 (two) times daily.   EPINEPHrine  0.3 mg/0.3 mL IJ SOAJ injection Inject 0.3 mg into the muscle as needed for anaphylaxis.   fluticasone  (FLONASE ) 50 MCG/ACT nasal spray Place 2 sprays into both nostrils daily.   LORazepam  (ATIVAN ) 1 MG tablet Take 1 tablet (1 mg total) by mouth at bedtime as needed for anxiety or sleep.   meclizine  (ANTIVERT ) 12.5 MG tablet One tab po tid prn for dizziness   mupirocin  ointment (BACTROBAN ) 2 % Apply 1 Application topically 2 (two) times daily as needed.   predniSONE  (DELTASONE ) 10 MG tablet Take one tab 3 x day for 3 days, then take one tab 2 x a day for 3 days and then take one tab a day for 3 days for copd   triamcinolone  cream (KENALOG ) 0.1 % Apply 1 Application topically 2 (two) times daily as needed (itchy insect stings).   [DISCONTINUED] azithromycin  (ZITHROMAX ) 250 MG tablet Take one tab a day for 10 days for uri   [DISCONTINUED] fluticasone  (FLONASE ) 50 MCG/ACT nasal spray Place 2 sprays into both nostrils daily.   [DISCONTINUED] LORazepam  (ATIVAN ) 1 MG tablet Take  1 tablet (1 mg total) by mouth at bedtime as needed for anxiety or sleep.   [DISCONTINUED] nitrofurantoin , macrocrystal-monohydrate, (MACROBID ) 100 MG capsule Take 1 cap twice per day for 10 days. (Patient not taking: Reported on 06/29/2024)   No facility-administered encounter medications on file as of 07/24/2024.    Surgical History: Past Surgical History:  Procedure Laterality Date   AUGMENTATION MAMMAPLASTY Bilateral 1987   HERNIA REPAIR  1962   skin cancer   08/11/2016   removal   TUBAL LIGATION  08/11/2016    Medical History: Past Medical History:  Diagnosis Date   Asthma    Environmental allergies    GERD (gastroesophageal reflux disease)    Hypertension    Migraine    Sinusitis     Family History: Family History  Problem Relation Age of Onset   Breast cancer Cousin    COPD Mother    Lung cancer Mother    Hypertension Mother    Ulcers Father    Bowel Disease Father        blockage, sepsis    Social History   Socioeconomic History   Marital status: Married    Spouse name: Not on file   Number of children: Not on file   Years of education: Not on file   Highest education level: Not on file  Occupational History   Not on file  Tobacco Use   Smoking status: Former  Current packs/day: 0.00    Types: Cigarettes    Quit date: 12/01/2004    Years since quitting: 19.7   Smokeless tobacco: Never  Vaping Use   Vaping status: Never Used  Substance and Sexual Activity   Alcohol use: Yes    Comment: ocassionally   Drug use: No   Sexual activity: Not on file  Other Topics Concern   Not on file  Social History Narrative   Not on file   Social Drivers of Health   Financial Resource Strain: Not on file  Food Insecurity: Not on file  Transportation Needs: Not on file  Physical Activity: Not on file  Stress: Not on file  Social Connections: Not on file  Intimate Partner Violence: Not on file      Review of Systems  Constitutional:  Negative for  chills, fatigue and unexpected weight change.  HENT:  Positive for congestion, postnasal drip, sinus pressure and voice change. Negative for rhinorrhea, sneezing and sore throat.   Eyes:  Negative for redness.  Respiratory: Negative.  Negative for cough, chest tightness, shortness of breath and wheezing.   Cardiovascular:  Negative for chest pain and palpitations.  Gastrointestinal:  Negative for abdominal pain, constipation, diarrhea, nausea and vomiting.  Genitourinary:  Negative for dysuria and frequency.  Musculoskeletal:  Negative for arthralgias, back pain, joint swelling and neck pain.  Skin:  Negative for rash.  Neurological: Negative.  Negative for tremors and numbness.  Hematological:  Negative for adenopathy. Does not bruise/bleed easily.  Psychiatric/Behavioral:  Positive for sleep disturbance. Negative for behavioral problems (Depression), self-injury and suicidal ideas. The patient is nervous/anxious.     Vital Signs: BP 134/86   Pulse 69   Temp (!) 96 F (35.6 C)   Resp 16   Ht 5' 3 (1.6 m)   Wt 180 lb 9.6 oz (81.9 kg)   SpO2 96%   BMI 31.99 kg/m    Physical Exam Vitals reviewed.  Constitutional:      General: She is not in acute distress.    Appearance: Normal appearance. She is obese. She is not ill-appearing.  HENT:     Head: Normocephalic and atraumatic.     Ears:     Comments: Tympanic membrane is congested right more than left     Nose: Congestion and rhinorrhea present.     Mouth/Throat:     Mouth: Mucous membranes are moist.     Pharynx: Oropharynx is clear. No oropharyngeal exudate or posterior oropharyngeal erythema.  Eyes:     Extraocular Movements: Extraocular movements intact.     Conjunctiva/sclera: Conjunctivae normal.     Pupils: Pupils are equal, round, and reactive to light.  Cardiovascular:     Rate and Rhythm: Normal rate and regular rhythm.     Pulses: Normal pulses.     Heart sounds: Normal heart sounds.  Pulmonary:     Effort:  Pulmonary effort is normal. No respiratory distress.     Breath sounds: Normal breath sounds.  Lymphadenopathy:     Cervical: No cervical adenopathy.  Neurological:     General: No focal deficit present.     Mental Status: She is alert.  Psychiatric:        Mood and Affect: Mood normal.        Behavior: Behavior normal.        Assessment/Plan: 1. Benign paroxysmal positional vertigo due to bilateral vestibular disorder (Primary) Routine labs ordered  - CBC with Differential/Platelet - CMP14+EGFR - Lipid Profile -  Vitamin D  (25 hydroxy) - B12 and Folate Panel - TSH + free T4  2. Hair loss Routine labs ordered  - CBC with Differential/Platelet - CMP14+EGFR - Lipid Profile - Vitamin D  (25 hydroxy) - B12 and Folate Panel - TSH + free T4  3. Non-seasonal allergic rhinitis due to pollen Continue flonase  as prescribed  - fluticasone  (FLONASE ) 50 MCG/ACT nasal spray; Place 2 sprays into both nostrils daily.  Dispense: 16 g; Refill: 6  4. Mixed hyperlipidemia Routine labs ordered  - CBC with Differential/Platelet - CMP14+EGFR - Lipid Profile - Vitamin D  (25 hydroxy) - B12 and Folate Panel - TSH + free T4  5. B12 deficiency Routine labs ordered  - CBC with Differential/Platelet - CMP14+EGFR - Lipid Profile - Vitamin D  (25 hydroxy) - B12 and Folate Panel - TSH + free T4  6. Vitamin D  deficiency Routine labs ordered  - CBC with Differential/Platelet - CMP14+EGFR - Lipid Profile - Vitamin D  (25 hydroxy) - B12 and Folate Panel - TSH + free T4  7. Generalized anxiety disorder Continue prn lorazepam  as prescribed. Follow up in 3 months for additional refills - LORazepam  (ATIVAN ) 1 MG tablet; Take 1 tablet (1 mg total) by mouth at bedtime as needed for anxiety or sleep.  Dispense: 30 tablet; Refill: 2   General Counseling: Seara verbalizes understanding of the findings of todays visit and agrees with plan of treatment. I have discussed any further diagnostic  evaluation that may be needed or ordered today. We also reviewed her medications today. she has been encouraged to call the office with any questions or concerns that should arise related to todays visit.    Orders Placed This Encounter  Procedures   CBC with Differential/Platelet   CMP14+EGFR   Lipid Profile   Vitamin D  (25 hydroxy)   B12 and Folate Panel   TSH + free T4    Meds ordered this encounter  Medications   LORazepam  (ATIVAN ) 1 MG tablet    Sig: Take 1 tablet (1 mg total) by mouth at bedtime as needed for anxiety or sleep.    Dispense:  30 tablet    Refill:  2    For future refills, due now   fluticasone  (FLONASE ) 50 MCG/ACT nasal spray    Sig: Place 2 sprays into both nostrils daily.    Dispense:  16 g    Refill:  6    Return in about 3 months (around 10/17/2024) for F/U, anxiety med refill, Beniah Magnan PCP.   Total time spent:30 Minutes Time spent includes review of chart, medications, test results, and follow up plan with the patient.   [place] Controlled Substance Database was reviewed by me.  This patient was seen by Mardy Maxin, FNP-C in collaboration with Dr. Sigrid Bathe as a part of collaborative care agreement.   Mikaela Hilgeman R. Maxin, MSN, FNP-C Internal medicine

## 2024-07-28 ENCOUNTER — Ambulatory Visit

## 2024-07-31 ENCOUNTER — Other Ambulatory Visit: Payer: Self-pay | Admitting: Podiatry

## 2024-07-31 DIAGNOSIS — M2011 Hallux valgus (acquired), right foot: Secondary | ICD-10-CM | POA: Diagnosis not present

## 2024-07-31 DIAGNOSIS — M21611 Bunion of right foot: Secondary | ICD-10-CM | POA: Diagnosis not present

## 2024-07-31 DIAGNOSIS — M205X1 Other deformities of toe(s) (acquired), right foot: Secondary | ICD-10-CM | POA: Diagnosis not present

## 2024-07-31 DIAGNOSIS — G8918 Other acute postprocedural pain: Secondary | ICD-10-CM | POA: Diagnosis not present

## 2024-07-31 MED ORDER — ACETAMINOPHEN 500 MG PO TABS: 1000.0000 mg | ORAL_TABLET | Freq: Four times a day (QID) | ORAL | 0 refills | Status: AC | PRN

## 2024-07-31 MED ORDER — OXYCODONE HCL 5 MG PO TABS: 5.0000 mg | ORAL_TABLET | ORAL | 0 refills | Status: AC | PRN

## 2024-07-31 MED ORDER — ASPIRIN 81 MG PO TBEC: 81.0000 mg | DELAYED_RELEASE_TABLET | Freq: Two times a day (BID) | ORAL | 0 refills | Status: AC

## 2024-07-31 MED ORDER — GABAPENTIN 300 MG PO CAPS: 300.0000 mg | ORAL_CAPSULE | Freq: Three times a day (TID) | ORAL | 0 refills | Status: DC

## 2024-07-31 MED ORDER — IBUPROFEN 600 MG PO TABS: 600.0000 mg | ORAL_TABLET | Freq: Three times a day (TID) | ORAL | 0 refills | Status: AC | PRN

## 2024-08-02 ENCOUNTER — Telehealth: Payer: Self-pay | Admitting: Podiatry

## 2024-08-02 ENCOUNTER — Encounter: Admitting: Podiatry

## 2024-08-02 NOTE — Telephone Encounter (Signed)
 Concerned that when wife had bunion surgery before they changed the bandages within 5 days now this time it is being change within 9 days pt husband wants to know why shouldn't it be 5 days Please call pt @[phone] Please advise

## 2024-08-03 ENCOUNTER — Telehealth: Payer: Self-pay | Admitting: Podiatry

## 2024-08-03 NOTE — Telephone Encounter (Signed)
 Error

## 2024-08-03 NOTE — Telephone Encounter (Deleted)
 Patient reports that her foot is throbbing while in the boot. The throbbing stops when the boot is loosened, but resumes when she tightens it. DOS 07/31/24. First POV #1 is scheduled for 08/09/24. She is requesting to be seen sooner. Please advise.

## 2024-08-07 ENCOUNTER — Ambulatory Visit (INDEPENDENT_AMBULATORY_CARE_PROVIDER_SITE_OTHER)

## 2024-08-07 ENCOUNTER — Ambulatory Visit: Admitting: Podiatry

## 2024-08-07 DIAGNOSIS — M21611 Bunion of right foot: Secondary | ICD-10-CM

## 2024-08-07 DIAGNOSIS — M2011 Hallux valgus (acquired), right foot: Secondary | ICD-10-CM | POA: Diagnosis not present

## 2024-08-07 DIAGNOSIS — M898X7 Other specified disorders of bone, ankle and foot: Secondary | ICD-10-CM

## 2024-08-09 ENCOUNTER — Encounter: Admitting: Podiatry

## 2024-08-09 NOTE — Progress Notes (Signed)
  Subjective:  Patient ID: Paula [name], female    DOB: 09-25-1955,  MRN: [id number]  Chief Complaint  Patient presents with   Post-op Problem    Patient reports now that her foot is throbbing while in the boot. The throbbing stops when the boot is loosened, but resumes when she tightens it. DOS  07/31/24 - LAPIDUS PROCEDURE INCLUDING BUNIONECTOMY RT- [id number] KATRINA OSTEOTOMY RT- [phone]         68 y.o. female returns for post-op check.  Doing well no issues  Review of Systems: Negative except as noted in the HPI. Denies N/V/F/Ch.   Objective:  There were no vitals filed for this visit. There is no height or weight on file to calculate BMI. Constitutional Well developed. Well nourished.  Vascular Foot warm and well perfused. Capillary refill normal to all digits.  Calf is soft and supple, no posterior calf or knee pain, negative Homans' sign  Neurologic Normal speech. Oriented to person, place, and time. Epicritic sensation to light touch grossly present bilaterally.  Dermatologic Skin healing well without signs of infection. Skin edges well coapted without signs of infection.  Orthopedic: Tenderness to palpation noted about the surgical site.   Multiple view plain film radiographs: Good correction noted with hardware intact and in position equivalent to immediate postoperative films Assessment:   1. Hallux valgus with bunions of right foot   2. Exostosis of right foot    Plan:  Patient was evaluated and treated and all questions answered.  S/p foot surgery right -Progressing as expected post-operatively. -XR: Noted above no issues -WB Status: Nonweightbearing in cam walker boot -Sutures: Return in 9 days for removal. -Medications: No refills required -Foot redressed.  Return in about 9 days (around 08/16/2024) for post op (no x-rays), suture removal.

## 2024-08-16 ENCOUNTER — Ambulatory Visit: Admitting: Podiatry

## 2024-08-16 DIAGNOSIS — M2011 Hallux valgus (acquired), right foot: Secondary | ICD-10-CM

## 2024-08-16 DIAGNOSIS — M21611 Bunion of right foot: Secondary | ICD-10-CM

## 2024-08-17 NOTE — Progress Notes (Signed)
  Subjective:  Patient ID: Paula [name], female    DOB: 11-29-1955,  MRN: [id number]  Chief Complaint  Patient presents with   Routine Post Op    Bunions surgery      68 y.o. female returns for post-op check.  Doing well no issues  Review of Systems: Negative except as noted in the HPI. Denies N/V/F/Ch.   Objective:  There were no vitals filed for this visit. There is no height or weight on file to calculate BMI. Constitutional Well developed. Well nourished.  Vascular Foot warm and well perfused. Capillary refill normal to all digits.  Calf is soft and supple, no posterior calf or knee pain, negative Homans' sign  Neurologic Normal speech. Oriented to person, place, and time. Epicritic sensation to light touch grossly present bilaterally.  Dermatologic Skin healing well without signs of infection.  There is some superficial slough and delayed healing of the main incision  Orthopedic: Tenderness to palpation noted about the surgical site.   Multiple view plain film radiographs: Good correction noted with hardware intact and in position equivalent to immediate postoperative films Assessment:   1. Hallux valgus with bunions of right foot    Plan:  Patient was evaluated and treated and all questions answered.  S/p foot surgery right - Sutures removed uneventfully.  Some delayed healing of the main incision from the absorbable suture.  Applied Neosporin daily.  Compression sleeve applied.  Weightbearing as tolerated in cam boot at this point.  Return in 3 weeks for new x-rays and may be able to transition to a surgical shoe at this point No follow-ups on file.

## 2024-08-21 ENCOUNTER — Encounter: Payer: Self-pay | Admitting: Nurse Practitioner

## 2024-08-23 ENCOUNTER — Encounter: Admitting: Podiatry

## 2024-08-24 ENCOUNTER — Telehealth: Payer: Self-pay | Admitting: Nurse Practitioner

## 2024-08-24 NOTE — Telephone Encounter (Signed)
 Otolaryngology referral sent via [facility] to [facility]. Notified patient. Gave patient telephone # [phone]

## 2024-09-12 ENCOUNTER — Telehealth: Admitting: Internal Medicine

## 2024-09-12 VITALS — Temp 99.5°F | Ht 63.0 in | Wt 185.0 lb

## 2024-09-12 DIAGNOSIS — J45909 Unspecified asthma, uncomplicated: Secondary | ICD-10-CM | POA: Diagnosis not present

## 2024-09-12 MED ORDER — AZITHROMYCIN 250 MG PO TABS: ORAL_TABLET | ORAL | 0 refills | Status: DC

## 2024-09-12 NOTE — Progress Notes (Signed)
 [facility] [address] [place], [place] [address]  Internal MEDICINE  Telephone Visit  Patient Name: Paula [name]  [id number]  [id number]  Date of Service: 09/28/2024  I connected with the patient at 1015 by telephone and verified the patients identity using two identifiers.   I discussed the limitations, risks, security and privacy concerns of performing an evaluation and management service by telephone and the availability of in person appointments. I also discussed with the patient that there may be a patient responsible charge related to the service.  The patient expressed understanding and agrees to proceed.    Chief Complaint  Patient presents with   Telephone Screen    [phone]    Sinusitis    Covid test is negative    Sore Throat    Start last night    Cough    HPI  Pt is seen for acute visit Pt has sore throat, right ear ache Mild cough and low grade fever  Chest feels tight as well    Current Medication: Outpatient Encounter Medications as of 09/12/2024  Medication Sig   azithromycin  (ZITHROMAX ) 250 MG tablet Take one tab a day for 10 days for uri   celecoxib  (CELEBREX ) 100 MG capsule Take 1 capsule (100 mg total) by mouth 2 (two) times daily.   EPINEPHrine  0.3 mg/0.3 mL IJ SOAJ injection Inject 0.3 mg into the muscle as needed for anaphylaxis.   fexofenadine-pseudoephedrine (ALLEGRA-D 24) 180-240 MG 24 hr tablet Take 1 tablet by mouth daily.   fluticasone  (FLONASE ) 50 MCG/ACT nasal spray Place 2 sprays into both nostrils daily.   LORazepam  (ATIVAN ) 1 MG tablet Take 1 tablet (1 mg total) by mouth at bedtime as needed for anxiety or sleep.   meclizine  (ANTIVERT ) 12.5 MG tablet One tab po tid prn for dizziness   mupirocin  ointment (BACTROBAN ) 2 % Apply 1 Application topically 2 (two) times daily as needed.   triamcinolone  cream (KENALOG ) 0.1 % Apply 1 Application topically 2 (two) times daily as needed (itchy insect stings).   [DISCONTINUED]  gabapentin  (NEURONTIN ) 300 MG capsule Take 1 capsule (300 mg total) by mouth 3 (three) times daily for 7 days.   [DISCONTINUED] predniSONE  (DELTASONE ) 10 MG tablet Take one tab 3 x day for 3 days, then take one tab 2 x a day for 3 days and then take one tab a day for 3 days for copd   No facility-administered encounter medications on file as of 09/12/2024.    Surgical History: Past Surgical History:  Procedure Laterality Date   AUGMENTATION MAMMAPLASTY Bilateral 1987   HERNIA REPAIR  1962   skin cancer   08/11/2016   removal   TUBAL LIGATION  08/11/2016    Medical History: Past Medical History:  Diagnosis Date   Asthma    Environmental allergies    GERD (gastroesophageal reflux disease)    Hypertension    Migraine    Sinusitis     Family History: Family History  Problem Relation Age of Onset   Breast cancer Cousin    COPD Mother    Lung cancer Mother    Hypertension Mother    Ulcers Father    Bowel Disease Father        blockage, sepsis    Social History   Socioeconomic History   Marital status: Married    Spouse name: Not on file   Number of children: Not on file   Years of education: Not on file   Highest education level:  Not on file  Occupational History   Not on file  Tobacco Use   Smoking status: Former    Current packs/day: 0.00    Types: Cigarettes    Quit date: 12/01/2004    Years since quitting: 19.8   Smokeless tobacco: Never  Vaping Use   Vaping status: Never Used  Substance and Sexual Activity   Alcohol use: Yes    Comment: ocassionally   Drug use: No   Sexual activity: Not on file  Other Topics Concern   Not on file  Social History Narrative   Not on file   Social Drivers of Health   Tobacco Use: Medium Risk (08/21/2024)   Patient History    Smoking Tobacco Use: Former    Smokeless Tobacco Use: Never    Passive Exposure: Not on Actuary Strain: Not on file  Food Insecurity: Not on file  Transportation Needs: Not  on file  Physical Activity: Not on file  Stress: Not on file  Social Connections: Not on file  Intimate Partner Violence: Not on file  Depression (PHQ2-9): Low Risk (10/28/2023)   Depression (PHQ2-9)    PHQ-2 Score: 0  Alcohol Screen: Low Risk (08/02/2023)   Alcohol Screen    Last Alcohol Screening Score (AUDIT): 2  Housing: Not on file  Utilities: Not on file  Health Literacy: Not on file      Review of Systems  Constitutional:  Negative for fatigue and fever.  HENT:  Negative for congestion, mouth sores and postnasal drip.   Respiratory:  Positive for cough.   Cardiovascular:  Negative for chest pain.  Genitourinary:  Negative for flank pain.  Psychiatric/Behavioral:  Negative for hallucinations.     Vital Signs: Temp 99.5 F (37.5 C)   Ht 5' 3 (1.6 m)   Wt 185 lb (83.9 kg)   BMI 32.77 kg/m    Observation/Objective: Congested but no NAD     Assessment/Plan: 1. Acute asthmatic bronchitis (Primary) 1. Acute asthmatic bronchitis (Primary) Will go ahead and treat as prescribed, pt was instructed to get CXR if no improvment seen in 2 days  - azithromycin  (ZITHROMAX ) 250 MG tablet; Take one tab a day for 10 days for uri  Dispense: 10 tablet; Refill: 0 - DG Chest 2 View; Future   General Counseling: Paula [name] understanding of the findings of today's phone visit and agrees with plan of treatment. I have discussed any further diagnostic evaluation that may be needed or ordered today. We also reviewed her medications today. she has been encouraged to call the office with any questions or concerns that should arise related to todays visit.    Orders Placed This Encounter  Procedures   DG Chest 2 View    Meds ordered this encounter  Medications   azithromycin  (ZITHROMAX ) 250 MG tablet    Sig: Take one tab a day for 10 days for uri    Dispense:  10 tablet    Refill:  0    Time spent:15 Minutes    Dr Sigrid CHRISTELLA Bathe Internal medicine

## 2024-09-13 ENCOUNTER — Ambulatory Visit (INDEPENDENT_AMBULATORY_CARE_PROVIDER_SITE_OTHER): Admitting: Podiatry

## 2024-09-13 ENCOUNTER — Ambulatory Visit (INDEPENDENT_AMBULATORY_CARE_PROVIDER_SITE_OTHER)

## 2024-09-13 VITALS — Ht 63.0 in | Wt 185.0 lb

## 2024-09-13 DIAGNOSIS — M21611 Bunion of right foot: Secondary | ICD-10-CM

## 2024-09-13 DIAGNOSIS — M2011 Hallux valgus (acquired), right foot: Secondary | ICD-10-CM

## 2024-09-13 NOTE — Progress Notes (Signed)
"  °  Subjective:  Patient ID: Paula [name], female    DOB: [birth year],  MRN: [id number]  Chief Complaint  Patient presents with   Post-op Follow-up    RM 1 POV # 3 DOS 07/14/24 RT FOOT BUNION CORRECTION( LAPIPLASTY). Pt states intermittent shooting pain and swelling in right foot. Small visible sanguineous drainage from suture site.      68 y.o. female returns for post-op check.  Doing well no issues  Review of Systems: Negative except as noted in the HPI. Denies N/V/F/Ch.   Objective:  There were no vitals filed for this visit. Body mass index is 32.77 kg/m. Constitutional Well developed. Well nourished.  Vascular Foot warm and well perfused. Capillary refill normal to all digits.  Calf is soft and supple, no posterior calf or knee pain, negative Homans' sign  Neurologic Normal speech. Oriented to person, place, and time. Epicritic sensation to light touch grossly present bilaterally.  Dermatologic Still some Scab present but improved significantly and is nearly fully healed  Orthopedic: She has minimal pain to palpation or edema to palpation noted about the surgical site.   Multiple view plain film radiographs: There is good consolidation across fusion and osteotomy sites Assessment:   1. Hallux valgus with bunions of right foot    Plan:  Patient was evaluated and treated and all questions answered.  S/p foot surgery right - Doing very well.  Continue ice elevation and compression sleeve for edema.  Continue range of motion of toe.  Okay to transition to surgical shoe for the next couple weeks and then gradual shoe gear as tolerated.  Return in 6 weeks for new x-rays. No follow-ups on file.  "

## 2024-09-21 ENCOUNTER — Other Ambulatory Visit: Payer: Self-pay | Admitting: Podiatry

## 2024-09-26 ENCOUNTER — Ambulatory Visit (INDEPENDENT_AMBULATORY_CARE_PROVIDER_SITE_OTHER)

## 2024-09-26 ENCOUNTER — Ambulatory Visit: Admitting: Podiatry

## 2024-09-26 VITALS — Ht 63.0 in | Wt 185.0 lb

## 2024-09-26 DIAGNOSIS — M21611 Bunion of right foot: Secondary | ICD-10-CM

## 2024-09-26 DIAGNOSIS — M2011 Hallux valgus (acquired), right foot: Secondary | ICD-10-CM | POA: Diagnosis not present

## 2024-09-26 MED ORDER — TRAMADOL HCL 50 MG PO TABS: 50.0000 mg | ORAL_TABLET | Freq: Four times a day (QID) | ORAL | 0 refills | Status: AC | PRN

## 2024-09-28 NOTE — Progress Notes (Signed)
"  °  Subjective:  Patient ID: Paula [name], female    DOB: 19-Jan-1956,  MRN: [id number]  Chief Complaint  Patient presents with   Post-op Follow-up    Rm 3 Patient is here to f/u on post-op right foot bunion correction. Pt states continued pain and swelling int he right foot. Pt states a  small knot in between the right 1st and 2nd toe.      69 y.o. female returns for post-op check.  Has had increased pain since returning to the surgical shoe  Review of Systems: Negative except as noted in the HPI. Denies N/V/F/Ch.   Objective:  There were no vitals filed for this visit. Body mass index is 32.77 kg/m. Constitutional Well developed. Well nourished.  Vascular Foot warm and well perfused. Capillary refill normal to all digits.  Calf is soft and supple, no posterior calf or knee pain, negative Homans' sign  Neurologic Normal speech. Oriented to person, place, and time. Epicritic sensation to light touch grossly present bilaterally.  Dermatologic Incision is fully healed with minimal hypertrophy  Orthopedic: Minimal pain to palpation there is moderate edema.   Multiple view plain film radiographs: Osteotomy has good consolidation there is still visible fusion site at the first TMT Assessment:   1. Hallux valgus with bunions of right foot    Plan:  Patient was evaluated and treated and all questions answered.  S/p foot surgery right - Discussed with her transition to the surgical shoe may have been too rapid and early.  Would like her to return to the cam boot for another 1 to 2 weeks as tolerated and then may trial the surgical shoe again.  Still minimize amount of time and activity on foot.  No weightbearing outside of protective boot/surgical shoe but may remove when resting.  Follow-up with me in 4 weeks for new x-rays.  Rx tramadol  sent to pharmacy for pain control.  Return in about 4 weeks (around 10/24/2024) for post op (new x-rays).  "

## 2024-09-29 ENCOUNTER — Other Ambulatory Visit: Payer: Self-pay | Admitting: Podiatry

## 2024-09-30 ENCOUNTER — Other Ambulatory Visit: Payer: Self-pay | Admitting: Podiatry

## 2024-10-16 ENCOUNTER — Ambulatory Visit: Admitting: Podiatry

## 2024-10-16 ENCOUNTER — Other Ambulatory Visit: Payer: Self-pay

## 2024-10-16 ENCOUNTER — Telehealth: Payer: Self-pay

## 2024-10-16 MED ORDER — AZITHROMYCIN 250 MG PO TABS: ORAL_TABLET | ORAL | 0 refills | Status: AC

## 2024-10-16 MED ORDER — AZITHROMYCIN 250 MG PO TABS: ORAL_TABLET | ORAL | 0 refills | Status: DC

## 2024-10-16 NOTE — Telephone Encounter (Signed)
 Pt called that she coughing ,headache and sinusitis and low grade fever  going on for 2 days she did Covid and flu test is negative as per dr advised her that we sent ZPak and take OTC Mucinex for cough

## 2024-10-20 ENCOUNTER — Ambulatory Visit: Admitting: Nurse Practitioner

## 2024-10-20 ENCOUNTER — Encounter: Payer: Self-pay | Admitting: Nurse Practitioner

## 2024-10-20 VITALS — BP 144/80 | HR 76 | Temp 95.7°F | Resp 16 | Ht 63.0 in | Wt 181.4 lb

## 2024-10-20 DIAGNOSIS — G6289 Other specified polyneuropathies: Secondary | ICD-10-CM

## 2024-10-20 DIAGNOSIS — J0110 Acute frontal sinusitis, unspecified: Secondary | ICD-10-CM

## 2024-10-20 DIAGNOSIS — R051 Acute cough: Secondary | ICD-10-CM

## 2024-10-20 MED ORDER — OXYCODONE HCL 5 MG PO TABS: 5.0000 mg | ORAL_TABLET | ORAL | 0 refills | Status: AC | PRN

## 2024-10-20 MED ORDER — AMOXICILLIN-POT CLAVULANATE 875-125 MG PO TABS: 1.0000 | ORAL_TABLET | Freq: Two times a day (BID) | ORAL | 0 refills | Status: AC

## 2024-10-20 MED ORDER — PROMETHAZINE-DM 6.25-15 MG/5ML PO SYRP: 5.0000 mL | ORAL_SOLUTION | Freq: Four times a day (QID) | ORAL | 0 refills | Status: AC | PRN

## 2024-10-20 NOTE — Progress Notes (Cosign Needed)
 [facility] [address] [place], [place] [address]  Internal MEDICINE  Office Visit Note  Patient Name: Paula [name]  [id number]  [id number]  Date of Service: 10/20/2024  Chief Complaint  Patient presents with   Acute Visit    Sinus infection     HPI Paula [name] presents for an acute sick visit for sinusitis --onset of symptoms was about 7 days ago. She called the clinic on 10/16/24 and a zpak was sent. Mucinex and zpak has not been effective.  She reports low grade fever, headache, cough, sinus drainage, runny nose, nasal congestion, sore throat, chills, fatigue, body aches, and ear pain.  She has upcoming annual wellness visit next week, wants to check her magnesium  level.  Having neuropathy in her feet s/p surgery on both feet. Last surgery was in November last year. Reports significant pain and that tramadol  is not helping.     Current Medication:  Outpatient Encounter Medications as of 10/20/2024  Medication Sig   amoxicillin -clavulanate (AUGMENTIN ) 875-125 MG tablet Take 1 tablet by mouth 2 (two) times daily for 10 days. May Take with food   oxyCODONE  (OXY IR/ROXICODONE ) 5 MG immediate release tablet Take 1 tablet (5 mg total) by mouth every 4 (four) hours as needed for up to 5 days for severe pain (pain score 7-10).   promethazine -dextromethorphan (PROMETHAZINE -DM) 6.25-15 MG/5ML syrup Take 5 mLs by mouth 4 (four) times daily as needed for cough.   azithromycin  (ZITHROMAX ) 250 MG tablet Use as directed for 5 days   celecoxib  (CELEBREX ) 100 MG capsule Take 1 capsule (100 mg total) by mouth 2 (two) times daily.   EPINEPHrine  0.3 mg/0.3 mL IJ SOAJ injection Inject 0.3 mg into the muscle as needed for anaphylaxis.   fexofenadine-pseudoephedrine (ALLEGRA-D 24) 180-240 MG 24 hr tablet Take 1 tablet by mouth daily.   fluticasone  (FLONASE ) 50 MCG/ACT nasal spray Place 2 sprays into both nostrils daily.   gabapentin  (NEURONTIN ) 300 MG capsule TAKE 1 CAPSULE BY MOUTH 3 TIMES  DAILY FOR 7 DAYS   LORazepam  (ATIVAN ) 1 MG tablet Take 1 tablet (1 mg total) by mouth at bedtime as needed for anxiety or sleep.   meclizine  (ANTIVERT ) 12.5 MG tablet One tab po tid prn for dizziness   mupirocin  ointment (BACTROBAN ) 2 % Apply 1 Application topically 2 (two) times daily as needed.   triamcinolone  cream (KENALOG ) 0.1 % Apply 1 Application topically 2 (two) times daily as needed (itchy insect stings).   No facility-administered encounter medications on file as of 10/20/2024.      Medical History: Past Medical History:  Diagnosis Date   Asthma    Environmental allergies    GERD (gastroesophageal reflux disease)    Hypertension    Migraine    Sinusitis      Vital Signs: BP (!) 144/80   Pulse 76   Temp (!) 95.7 F (35.4 C)   Resp 16   Ht 5' 3 (1.6 m)   Wt 181 lb 6.4 oz (82.3 kg)   SpO2 93%   BMI 32.13 kg/m    Review of Systems  Constitutional:  Positive for chills, fatigue and fever.  HENT:  Positive for congestion, ear pain, postnasal drip, rhinorrhea, sinus pressure, sinus pain and sore throat.   Respiratory:  Positive for cough and wheezing. Negative for chest tightness and shortness of breath.   Cardiovascular: Negative.  Negative for chest pain and palpitations.  Gastrointestinal: Negative.   Musculoskeletal:  Positive for arthralgias and myalgias.  Neurological:  Positive  for headaches.    Physical Exam Vitals reviewed.  Constitutional:      Appearance: Normal appearance. She is obese. She is ill-appearing.  HENT:     Head: Normocephalic and atraumatic.     Right Ear: Tympanic membrane, ear canal and external ear normal.     Left Ear: Tympanic membrane, ear canal and external ear normal.     Nose: Mucosal edema, congestion and rhinorrhea present.     Right Turbinates: Swollen.     Left Turbinates: Swollen.     Right Sinus: Frontal sinus tenderness present. No maxillary sinus tenderness.     Left Sinus: Frontal sinus tenderness present. No  maxillary sinus tenderness.     Mouth/Throat:     Mouth: Mucous membranes are moist.     Pharynx: Posterior oropharyngeal erythema present.  Eyes:     Pupils: Pupils are equal, round, and reactive to light.  Cardiovascular:     Rate and Rhythm: Normal rate and regular rhythm.     Heart sounds: Normal heart sounds. No murmur heard. Pulmonary:     Effort: Pulmonary effort is normal. No respiratory distress.     Breath sounds: Normal breath sounds. No stridor. No wheezing.  Neurological:     Mental Status: She is alert.  Psychiatric:        Mood and Affect: Mood normal.        Behavior: Behavior normal.       Assessment/Plan: 1. Acute non-recurrent frontal sinusitis (Primary) Augmentin  prescribed, take until gone.  - amoxicillin -clavulanate (AUGMENTIN ) 875-125 MG tablet; Take 1 tablet by mouth 2 (two) times daily for 10 days. May Take with food  Dispense: 20 tablet; Refill: 0  2. Acute cough Cough medication prescribed, take as needed  - promethazine -dextromethorphan (PROMETHAZINE -DM) 6.25-15 MG/5ML syrup; Take 5 mLs by mouth 4 (four) times daily as needed for cough.  Dispense: 180 mL; Refill: 0  3. Other polyneuropathy Routine lab ordered. Prn oxycodone  prescribed, take as ordered.  - oxyCODONE  (OXY IR/ROXICODONE ) 5 MG immediate release tablet; Take 1 tablet (5 mg total) by mouth every 4 (four) hours as needed for up to 5 days for severe pain (pain score 7-10).  Dispense: 30 tablet; Refill: 0 - Magnesium    General Counseling: Paula [name] verbalizes understanding of the findings of todays visit and agrees with plan of treatment. I have discussed any further diagnostic evaluation that may be needed or ordered today. We also reviewed her medications today. she has been encouraged to call the office with any questions or concerns that should arise related to todays visit.    Counseling:    Orders Placed This Encounter  Procedures   Magnesium     Meds ordered this encounter   Medications   amoxicillin -clavulanate (AUGMENTIN ) 875-125 MG tablet    Sig: Take 1 tablet by mouth 2 (two) times daily for 10 days. May Take with food    Dispense:  20 tablet    Refill:  0    Fill new script today.   promethazine -dextromethorphan (PROMETHAZINE -DM) 6.25-15 MG/5ML syrup    Sig: Take 5 mLs by mouth 4 (four) times daily as needed for cough.    Dispense:  180 mL    Refill:  0    Fill new script today   oxyCODONE  (OXY IR/ROXICODONE ) 5 MG immediate release tablet    Sig: Take 1 tablet (5 mg total) by mouth every 4 (four) hours as needed for up to 5 days for severe pain (pain score 7-10).    Dispense:  30 tablet    Refill:  0    Fill new script today    Return if symptoms worsen or fail to improve, for previously scheduled, [facility], Paula [name] PCP in 10 days. .  [place] Controlled Substance Database was reviewed by me for overdose risk score (ORS)  Time spent:30 Minutes Time spent with patient included reviewing progress notes, labs, imaging studies, and discussing plan for follow up.   This patient was seen by Mardy Maxin, FNP-C in collaboration with Dr. Sigrid Bathe as a part of collaborative care agreement.  Cienna Dumais R. Maxin, MSN, FNP-C Internal Medicine

## 2024-10-25 ENCOUNTER — Ambulatory Visit: Admitting: Podiatry

## 2024-10-30 ENCOUNTER — Ambulatory Visit: Payer: Medicare HMO | Admitting: Nurse Practitioner

## 2024-10-30 ENCOUNTER — Ambulatory Visit: Admitting: Podiatry
